# Patient Record
Sex: Male | Born: 1975
Health system: Southern US, Community
[De-identification: ages and names within clinical notes are randomized; demographics above are authoritative.]

## PROBLEM LIST (undated history)

## (undated) DIAGNOSIS — M1990 Unspecified osteoarthritis, unspecified site: Secondary | ICD-10-CM

## (undated) DIAGNOSIS — E785 Hyperlipidemia, unspecified: Secondary | ICD-10-CM

## (undated) DIAGNOSIS — I10 Essential (primary) hypertension: Secondary | ICD-10-CM

## (undated) DIAGNOSIS — G4730 Sleep apnea, unspecified: Secondary | ICD-10-CM

## (undated) DIAGNOSIS — E119 Type 2 diabetes mellitus without complications: Secondary | ICD-10-CM

## (undated) HISTORY — DX: Hyperlipidemia, unspecified: E78.5

## (undated) HISTORY — DX: Essential (primary) hypertension: I10

## (undated) HISTORY — DX: Type 2 diabetes mellitus without complications: E11.9

---

## 2004-02-15 ENCOUNTER — Emergency Department (HOSPITAL_COMMUNITY): Admission: EM | Admit: 2004-02-15 | Discharge: 2004-02-15 | Payer: Self-pay | Admitting: Family Medicine

## 2006-10-19 ENCOUNTER — Emergency Department (HOSPITAL_COMMUNITY): Admission: EM | Admit: 2006-10-19 | Discharge: 2006-10-19 | Payer: Self-pay | Admitting: Emergency Medicine

## 2007-08-04 HISTORY — PX: KNEE SURGERY: SHX244

## 2007-09-18 ENCOUNTER — Emergency Department (HOSPITAL_COMMUNITY): Admission: EM | Admit: 2007-09-18 | Discharge: 2007-09-18 | Payer: Self-pay | Admitting: Family Medicine

## 2008-01-21 ENCOUNTER — Emergency Department (HOSPITAL_COMMUNITY): Admission: EM | Admit: 2008-01-21 | Discharge: 2008-01-21 | Payer: Self-pay | Admitting: Emergency Medicine

## 2008-01-26 ENCOUNTER — Emergency Department (HOSPITAL_COMMUNITY): Admission: EM | Admit: 2008-01-26 | Discharge: 2008-01-26 | Payer: Self-pay | Admitting: Family Medicine

## 2008-10-08 ENCOUNTER — Encounter (INDEPENDENT_AMBULATORY_CARE_PROVIDER_SITE_OTHER): Payer: Self-pay | Admitting: *Deleted

## 2008-10-16 ENCOUNTER — Ambulatory Visit: Payer: Self-pay | Admitting: Family Medicine

## 2008-11-07 LAB — CONVERTED CEMR LAB
Alkaline Phosphatase: 60 units/L (ref 39–117)
BUN: 10 mg/dL (ref 6–23)
Basophils Absolute: 0 10*3/uL (ref 0.0–0.1)
Basophils Relative: 0.2 % (ref 0.0–3.0)
Bilirubin, Direct: 0 mg/dL (ref 0.0–0.3)
CO2: 31 meq/L (ref 19–32)
Calcium: 9.2 mg/dL (ref 8.4–10.5)
Chloride: 107 meq/L (ref 96–112)
Cholesterol: 183 mg/dL (ref 0–200)
Creatinine, Ser: 1 mg/dL (ref 0.4–1.5)
Eosinophils Absolute: 0.2 10*3/uL (ref 0.0–0.7)
HDL: 39.4 mg/dL (ref >39.00)
Lymphocytes Relative: 27.3 % (ref 12.0–46.0)
MCHC: 33.5 g/dL (ref 30.0–36.0)
MCV: 98.5 fL (ref 78.0–100.0)
Monocytes Absolute: 0.6 10*3/uL (ref 0.1–1.0)
Neutrophils Relative %: 64.5 % (ref 43.0–77.0)
Platelets: 223 10*3/uL (ref 150.0–400.0)
RDW: 12 % (ref 11.5–14.6)
Total Bilirubin: 0.9 mg/dL (ref 0.3–1.2)
Total CHOL/HDL Ratio: 5
Total Protein: 7.1 g/dL (ref 6.0–8.3)
Triglycerides: 82 mg/dL (ref 0.0–149.0)

## 2008-11-08 ENCOUNTER — Encounter (INDEPENDENT_AMBULATORY_CARE_PROVIDER_SITE_OTHER): Payer: Self-pay | Admitting: *Deleted

## 2009-01-22 ENCOUNTER — Ambulatory Visit: Payer: Self-pay | Admitting: Family Medicine

## 2009-01-22 DIAGNOSIS — R519 Headache, unspecified: Secondary | ICD-10-CM | POA: Insufficient documentation

## 2009-01-22 DIAGNOSIS — R51 Headache: Secondary | ICD-10-CM

## 2009-01-22 DIAGNOSIS — J018 Other acute sinusitis: Secondary | ICD-10-CM

## 2009-01-25 ENCOUNTER — Ambulatory Visit: Payer: Self-pay | Admitting: Cardiology

## 2009-01-28 ENCOUNTER — Telehealth (INDEPENDENT_AMBULATORY_CARE_PROVIDER_SITE_OTHER): Payer: Self-pay | Admitting: *Deleted

## 2009-02-22 ENCOUNTER — Telehealth (INDEPENDENT_AMBULATORY_CARE_PROVIDER_SITE_OTHER): Payer: Self-pay | Admitting: *Deleted

## 2009-03-06 ENCOUNTER — Ambulatory Visit: Payer: Self-pay | Admitting: Family Medicine

## 2009-03-06 DIAGNOSIS — I10 Essential (primary) hypertension: Secondary | ICD-10-CM | POA: Insufficient documentation

## 2009-03-06 DIAGNOSIS — G4730 Sleep apnea, unspecified: Secondary | ICD-10-CM | POA: Insufficient documentation

## 2009-03-19 ENCOUNTER — Ambulatory Visit: Payer: Self-pay | Admitting: Pulmonary Disease

## 2009-03-19 DIAGNOSIS — G4733 Obstructive sleep apnea (adult) (pediatric): Secondary | ICD-10-CM | POA: Insufficient documentation

## 2009-04-11 ENCOUNTER — Encounter: Payer: Self-pay | Admitting: Pulmonary Disease

## 2009-04-11 ENCOUNTER — Ambulatory Visit (HOSPITAL_BASED_OUTPATIENT_CLINIC_OR_DEPARTMENT_OTHER): Admission: RE | Admit: 2009-04-11 | Discharge: 2009-04-11 | Payer: Self-pay | Admitting: Pulmonary Disease

## 2009-04-24 ENCOUNTER — Ambulatory Visit: Payer: Self-pay | Admitting: Pulmonary Disease

## 2009-04-24 ENCOUNTER — Telehealth (INDEPENDENT_AMBULATORY_CARE_PROVIDER_SITE_OTHER): Payer: Self-pay | Admitting: *Deleted

## 2009-06-06 ENCOUNTER — Ambulatory Visit: Payer: Self-pay | Admitting: Family Medicine

## 2009-06-17 ENCOUNTER — Encounter: Payer: Self-pay | Admitting: Family Medicine

## 2009-06-20 ENCOUNTER — Ambulatory Visit: Payer: Self-pay | Admitting: Family Medicine

## 2009-08-22 ENCOUNTER — Ambulatory Visit: Payer: Self-pay | Admitting: Family Medicine

## 2009-08-22 DIAGNOSIS — D492 Neoplasm of unspecified behavior of bone, soft tissue, and skin: Secondary | ICD-10-CM | POA: Insufficient documentation

## 2009-09-05 ENCOUNTER — Encounter: Payer: Self-pay | Admitting: Family Medicine

## 2009-10-03 ENCOUNTER — Telehealth (INDEPENDENT_AMBULATORY_CARE_PROVIDER_SITE_OTHER): Payer: Self-pay | Admitting: *Deleted

## 2009-12-11 ENCOUNTER — Encounter: Payer: Self-pay | Admitting: Family Medicine

## 2009-12-23 ENCOUNTER — Ambulatory Visit: Payer: Self-pay | Admitting: Family Medicine

## 2010-01-06 ENCOUNTER — Ambulatory Visit: Payer: Self-pay | Admitting: Family Medicine

## 2010-04-08 ENCOUNTER — Ambulatory Visit: Payer: Self-pay | Admitting: Family Medicine

## 2010-04-08 DIAGNOSIS — Z87448 Personal history of other diseases of urinary system: Secondary | ICD-10-CM

## 2010-04-08 DIAGNOSIS — M545 Low back pain: Secondary | ICD-10-CM

## 2010-04-08 LAB — CONVERTED CEMR LAB
Bilirubin Urine: NEGATIVE
Glucose, Urine, Semiquant: NEGATIVE
Ketones, urine, test strip: NEGATIVE
Protein, U semiquant: 30
pH: 6

## 2010-04-09 ENCOUNTER — Encounter: Payer: Self-pay | Admitting: Family Medicine

## 2010-04-09 LAB — CONVERTED CEMR LAB
BUN: 13 mg/dL (ref 6–23)
Bilirubin, Direct: 0.1 mg/dL (ref 0.0–0.3)
CO2: 27 meq/L (ref 19–32)
Calcium: 9.1 mg/dL (ref 8.4–10.5)
Chloride: 110 meq/L (ref 96–112)
Cholesterol: 209 mg/dL — ABNORMAL HIGH (ref 0–200)
Creatinine, Ser: 0.9 mg/dL (ref 0.4–1.5)
Direct LDL: 132.2 mg/dL
Eosinophils Absolute: 0.3 10*3/uL (ref 0.0–0.7)
MCHC: 34.1 g/dL (ref 30.0–36.0)
MCV: 97.8 fL (ref 78.0–100.0)
Monocytes Absolute: 1 10*3/uL (ref 0.1–1.0)
Neutrophils Relative %: 58.7 % (ref 43.0–77.0)
Platelets: 226 10*3/uL (ref 150.0–400.0)
Total Bilirubin: 0.4 mg/dL (ref 0.3–1.2)
Triglycerides: 348 mg/dL — ABNORMAL HIGH (ref 0.0–149.0)
VLDL: 69.6 mg/dL — ABNORMAL HIGH (ref 0.0–40.0)
WBC: 12.3 10*3/uL — ABNORMAL HIGH (ref 4.5–10.5)

## 2010-04-22 ENCOUNTER — Ambulatory Visit: Payer: Self-pay | Admitting: Family Medicine

## 2010-04-22 LAB — CONVERTED CEMR LAB
Bilirubin Urine: NEGATIVE
Glucose, Urine, Semiquant: NEGATIVE
Ketones, urine, test strip: NEGATIVE
Specific Gravity, Urine: 1.01
pH: 5

## 2010-05-21 ENCOUNTER — Encounter: Payer: Self-pay | Admitting: Family Medicine

## 2010-07-16 ENCOUNTER — Telehealth (INDEPENDENT_AMBULATORY_CARE_PROVIDER_SITE_OTHER): Payer: Self-pay | Admitting: *Deleted

## 2010-09-03 NOTE — Medication Information (Signed)
Summary: Nonadherence with Amlodipine Besylate/[employer]  Nonadherence with Amlodipine Besylate/[employer]   Imported By: Lanelle Bal 12/19/2009 14:19:04  _____________________________________________________________________  External Attachment:    Type:   Image     Comment:   External Document

## 2010-09-03 NOTE — Assessment & Plan Note (Signed)
Summary: FOLLOW UP ON HIS BP//PH   Vital Signs:  Patient profile:   35 year old male Height:      72 inches Weight:      359 pounds Temp:     98.5 degrees F oral Pulse rate:   80 / minute Pulse rhythm:   regular BP sitting:   130 / 84  Vitals Entered By: Army Fossa CMA (August 22, 2009 12:56 PM) CC: fu bp Comments pt. complains of lump on left foot x 34yr.   History of Present Illness:  Hypertension follow-up      This is a 35 year old man who presents for Hypertension follow-up.  The patient denies lightheadedness, urinary frequency, headaches, edema, impotence, rash, and fatigue.  The patient denies the following associated symptoms: chest pain, chest pressure, exercise intolerance, dyspnea, palpitations, syncope, leg edema, and pedal edema.  Compliance with medications (by patient report) has been near 100%.  The patient reports that dietary compliance has been good.  Adjunctive measures currently used by the patient include salt restriction.    Pt also c/o lump on top of left foot that is non tender.  It has been there 6-7 months.    Current Medications (verified): 1)  Astepro 0.15 % Soln (Azelastine Hcl) .... 2 Sprays Each Nostril Once Daily 2)  Symbicort 160-4.5 Mcg/act Aero (Budesonide-Formoterol Fumarate) .... 2 Puffs Two Times A Day 3)  Proair Hfa 108 (90 Base) Mcg/act Aers (Albuterol Sulfate) .... 2 Puffs Qid As Needed 4)  Nasacort Aq 55 Mcg/act Aers (Triamcinolone Acetonide(Nasal)) .... 2 Sprays Each Nostril Once Daily 5)  Norvasc 10 Mg Tabs (Amlodipine Besylate) .Marland Kitchen.. 1 By Mouth Once Daily  Allergies (verified): No Known Drug Allergies  Past History:  Past medical, surgical, family and social histories (including risk factors) reviewed for relevance to current acute and chronic problems.  Past Medical History: Reviewed history from 03/06/2009 and no changes required. Hypertension  Past Surgical History: Reviewed history from 03/19/2009 and no changes  required. knee surgery  Family History: Reviewed history from 10/16/2008 and no changes required. Family History Diabetes 1st degree relative Family History High cholesterol Family History Hypertension  Social History: Reviewed history from 03/19/2009 and no changes required. Occupation:   Designer, [employer] Single Current Smoker Alcohol use-yes Drug use-no Regular exercise-no 2 dogs no children  Physical Exam  General:  Well-developed,well-nourished,in no acute distress; alert,appropriate and cooperative throughout examination Neck:  No deformities, masses, or tenderness noted. Lungs:  Normal respiratory effort, chest expands symmetrically. Lungs are clear to auscultation, no crackles or wheezes. Heart:  Normal rate and regular rhythm. S1 and S2 normal without gallop, murmur, click, rub or other extra sounds. Msk:  + mass top L foot non tender  Extremities:  No clubbing, cyanosis, edema, or deformity noted with normal full range of motion of all joints.   Psych:  Oriented X3 and normally interactive.     Impression & Recommendations:  Problem # 1:  HYPERTENSION (ICD-401.9)  His updated medication list for this problem includes:    Norvasc 10 Mg Tabs (Amlodipine besylate) .Marland Kitchen... 1 by mouth once daily  BP today: 130/84 Prior BP: 148/90 (06/06/2009)  Labs Reviewed: K+: 4.3 (10/16/2008) Creat: : 1.0 (10/16/2008)   Chol: 183 (10/16/2008)   HDL: 39.40 (10/16/2008)   LDL: 127 (10/16/2008)   TG: 82.0 (10/16/2008)  Problem # 2:  NEOPLASMS UNSPEC NATURE BONE SOFT TISSUE&SKIN (ICD-239.2)  Orders: Podiatry Referral (Podiatry)  Complete Medication List: 1)  Astepro 0.15 % Soln (  Azelastine hcl) .... 2 sprays each nostril once daily 2)  Symbicort 160-4.5 Mcg/act Aero (Budesonide-formoterol fumarate) .... 2 puffs two times a day 3)  Proair Hfa 108 (90 Base) Mcg/act Aers (Albuterol sulfate) .... 2 puffs qid as needed 4)  Nasacort Aq 55 Mcg/act Aers (Triamcinolone  acetonide(nasal)) .... 2 sprays each nostril once daily 5)  Norvasc 10 Mg Tabs (Amlodipine besylate) .Marland Kitchen.. 1 by mouth once daily  Patient Instructions: 1)  rto cpe FEB/March

## 2010-09-03 NOTE — Assessment & Plan Note (Signed)
Summary: side effetcs of med//lch   Vital Signs:  Patient profile:   35 year old male Height:      72 inches Weight:      365 pounds BMI:     49.68 Pulse rate:   82 / minute Pulse rhythm:   regular BP sitting:   138 / 92  (left arm) Cuff size:   large  Vitals Entered By: Army Fossa CMA (Dec 23, 2009 4:02 PM) CC: Pt here to discuss his BP meds, would like to lower dose. Was sleeping a lot having stomach issues.    History of Present Illness: Pt here c/o increased fatigue with 10mg  norvasc.  He wants to back it back down.   No other complaints.  Current Medications (verified): 1)  Astepro 0.15 % Soln (Azelastine Hcl) .... 2 Sprays Each Nostril Once Daily 2)  Proair Hfa 108 (90 Base) Mcg/act Aers (Albuterol Sulfate) .... 2 Puffs Qid As Needed 3)  Nasacort Aq 55 Mcg/act Aers (Triamcinolone Acetonide(Nasal)) .... 2 Sprays Each Nostril Once Daily 4)  Exforge 5-160 Mg Tabs (Amlodipine Besylate-Valsartan) .Marland Kitchen.. 1 By Mouth Once Daily  Allergies (verified): No Known Drug Allergies  Past History:  Past medical, surgical, family and social histories (including risk factors) reviewed for relevance to current acute and chronic problems.  Past Medical History: Reviewed history from 03/06/2009 and no changes required. Hypertension  Past Surgical History: Reviewed history from 03/19/2009 and no changes required. knee surgery  Family History: Reviewed history from 10/16/2008 and no changes required. Family History Diabetes 1st degree relative Family History High cholesterol Family History Hypertension  Social History: Reviewed history from 03/19/2009 and no changes required. Occupation:   Designer, [employer] Single Current Smoker Alcohol use-yes Drug use-no Regular exercise-no 2 dogs no children  Review of Systems      See HPI  Physical Exam  General:  Well-developed,well-nourished,in no acute distress; alert,appropriate and cooperative throughout  examination Lungs:  Normal respiratory effort, chest expands symmetrically. Lungs are clear to auscultation, no crackles or wheezes. Heart:  normal rate and no murmur.   Extremities:  No clubbing, cyanosis, edema, or deformity noted with normal full range of motion of all joints.   Psych:  Oriented X3 and normally interactive.     Impression & Recommendations:  Problem # 1:  HYPERTENSION (ICD-401.9)  His updated medication list for this problem includes:    Exforge 5-160 Mg Tabs (Amlodipine besylate-valsartan) .Marland Kitchen... 1 by mouth once daily  BP today: 138/92 Prior BP: 130/84 (08/22/2009)  Labs Reviewed: K+: 4.3 (10/16/2008) Creat: : 1.0 (10/16/2008)   Chol: 183 (10/16/2008)   HDL: 39.40 (10/16/2008)   LDL: 127 (10/16/2008)   TG: 82.0 (10/16/2008)  Complete Medication List: 1)  Astepro 0.15 % Soln (Azelastine hcl) .... 2 sprays each nostril once daily 2)  Proair Hfa 108 (90 Base) Mcg/act Aers (Albuterol sulfate) .... 2 puffs qid as needed 3)  Nasacort Aq 55 Mcg/act Aers (Triamcinolone acetonide(nasal)) .... 2 sprays each nostril once daily 4)  Exforge 5-160 Mg Tabs (Amlodipine besylate-valsartan) .Marland Kitchen.. 1 by mouth once daily  Patient Instructions: 1)  Please schedule a follow-up appointment in 2 weeks.

## 2010-09-03 NOTE — Consult Note (Signed)
Summary: [facility]  [facility]   Imported By: Lanelle Bal 06/02/2010 13:32:40  _____________________________________________________________________  External Attachment:    Type:   Image     Comment:   External Document

## 2010-09-03 NOTE — Assessment & Plan Note (Signed)
Summary: rto 2 weeks/cbs   Vital Signs:  Patient profile:   35 year old male Weight:      376.0 pounds Pulse rate:   84 / minute Pulse rhythm:   regular BP sitting:   142 / 92  (right arm) Cuff size:   large  Vitals Entered By: Almeta Monas CMA Duncan Dull) (April 22, 2010 12:57 PM) CC: 2 wk f/u   History of Present Illness: Pt here to recheck urine and bp check.  No complaints.    Current Medications (verified): 1)  Astepro 0.15 % Soln (Azelastine Hcl) .... 2 Sprays Each Nostril Once Daily 2)  Proair Hfa 108 (90 Base) Mcg/act Aers (Albuterol Sulfate) .... 2 Puffs Qid As Needed 3)  Flonase 50 Mcg/act Susp (Fluticasone Propionate) .... 2 Sprays Each Nostril Once Daily 4)  Exforge Hct 10-320-25 Mg Tabs (Amlodipine-Valsartan-Hctz) .Marland Kitchen.. 1 By Mouth Once Daily 5)  Hydrochlorothiazide 25 Mg Tabs (Hydrochlorothiazide) .Marland Kitchen.. 1 By Mouth Once Daily  Allergies (verified): No Known Drug Allergies  Past History:  Past medical, surgical, family and social histories (including risk factors) reviewed, and no changes noted (except as noted below).  Past Medical History: Reviewed history from 03/06/2009 and no changes required. Hypertension  Past Surgical History: Reviewed history from 03/19/2009 and no changes required. knee surgery  Family History: Reviewed history from 10/16/2008 and no changes required. Family History Diabetes 1st degree relative Family History High cholesterol Family History Hypertension  Social History: Reviewed history from 03/19/2009 and no changes required. Occupation:   Designer, [employer] Single Current Smoker Alcohol use-yes Drug use-no Regular exercise-no 2 dogs no children  Review of Systems      See HPI  Physical Exam  General:  Well-developed,well-nourished,in no acute distress; alert,appropriate and cooperative throughout examination Neck:  No deformities, masses, or tenderness noted. Lungs:  Normal respiratory effort, chest  expands symmetrically. Lungs are clear to auscultation, no crackles or wheezes. Heart:  normal rate and no murmur.   Extremities:  No clubbing, cyanosis, edema, or deformity noted with normal full range of motion of all joints.   Psych:  Cognition and judgment appear intact. Alert and cooperative with normal attention span and concentration. No apparent delusions, illusions, hallucinations   Impression & Recommendations:  Problem # 1:  HYPERTENSION (ICD-401.9)  take exforge with hctz until run out and then switch to exforge hct. His updated medication list for this problem includes:    Exforge Hct 10-320-25 Mg Tabs (Amlodipine-valsartan-hctz) .Marland Kitchen... 1 by mouth once daily    Hydrochlorothiazide 25 Mg Tabs (Hydrochlorothiazide) .Marland Kitchen... 1 by mouth once daily  BP today: 142/92 Prior BP: 150/96 (04/08/2010)  Labs Reviewed: K+: 4.2 (04/08/2010) Creat: : 0.9 (04/08/2010)   Chol: 209 (04/08/2010)   HDL: 35.40 (04/08/2010)   LDL: 127 (10/16/2008)   TG: 348.0 (04/08/2010)  Orders: UA Dipstick w/o Micro (manual) (45409)  Problem # 2:  HEMATURIA, HX OF (ICD-V13.09)  Orders: Urology Referral (Urology) UA Dipstick w/o Micro (manual) (81191)  Complete Medication List: 1)  Astepro 0.15 % Soln (Azelastine hcl) .... 2 sprays each nostril once daily 2)  Proair Hfa 108 (90 Base) Mcg/act Aers (Albuterol sulfate) .... 2 puffs qid as needed 3)  Flonase 50 Mcg/act Susp (Fluticasone propionate) .... 2 sprays each nostril once daily 4)  Exforge Hct 10-320-25 Mg Tabs (Amlodipine-valsartan-hctz) .Marland Kitchen.. 1 by mouth once daily 5)  Hydrochlorothiazide 25 Mg Tabs (Hydrochlorothiazide) .Marland Kitchen.. 1 by mouth once daily  Patient Instructions: 1)  Please schedule a follow-up appointment in 2 weeks.  Prescriptions: HYDROCHLOROTHIAZIDE 25 MG TABS (HYDROCHLOROTHIAZIDE) 1 by mouth once daily  #30 x 1   Entered and Authorized by:   Loreen Freud DO   Signed by:   Loreen Freud DO on 04/22/2010   Method used:   Electronically  to        [facility] [address] (retail)       [address]       [place], [place]  [address]       Ph: [phone] or [phone]       Fax: [phone]   RxID:   [id number] EXFORGE HCT 10-320-25 MG TABS (AMLODIPINE-VALSARTAN-HCTZ) 1 by mouth once daily  #90 x 2   Entered and Authorized by:   Loreen Freud DO   Signed by:   Loreen Freud DO on 04/22/2010   Method used:   Print then Give to Patient   RxID:   [id number]   Laboratory Results   Urine Tests    Routine Urinalysis   Color: yellow Appearance: Clear Glucose: negative   (Normal Range: Negative) Bilirubin: negative   (Normal Range: Negative) Ketone: negative   (Normal Range: Negative) Spec. Gravity: 1.010   (Normal Range: 1.003-1.035) Blood: trace-lysed   (Normal Range: Negative) pH: 5.0   (Normal Range: 5.0-8.0) Protein: 30   (Normal Range: Negative) Urobilinogen: 0.2   (Normal Range: 0-1) Nitrite: negative   (Normal Range: Negative) Leukocyte Esterace: negative   (Normal Range: Negative)

## 2010-09-03 NOTE — Progress Notes (Signed)
Summary: norvasc refill   Phone Note Refill Request Message from:  Fax from Pharmacy on October 03, 2009 12:38 PM  Refills Requested: Medication #1:  NORVASC 10 MG TABS 1 by mouth once daily. [facility] [phone]   Method Requested: Fax to Local Pharmacy Next Appointment Scheduled: NO APPT Initial call taken by: Barb Merino,  October 03, 2009 12:39 PM    Prescriptions: NORVASC 10 MG TABS (AMLODIPINE BESYLATE) 1 by mouth once daily  #30 x 0   Entered by:   Doristine Devoid   Authorized by:   Loreen Freud DO   Signed by:   Doristine Devoid on 10/03/2009   Method used:   Electronically to        [facility] [address] (retail)       [address]       [place], [place]  [address]       Ph: [phone] or [phone]       Fax: [phone]   RxID:   [id number] NORVASC 10 MG TABS (AMLODIPINE BESYLATE) 1 by mouth once daily  #30 x 0   Entered by:   Doristine Devoid   Authorized by:   Loreen Freud DO   Signed by:   Doristine Devoid on 10/03/2009   Method used:   Electronically to        [facility] (retail)       [address] [place], [place]  [address]       Ph: [phone]       Fax: [phone]   RxID:   [id number]

## 2010-09-03 NOTE — Assessment & Plan Note (Signed)
Summary: 3 MONTH OV--PH   Vital Signs:  Patient profile:   35 year old male Weight:      378.6 pounds Temp:     98.3 degrees F oral Pulse rate:   84 / minute Pulse rhythm:   regular BP sitting:   150 / 96  (left arm) Cuff size:   large  Vitals Entered By: Almeta Monas CMA Duncan Dull) (April 08, 2010 8:37 AM) CC: 3 mo f/u c/o pain to the left side since exercising   History of Present Illness:  Hypertension follow-up      This is a 34 year old man who presents for Hypertension follow-up.  Pt c/o L sided back pain with exercising.  The patient denies lightheadedness, urinary frequency, headaches, edema, impotence, rash, and fatigue.  The patient denies the following associated symptoms: chest pain, chest pressure, exercise intolerance, dyspnea, palpitations, syncope, leg edema, and pedal edema.  Compliance with medications (by patient report) has been near 100%.  The patient reports that dietary compliance has been good.  The patient reports exercising 3-4X per week.  Adjunctive measures currently used by the patient include salt restriction.    Current Medications (verified): 1)  Astepro 0.15 % Soln (Azelastine Hcl) .... 2 Sprays Each Nostril Once Daily 2)  Proair Hfa 108 (90 Base) Mcg/act Aers (Albuterol Sulfate) .... 2 Puffs Qid As Needed 3)  Flonase 50 Mcg/act Susp (Fluticasone Propionate) .... 2 Sprays Each Nostril Once Daily 4)  Exforge 10-320 Mg Tabs (Amlodipine Besylate-Valsartan) .Marland Kitchen.. 1 By Mouth Once Daily  Allergies (verified): No Known Drug Allergies  Past History:  Past medical, surgical, family and social histories (including risk factors) reviewed for relevance to current acute and chronic problems.  Past Medical History: Reviewed history from 03/06/2009 and no changes required. Hypertension  Past Surgical History: Reviewed history from 03/19/2009 and no changes required. knee surgery  Family History: Reviewed history from 10/16/2008 and no changes  required. Family History Diabetes 1st degree relative Family History High cholesterol Family History Hypertension  Social History: Reviewed history from 03/19/2009 and no changes required. Occupation:   Designer, [employer] Single Current Smoker Alcohol use-yes Drug use-no Regular exercise-no 2 dogs no children  Review of Systems      See HPI  Physical Exam  General:  Well-developed,well-nourished,in no acute distress; alert,appropriate and cooperative throughout examination Lungs:  Normal respiratory effort, chest expands symmetrically. Lungs are clear to auscultation, no crackles or wheezes. Heart:  normal rate and no murmur.   Extremities:  No clubbing, cyanosis, edema, or deformity noted with normal full range of motion of all joints.   Psych:  Oriented X3 and normally interactive.     Impression & Recommendations:  Problem # 1:  HYPERTENSION (ICD-401.9)  His updated medication list for this problem includes:    Exforge 10-320 Mg Tabs (Amlodipine besylate-valsartan) .Marland Kitchen... 1 by mouth once daily  BP today: 150/96 Prior BP: 140/86 (01/06/2010)  Labs Reviewed: K+: 4.3 (10/16/2008) Creat: : 1.0 (10/16/2008)   Chol: 183 (10/16/2008)   HDL: 39.40 (10/16/2008)   LDL: 127 (10/16/2008)   TG: 82.0 (10/16/2008)  Orders: Venipuncture (45409) TLB-Lipid Panel (80061-LIPID) TLB-BMP (Basic Metabolic Panel-BMET) (80048-METABOL) TLB-CBC Platelet - w/Differential (85025-CBCD) TLB-Hepatic/Liver Function Pnl (80076-HEPATIC) TLB-TSH (Thyroid Stimulating Hormone) (84443-TSH)  Problem # 2:  LOW BACK PAIN, MILD (ICD-724.2)  Discussed use of moist heat or ice, modified activities, medications, and stretching/strengthening exercises. Back care instructions given. To be seen in 2 weeks if no improvement; sooner if worsening of symptoms.  Orders: T-Culture, Urine ([address]-60[address])  Problem # 3:  HEMATURIA, HX OF (ICD-V13.09)  Orders: T-Culture, Urine  (09811-91478)  Complete Medication List: 1)  Astepro 0.15 % Soln (Azelastine hcl) .... 2 sprays each nostril once daily 2)  Proair Hfa 108 (90 Base) Mcg/act Aers (Albuterol sulfate) .... 2 puffs qid as needed 3)  Flonase 50 Mcg/act Susp (Fluticasone propionate) .... 2 sprays each nostril once daily 4)  Exforge 10-320 Mg Tabs (Amlodipine besylate-valsartan) .Marland Kitchen.. 1 by mouth once daily  Patient Instructions: 1)  Please schedule a follow-up appointment in 2 weeks.  Prescriptions: EXFORGE 10-320 MG TABS (AMLODIPINE BESYLATE-VALSARTAN) 1 by mouth once daily  #90 x 3   Entered and Authorized by:   Loreen Freud DO   Signed by:   Loreen Freud DO on 04/08/2010   Method used:   Electronically to        [facility] (retail)       [address] [place], [place]  [address]       Ph: [phone]       Fax: [phone]   RxID:   [id number]   Laboratory Results   Urine Tests    Routine Urinalysis   Color: yellow Appearance: Hazy Glucose: negative   (Normal Range: Negative) Bilirubin: negative   (Normal Range: Negative) Ketone: negative   (Normal Range: Negative) Spec. Gravity: 1.015   (Normal Range: 1.003-1.035) Blood: trace-lysed   (Normal Range: Negative) pH: 6.0   (Normal Range: 5.0-8.0) Protein: 30   (Normal Range: Negative) Urobilinogen: 1.0   (Normal Range: 0-1) Nitrite: negative   (Normal Range: Negative) Leukocyte Esterace: negative   (Normal Range: Negative)

## 2010-09-03 NOTE — Consult Note (Signed)
Summary: [facility]   Imported By: Lanelle Bal 09/13/2009 09:45:41  _____________________________________________________________________  External Attachment:    Type:   Image     Comment:   External Document

## 2010-09-03 NOTE — Assessment & Plan Note (Signed)
Summary: rto 2 weeks.cbs   Vital Signs:  Patient profile:   35 year old male Weight:      371 pounds Pulse rate:   86 / minute Pulse rhythm:   regular BP sitting:   140 / 86  (left arm) Cuff size:   large  Vitals Entered By: Army Fossa CMA (January 06, 2010 1:34 PM) CC: Pt here to follow up on BP meds.   History of Present Illness:  Hypertension follow-up      This is a 35 year old man who presents for Hypertension follow-up.  Pt with no complaints.  The patient denies lightheadedness, urinary frequency, headaches, edema, impotence, rash, and fatigue.  The patient denies the following associated symptoms: chest pain, chest pressure, exercise intolerance, dyspnea, palpitations, syncope, leg edema, and pedal edema.  Compliance with medications (by patient report) has been near 100%.  Adjunctive measures currently used by the patient include salt restriction.    Current Medications (verified): 1)  Astepro 0.15 % Soln (Azelastine Hcl) .... 2 Sprays Each Nostril Once Daily 2)  Proair Hfa 108 (90 Base) Mcg/act Aers (Albuterol Sulfate) .... 2 Puffs Qid As Needed 3)  Flonase 50 Mcg/act Susp (Fluticasone Propionate) .... 2 Sprays Each Nostril Once Daily 4)  Exforge 5-320 Mg Tabs (Amlodipine Besylate-Valsartan) .Marland Kitchen.. 1 By Mouth Once Daily  Allergies (verified): No Known Drug Allergies  Past History:  Past medical, surgical, family and social histories (including risk factors) reviewed for relevance to current acute and chronic problems.  Past Medical History: Reviewed history from 03/06/2009 and no changes required. Hypertension  Past Surgical History: Reviewed history from 03/19/2009 and no changes required. knee surgery  Family History: Reviewed history from 10/16/2008 and no changes required. Family History Diabetes 1st degree relative Family History High cholesterol Family History Hypertension  Social History: Reviewed history from 03/19/2009 and no changes  required. Occupation:   Designer, [employer] Single Current Smoker Alcohol use-yes Drug use-no Regular exercise-no 2 dogs no children  Review of Systems      See HPI  Physical Exam  General:  Well-developed,well-nourished,in no acute distress; alert,appropriate and cooperative throughout examination Psych:  Cognition and judgment appear intact. Alert and cooperative with normal attention span and concentration. No apparent delusions, illusions, hallucinations   Impression & Recommendations:  Problem # 1:  HYPERTENSION (ICD-401.9)  His updated medication list for this problem includes:    Exforge 5-320 Mg Tabs (Amlodipine besylate-valsartan) .Marland Kitchen... 1 by mouth once daily  BP today: 140/86 Prior BP: 138/92 (12/23/2009)  Labs Reviewed: K+: 4.3 (10/16/2008) Creat: : 1.0 (10/16/2008)   Chol: 183 (10/16/2008)   HDL: 39.40 (10/16/2008)   LDL: 127 (10/16/2008)   TG: 82.0 (10/16/2008)  Complete Medication List: 1)  Astepro 0.15 % Soln (Azelastine hcl) .... 2 sprays each nostril once daily 2)  Proair Hfa 108 (90 Base) Mcg/act Aers (Albuterol sulfate) .... 2 puffs qid as needed 3)  Flonase 50 Mcg/act Susp (Fluticasone propionate) .... 2 sprays each nostril once daily 4)  Exforge 5-320 Mg Tabs (Amlodipine besylate-valsartan) .Marland Kitchen.. 1 by mouth once daily Prescriptions: EXFORGE 5-320 MG TABS (AMLODIPINE BESYLATE-VALSARTAN) 1 by mouth once daily  #30 x 2   Entered and Authorized by:   Loreen Freud DO   Signed by:   Loreen Freud DO on 01/06/2010   Method used:   Print then Give to Patient   RxID:   [id number] FLONASE 50 MCG/ACT SUSP (FLUTICASONE PROPIONATE) 2 sprays each nostril once daily  #1 x 5  Entered and Authorized by:   Loreen Freud DO   Signed by:   Loreen Freud DO on 01/06/2010   Method used:   Electronically to        [facility] [address] (retail)       [address]       [place], [place]  [address]       Ph:  [phone] or [phone]       Fax: [phone]   RxID:   [id number]

## 2010-09-04 NOTE — Progress Notes (Signed)
Summary: refill  Phone Note Refill Request Message from:  Fax from Pharmacy on July 16, 2010 8:41 AM  Refills Requested: Medication #1:  ASTEPRO 0.15 % SOLN 2 SPRAYS EACH NOSTRIL once daily harri[facility] - [facility] - fax [phone]  Initial call taken by: Okey Regal [place],  July 16, 2010 8:41 AM    Prescriptions: ASTEPRO 0.15 % SOLN (AZELASTINE HCL) 2 SPRAYS EACH NOSTRIL once daily  #2 x 0   Entered by:   Jeremy Johann CMA   Authorized by:   Loreen Freud DO   Signed by:   Jeremy Johann CMA on 07/16/2010   Method used:   Faxed to ...       [facility] (retail)       [address]       [place], [place]  [address]       Ph: [phone] or [phone]       Fax: [phone]   RxID:   [id number]

## 2010-09-09 ENCOUNTER — Encounter (INDEPENDENT_AMBULATORY_CARE_PROVIDER_SITE_OTHER): Payer: Managed Care, Other (non HMO) | Admitting: Family Medicine

## 2010-09-09 ENCOUNTER — Other Ambulatory Visit: Payer: Self-pay | Admitting: Family Medicine

## 2010-09-09 ENCOUNTER — Encounter: Payer: Self-pay | Admitting: Family Medicine

## 2010-09-09 DIAGNOSIS — Z0000 Encounter for general adult medical examination without abnormal findings: Secondary | ICD-10-CM

## 2010-09-09 DIAGNOSIS — Z87448 Personal history of other diseases of urinary system: Secondary | ICD-10-CM

## 2010-09-09 DIAGNOSIS — I10 Essential (primary) hypertension: Secondary | ICD-10-CM

## 2010-09-09 DIAGNOSIS — E785 Hyperlipidemia, unspecified: Secondary | ICD-10-CM

## 2010-09-09 DIAGNOSIS — Z833 Family history of diabetes mellitus: Secondary | ICD-10-CM

## 2010-09-09 DIAGNOSIS — G4733 Obstructive sleep apnea (adult) (pediatric): Secondary | ICD-10-CM

## 2010-09-09 LAB — CBC WITH DIFFERENTIAL/PLATELET
Basophils Absolute: 0.1 10*3/uL (ref 0.0–0.1)
Eosinophils Absolute: 0.2 10*3/uL (ref 0.0–0.7)
HCT: 40.8 % (ref 39.0–52.0)
Lymphs Abs: 3 10*3/uL (ref 0.7–4.0)
Monocytes Absolute: 0.7 10*3/uL (ref 0.1–1.0)
Monocytes Relative: 7.5 % (ref 3.0–12.0)
Platelets: 221 10*3/uL (ref 150.0–400.0)
RDW: 12.3 % (ref 11.5–14.6)

## 2010-09-09 LAB — BASIC METABOLIC PANEL
BUN: 15 mg/dL (ref 6–23)
GFR: 116.43 mL/min (ref >60.00)
Glucose, Bld: 105 mg/dL — ABNORMAL HIGH (ref 70–99)
Potassium: 4.2 mEq/L (ref 3.5–5.1)

## 2010-09-09 LAB — LIPID PANEL
HDL: 38.1 mg/dL — ABNORMAL LOW (ref >39.00)
Triglycerides: 223 mg/dL — ABNORMAL HIGH (ref 0.0–149.0)

## 2010-09-09 LAB — HEPATIC FUNCTION PANEL
AST: 21 U/L (ref 0–37)
Total Bilirubin: 0.8 mg/dL (ref 0.3–1.2)

## 2010-09-09 LAB — LDL CHOLESTEROL, DIRECT: Direct LDL: 136.3 mg/dL

## 2010-09-09 LAB — TSH: TSH: 1.46 u[IU]/mL (ref 0.35–5.50)

## 2010-09-10 ENCOUNTER — Encounter: Payer: Self-pay | Admitting: Family Medicine

## 2010-09-11 LAB — CONVERTED CEMR LAB
Chlamydia, Swab/Urine, PCR: NEGATIVE
HIV: NONREACTIVE

## 2010-09-18 NOTE — Assessment & Plan Note (Signed)
Summary: cpx/wants hiv testing/kn   Vital Signs:  Patient profile:   35 year old male Height:      73 inches Weight:      373 pounds BMI:     49.39 O2 Sat:      98 % on Room air Temp:     98.1 degrees F oral Pulse rate:   63 / minute Resp:     18 per minute BP sitting:   128 / 98  (left arm)  Vitals Entered By: Jeremy Johann CMA (September 09, 2010 1:12 PM)  O2 Flow:  Room air CC: CPX, fasting, STD testing Comments No BP med x3days   History of Present Illness: Pt here for cpe and labs.  Pt would like to have STD testing done --He is having no problems.  Preventive Screening-Counseling & Management  Alcohol-Tobacco     Alcohol drinks/day: 1     Alcohol type: spirits     >5/day in last 3 mos: no     Alcohol Counseling: not indicated; use of alcohol is not excessive or problematic     Feels need to cut down: no     Feels annoyed by complaints: no     Feels guilty re: drinking: no     Needs 'eye opener' in am: no     Smoking Status: current     Smoking Cessation Counseling: yes     Smoke Cessation Stage: contemplative     Packs/Day: 10 cigs q week     Year Started: 1999     Passive Smoke Counseling: not indicated; no passive smoke exposure  Caffeine-Diet-Exercise     Caffeine use/day: rare     Caffeine Counseling: not indicated; caffeine use is not excessive or problematic     Diet Comments: on no diet     Diet Counseling: to improve diet; diet is suboptimal     Nutrition Referrals: no     Does Patient Exercise: yes     Type of exercise: walking     Exercise Counseling: to improve exercise regimen  Hep-HIV-STD-Contraception     Hepatitis Risk: risk noted     Hepatitis Risk Counseling: to avoid increased hepatitis risk     HIV Risk: no risk noted     HIV Risk Counseling: not indicated-no HIV risk noted     STD Risk: risk noted     STD Risk Counseling: to avoid increased STD risk     Contraception Counseling: not indicated; no questions/concerns expressed  Dental Visit-last 6 months no     Dental Care Counseling: to seek dental care; no dental care within six months     TSE monthly: yes     Testicular SE Education/Counseling not indicated; STE done regularly  Safety-Violence-Falls     Seat Belt Use: yes     Firearms in the Home: firearms in the home     Firearm Counseling: to practice firearm safety     Smoke Detectors: yes     Smoke Detector Counseling: no     Violence in the Home: no risk noted     Sexual Abuse: no      Sexual History:  currently monogamous.        Drug Use:  no.    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 2 Sprays Each Nostril Once Daily 2)  Proair Hfa 108 (90 Base) Mcg/act Aers (Albuterol Sulfate) .... 2 Puffs Qid As Needed 3)  Flonase 50 Mcg/act Susp (Fluticasone Propionate) .... 2 Sprays Each Nostril Once Daily 4)  Exforge Hct 10-320-25 Mg Tabs (Amlodipine-Valsartan-Hctz) .Marland Kitchen.. 1 By Mouth Once Daily 5)  Hydrochlorothiazide 25 Mg Tabs (Hydrochlorothiazide) .Marland Kitchen.. 1 By Mouth Once Daily  Current Medications (verified): 1)  Astepro 0.15 % Soln (Azelastine Hcl) .... 2 Sprays Each Nostril Once Daily 2)  Proair Hfa 108 (90 Base) Mcg/act Aers (Albuterol Sulfate) .... 2 Puffs Qid As Needed 3)  Flonase 50 Mcg/act Susp (Fluticasone Propionate) .... 2 Sprays Each Nostril Once Daily 4)  Exforge Hct 10-320-25 Mg Tabs (Amlodipine-Valsartan-Hctz) .Marland Kitchen.. 1 By Mouth Once Daily 5)  Hydrochlorothiazide 25 Mg Tabs (Hydrochlorothiazide) .Marland Kitchen.. 1 By Mouth Once  Daily  Allergies (verified): No Known Drug Allergies  Past History:  Past Medical History: Last updated: 03/06/2009 Hypertension  Past Surgical History: Last updated: 03/19/2009 knee surgery  Family History: Last updated: 10/16/2008 Family History Diabetes 1st degree relative Family History High cholesterol Family History Hypertension  Social History: Last updated: 03/19/2009 Occupation:   [employer] Single Current Smoker Alcohol use-yes Drug use-no Regular exercise-no 2 dogs no children  Risk Factors: Alcohol Use: 1 (09/09/2010) >5 drinks/d w/in last 3 months: no (09/09/2010) Caffeine Use: rare (09/09/2010) Diet: on no diet (09/09/2010) Exercise: yes (09/09/2010)  Risk Factors: Smoking Status: current (09/09/2010) Packs/Day: 10 cigs q week (09/09/2010)  Family History: Reviewed history from 10/16/2008 and no changes required. Family History Diabetes 1st degree relative Family History High cholesterol Family History Hypertension  Social History: Reviewed history from 03/19/2009 and no changes required. Occupation:   Designer, [employer] distribution [employer] Single Current Smoker Alcohol use-yes Drug use-no Regular exercise-no 2 dogs no childrenDoes Patient Exercise:  yes  Review of Systems      See HPI General:  Denies chills, fatigue, fever, loss of appetite, malaise, sleep disorder, sweats, weakness, and weight loss. Eyes:  Denies blurring, discharge, double vision, eye irritation, eye pain, halos, itching, light sensitivity, red eye, vision loss-1 eye, and vision loss-both eyes; optho q1y. ENT:  Denies decreased hearing, difficulty swallowing, ear discharge, earache, hoarseness, nasal congestion, nosebleeds, postnasal drainage, ringing in ears, sinus pressure, and sore throat. CV:  Denies bluish discoloration of lips or nails, chest pain or discomfort, difficulty breathing at night, difficulty breathing while lying down, fainting,  fatigue, leg cramps with exertion, lightheadness, near fainting, palpitations, shortness of breath with exertion, swelling of feet, swelling of hands, and weight gain. Resp:  Denies chest discomfort, chest pain with inspiration, cough, coughing up blood, excessive snoring, hypersomnolence, morning headaches, pleuritic, shortness of breath, sputum productive, and wheezing. GI:  Denies abdominal pain, bloody stools, change in bowel habits, constipation, dark tarry stools, diarrhea, excessive appetite, gas, hemorrhoids, indigestion, loss of appetite, nausea, vomiting, vomiting blood, and yellowish skin color. GU:  Denies decreased libido, discharge, dysuria, erectile dysfunction, genital sores, hematuria, incontinence, nocturia, urinary frequency, and urinary hesitancy. MS:  Denies joint pain, joint redness, joint swelling, loss of strength, low back pain, mid back pain, muscle aches, muscle , cramps, muscle weakness, stiffness, and thoracic pain. Derm:  Denies changes  in color of skin, changes in nail beds, dryness, excessive perspiration, flushing, hair loss, insect bite(s), itching, lesion(s), poor wound healing, and rash. Neuro:  Denies brief paralysis, difficulty with concentration, disturbances in coordination, falling down, headaches, inability to speak, memory loss, numbness, poor balance, seizures, sensation of room spinning, tingling, tremors, visual disturbances, and weakness. Psych:  Denies alternate hallucination ( auditory/visual), anxiety, depression, easily angered, easily tearful, irritability, mental problems, panic attacks, sense of great danger, suicidal thoughts/plans, thoughts of violence, unusual visions or sounds, and thoughts /plans of harming others. Endo:  Denies cold intolerance, excessive hunger, excessive thirst, excessive urination, heat intolerance, polyuria, and weight change. Heme:  Denies abnormal bruising, bleeding, enlarge lymph nodes, fevers, pallor, and skin  discoloration. Allergy:  Denies hives or rash, itching eyes, persistent infections, seasonal allergies, and sneezing.  Physical Exam  General:  Well-developed,well-nourished,in no acute distress; alert,appropriate and cooperative throughout examination Head:  Normocephalic and atraumatic without obvious abnormalities. No apparent alopecia or balding. Eyes:  pupils equal, pupils round, pupils reactive to light, and no injection.   Ears:  External ear exam shows no significant lesions or deformities.  Otoscopic examination reveals clear canals, tympanic membranes are intact bilaterally without bulging, retraction, inflammation or discharge. Hearing is grossly normal bilaterally. Nose:  mucosal erythema and mucosal edema.   Mouth:  Oral mucosa and oropharynx without lesions or exudates.  Teeth in good repair. Neck:  No deformities, masses, or tenderness noted. Chest Wall:  No deformities, masses, tenderness or gynecomastia noted. Lungs:  Normal respiratory effort, chest expands symmetrically. Lungs are clear to auscultation, no crackles or wheezes. Heart:  normal rate and no murmur.   Abdomen:  Bowel sounds positive,abdomen soft and non-tender without masses, organomegaly or hernias noted. Rectal:  per urology Genitalia:  per urology Prostate:  per urology Msk:  normal ROM, no joint tenderness, no joint swelling, no joint warmth, no redness over joints, no joint deformities, no joint instability, and no crepitation.   Pulses:  R posterior tibial normal, R dorsalis pedis normal, R carotid normal, L posterior tibial normal, L dorsalis pedis normal, and L carotid normal.   Extremities:  No clubbing, cyanosis, edema, or deformity noted with normal full range of motion of all joints.   Neurologic:  No cranial nerve deficits noted. Station and gait are normal. Plantar reflexes are down-going bilaterally. DTRs are symmetrical throughout. Sensory, motor and coordinative functions appear intact. Skin:   Intact without suspicious lesions or rashes Cervical Nodes:  No lymphadenopathy noted Psych:  Cognition and judgment appear intact. Alert and cooperative with normal attention span and concentration. No apparent delusions, illusions, hallucinations   Impression & Recommendations:  Problem # 1:  PREVENTIVE HEALTH CARE (ICD-V70.0)  Orders: Venipuncture (65784) TLB-Lipid Panel (80061-LIPID) TLB-BMP (Basic Metabolic Panel-BMET) (80048-METABOL) TLB-CBC Platelet - w/Differential (85025-CBCD) TLB-Hepatic/Liver Function Pnl (80076-HEPATIC) TLB-TSH (Thyroid Stimulating Hormone) (84443-TSH) T-HIV Antibody  (Reflex) ([id number]) T-RPR (Syphilis) (32440-10272) T- * Misc. Laboratory test [phone]) T-GC Probe, urine [phone]) T-Chlamydia  Probe, urine [phone]) Specimen Handling (95188)  Reviewed preventive care protocols, scheduled due services, and updated immunizations.  Problem # 2:  OBSTRUCTIVE SLEEP APNEA (ICD-327.23) pt to f/u with pulm--- he will call the office  Problem # 3:  HYPERTENSION (ICD-401.9)  His updated medication list for this problem includes:    Exforge Hct 10-320-25 Mg Tabs (Amlodipine-valsartan-hctz) .Marland Kitchen... 1 by mouth once daily    Hydrochlorothiazide 25 Mg Tabs (Hydrochlorothiazide) .Marland Kitchen... 1 by mouth once daily  Orders: Venipuncture (41660) TLB-Lipid Panel (80061-LIPID) TLB-BMP (Basic Metabolic Panel-BMET) (  80048-METABOL) TLB-CBC Platelet - w/Differential (85025-CBCD) TLB-Hepatic/Liver Function Pnl (80076-HEPATIC) TLB-TSH (Thyroid Stimulating Hormone) (84443-TSH) T-HIV Antibody  (Reflex) [phone]) T-RPR (Syphilis) (95284-13244) T- * Misc. Laboratory test [phone]) Specimen Handling (25366)  BP today: 128/98 Prior BP: 142/92 (04/22/2010)  Labs Reviewed: K+: 4.2 (04/08/2010) Creat: : 0.9 (04/08/2010)   Chol: 209 (04/08/2010)   HDL: 35.40 (04/08/2010)   LDL: 127 (10/16/2008)   TG: 348.0 (04/08/2010)  Complete Medication List: 1)  Astepro 0.15 %  Soln (Azelastine hcl) .... 2 sprays each nostril once daily 2)  Proair Hfa 108 (90 Base) Mcg/act Aers (Albuterol sulfate) .... 2 puffs qid as needed 3)  Flonase 50 Mcg/act Susp (Fluticasone propionate) .... 2 sprays each nostril once daily 4)  Exforge Hct 10-320-25 Mg Tabs (Amlodipine-valsartan-hctz) .Marland Kitchen.. 1 by mouth once daily 5)  Hydrochlorothiazide 25 Mg Tabs (Hydrochlorothiazide) .Marland Kitchen.. 1 by mouth once daily Prescriptions: HYDROCHLOROTHIAZIDE 25 MG TABS (HYDROCHLOROTHIAZIDE) 1 by mouth once daily  #100 x 1   Entered and Authorized by:   Loreen Freud DO   Signed by:   Loreen Freud DO on 09/09/2010   Method used:   Electronically to        [facility]* (retail)       [address] [place], [place]  [address]       Ph: [phone]       Fax: [phone]   RxID:   [id number] EXFORGE HCT 10-320-25 MG TABS (AMLODIPINE-VALSARTAN-HCTZ) 1 by mouth once daily  #90 x 2   Entered and Authorized by:   Loreen Freud DO   Signed by:   Loreen Freud DO on 09/09/2010   Method used:   Electronically to        [facility] (retail)       [address] [place], [place]  [address]       Ph: [phone]       Fax: [phone]   RxID:   [id number]    Orders Added: 1)  Venipuncture [85462] 2)  TLB-Lipid Panel [80061-LIPID] 3)  TLB-BMP (Basic Metabolic Panel-BMET) [80048-METABOL] 4)  TLB-CBC Platelet - w/Differential [85025-CBCD] 5)  TLB-Hepatic/Liver Function Pnl [80076-HEPATIC] 6)  TLB-TSH (Thyroid Stimulating Hormone) [84443-TSH] 7)  T-HIV Antibody  (Reflex) [70350-09381] 8)  T-RPR (Syphilis) [82993-71696] 9)  T- * Misc. Laboratory test [phone] 10)  T-GC Probe, urine [phone] 11)  T-Chlamydia  Probe, urine [phone] 12)  Specimen Handling [99000] 13)  Est. Patient 18-39 years [99395]    Flu Vaccine Next Due:  Refused  Appended Document: cpx/wants hiv  testing/kn  Laboratory Results   Urine Tests   Date/Time Reported: September 09, 2010 1:46 PM   Routine Urinalysis   Color: orange Appearance: Clear Glucose: negative   (Normal Range: Negative) Bilirubin: negative   (Normal Range: Negative) Ketone: negative   (Normal Range: Negative) Spec. Gravity: 1.020   (Normal Range: 1.003-1.035) Blood: trace-lysed   (Normal Range: Negative) pH: 6.0   (Normal Range: 5.0-8.0) Protein: trace   (Normal Range: Negative) Urobilinogen: negative   (Normal Range: 0-1) Nitrite: negative   (Normal Range: Negative) Leukocyte Esterace: negative   (Normal Range: Negative)    Comments: Floydene Flock  September 09, 2010 1:46 PM cx sent gc/chl sent

## 2010-10-10 ENCOUNTER — Telehealth (INDEPENDENT_AMBULATORY_CARE_PROVIDER_SITE_OTHER): Payer: Self-pay | Admitting: *Deleted

## 2010-10-21 NOTE — Progress Notes (Signed)
Summary: not preferred med  Phone Note Refill Request   Refills Requested: Medication #1:  CRESTOR 10 MG TABS Take 1 tab at bedtime. Prior Auth for received stating that this med is consider a step 3 medication pt must have tried and failed step 1 med in order for med to be approved. Step 1: atorvastatin, lovastatin, pravastatin, simvastatin. Pls advise on med..........Marland KitchenFelecia Deloach CMA  October 10, 2010 9:11 AM    Follow-up for Phone Call        atorvastatin 10 #30  1 by mouth at bedtime , 2 refills  Follow-up by: Loreen Freud DO,  October 12, 2010 11:23 AM    New/Updated Medications: ATORVASTATIN CALCIUM 10 MG TABS (ATORVASTATIN CALCIUM) 1 by mouth at bedtime Prescriptions: ATORVASTATIN CALCIUM 10 MG TABS (ATORVASTATIN CALCIUM) 1 by mouth at bedtime  #30 x 2   Entered by:   Almeta Monas CMA (AAMA)   Authorized by:   Loreen Freud DO   Signed by:   Almeta Monas CMA (AAMA) on 10/13/2010   Method used:   Faxed to ...       [facility]* (retail)       [address] [place], [place]  [address]       Ph: [phone]       Fax: [phone]   RxID:   [id number]

## 2011-03-10 ENCOUNTER — Encounter: Payer: Self-pay | Admitting: Family Medicine

## 2011-03-10 ENCOUNTER — Ambulatory Visit (INDEPENDENT_AMBULATORY_CARE_PROVIDER_SITE_OTHER): Payer: Managed Care, Other (non HMO) | Admitting: Family Medicine

## 2011-03-10 VITALS — BP 138/86 | HR 73 | Temp 97.0°F | Wt 385.6 lb

## 2011-03-10 DIAGNOSIS — I10 Essential (primary) hypertension: Secondary | ICD-10-CM

## 2011-03-10 DIAGNOSIS — J309 Allergic rhinitis, unspecified: Secondary | ICD-10-CM

## 2011-03-10 DIAGNOSIS — Z9109 Other allergy status, other than to drugs and biological substances: Secondary | ICD-10-CM

## 2011-03-10 DIAGNOSIS — E785 Hyperlipidemia, unspecified: Secondary | ICD-10-CM

## 2011-03-10 MED ORDER — ROSUVASTATIN CALCIUM 10 MG PO TABS: 10.0000 mg | ORAL_TABLET | Freq: Every day | ORAL | Status: DC

## 2011-03-10 NOTE — Patient Instructions (Signed)

## 2011-03-10 NOTE — Progress Notes (Signed)
  Subjective:    Patient here for follow-up of elevated blood pressure.  He is exercising 2x a week and is adherent to a low-salt diet.  Blood pressure is well controlled at home. Cardiac symptoms: none. Patient denies: chest pain, chest pressure/discomfort, claudication, dyspnea, exertional chest pressure/discomfort, fatigue, irregular heart beat, lower extremity edema, near-syncope, orthopnea, palpitations, paroxysmal nocturnal dyspnea, syncope and tachypnea. Cardiovascular risk factors: dyslipidemia, hypertension, obesity (BMI >= 30 kg/m2) and sedentary lifestyle. Use of agents associated with hypertension: none. History of target organ damage: none.  The following portions of the patient's history were reviewed and updated as appropriate: allergies, current medications, past family history, past medical history, past social history, past surgical history and problem list.  Review of Systems Pertinent items are noted in HPI.     Objective:    BP 138/86  Pulse 73  Temp(Src) 97 F (36.1 C) (Oral)  Wt 385 lb 9.6 oz (174.907 kg)  SpO2 97% General appearance: alert, cooperative, appears stated age, no distress and morbidly obese Ears: normal TM's and external ear canals both ears Nose: moderate congestion, turbinates red, swollen Throat: lips, mucosa, and tongue normal; teeth and gums normal Neck: no adenopathy, no carotid bruit, no JVD, supple, symmetrical, trachea midline and thyroid not enlarged, symmetric, no tenderness/mass/nodules Lungs: clear to auscultation bilaterally Heart: S1, S2 normal Lymph nodes: Cervical, supraclavicular, and axillary nodes normal.    Assessment:    Hypertension, normal blood pressure --slightly high today.  Pt had not taken meds yet.. Evidence of target organ damage: none.   hyperlipidemia--- con't meds--check labs Allergies--refer to allergist---con't meds Plan:    Medication: no change.

## 2011-03-18 ENCOUNTER — Other Ambulatory Visit: Payer: Self-pay | Admitting: Family Medicine

## 2011-03-18 DIAGNOSIS — E785 Hyperlipidemia, unspecified: Secondary | ICD-10-CM

## 2011-03-18 DIAGNOSIS — I10 Essential (primary) hypertension: Secondary | ICD-10-CM

## 2011-03-19 ENCOUNTER — Other Ambulatory Visit (INDEPENDENT_AMBULATORY_CARE_PROVIDER_SITE_OTHER): Payer: Managed Care, Other (non HMO)

## 2011-03-19 DIAGNOSIS — E785 Hyperlipidemia, unspecified: Secondary | ICD-10-CM

## 2011-03-19 LAB — BASIC METABOLIC PANEL
CO2: 27 mEq/L (ref 19–32)
Calcium: 8.6 mg/dL (ref 8.4–10.5)
Chloride: 103 mEq/L (ref 96–112)
Creatinine, Ser: 0.8 mg/dL (ref 0.4–1.5)
Glucose, Bld: 110 mg/dL — ABNORMAL HIGH (ref 70–99)

## 2011-03-19 LAB — HEPATIC FUNCTION PANEL
Albumin: 4.1 g/dL (ref 3.5–5.2)
Total Protein: 7.3 g/dL (ref 6.0–8.3)

## 2011-03-19 LAB — LIPID PANEL
Cholesterol: 162 mg/dL (ref 0–200)
HDL: 44.1 mg/dL (ref >39.00)
Triglycerides: 135 mg/dL (ref 0.0–149.0)

## 2011-03-19 NOTE — Progress Notes (Signed)
Labs only

## 2011-03-20 ENCOUNTER — Encounter: Payer: Self-pay | Admitting: Family Medicine

## 2011-03-20 NOTE — Progress Notes (Signed)
Labs only

## 2011-04-08 ENCOUNTER — Other Ambulatory Visit: Payer: Self-pay | Admitting: Family Medicine

## 2011-04-09 ENCOUNTER — Encounter: Payer: Self-pay | Admitting: Internal Medicine

## 2011-04-09 MED ORDER — AMLODIPINE-VALSARTAN-HCTZ 10-320-25 MG PO TABS: 1.0000 | ORAL_TABLET | Freq: Every day | ORAL | Status: DC

## 2011-04-10 ENCOUNTER — Other Ambulatory Visit: Payer: Managed Care, Other (non HMO)

## 2011-04-10 ENCOUNTER — Ambulatory Visit (INDEPENDENT_AMBULATORY_CARE_PROVIDER_SITE_OTHER): Payer: Managed Care, Other (non HMO) | Admitting: Internal Medicine

## 2011-04-10 ENCOUNTER — Encounter: Payer: Self-pay | Admitting: Internal Medicine

## 2011-04-10 VITALS — BP 142/86 | HR 109 | Ht 73.0 in | Wt 382.8 lb

## 2011-04-10 DIAGNOSIS — J309 Allergic rhinitis, unspecified: Secondary | ICD-10-CM

## 2011-04-10 DIAGNOSIS — J339 Nasal polyp, unspecified: Secondary | ICD-10-CM

## 2011-04-10 DIAGNOSIS — J3089 Other allergic rhinitis: Secondary | ICD-10-CM

## 2011-04-10 DIAGNOSIS — G4733 Obstructive sleep apnea (adult) (pediatric): Secondary | ICD-10-CM

## 2011-04-10 MED ORDER — PHENYLEPHRINE HCL 1 % NA SOLN
3.0000 [drp] | Freq: Once | NASAL | Status: AC
  Administered 2011-04-10: 3 [drp] via NASAL

## 2011-04-10 MED ORDER — METHYLPREDNISOLONE ACETATE 80 MG/ML IJ SUSP
80.0000 mg | Freq: Once | INTRAMUSCULAR | Status: AC
  Administered 2011-04-10: 80 mg via INTRAMUSCULAR

## 2011-04-10 NOTE — Assessment & Plan Note (Addendum)
He dropped the ball, never called to f/u but needs help. We will try to get his nose open and then see what treatment would be most appropriate.

## 2011-04-10 NOTE — Assessment & Plan Note (Addendum)
With polyps, but without aspirin sensitivity. Plan- neb nasal, depo 80, nasal steroid spray, Allergy Profile, then return for allergy skin testing.

## 2011-04-10 NOTE — Patient Instructions (Signed)
Neb nasal neo   Depo 80  Sample nasal steroid spray- Zonetta- 1 puff each nostril once daily at bedtime   Or Nasonex  2 puffs each nostril, once daily at bedtime  Order- lab Allergy Profile   Dx allergic rhinitis   Return for allergy skin testing- We will call you to arrange time for this.           It will be very important to stay off antihistamines for 3 days before the skin tests-- that includes allergy and sinus pills, cough meds, any headache medicine that says "night time, -PM, or that it is for sinus headache"

## 2011-04-10 NOTE — Progress Notes (Signed)
Subjective:    Patient ID: Jack Thomas, male    DOB: 1975-10-12, 35 y.o.   MRN: [id number]  HPI 04/10/11- 35 year old male former smoker referred courtesy of Dr. Laury Axon for allergy evaluation with complaint of "sinus all the time". He describes perennial nasal congestion since childhood. In rainy weather he gets a left maxillary ache. At times he can't wear his contact lenses. Much sneezing but can't blow much from his nose. Triggers include seasonal pollens rain and house dust. No ENT surgery. Current medications include antihistamine and steroid nasal sprays, Zyrtec, and and Mucinex D. Never diagnosed formally with asthma but says a metered inhaler sample help him breathe easier at one time. He reports having a sleep study ( [facility] 04/30/09- AHI 19/hr) but says he never called back to followup and was never treated. His girlfriend complains of his snoring. Denies previous allergy testing. Denies intolerance to aspirin. Family history of allergies and of heart disease. He works in an Human resources officer. He is exposed to outdoor climate and air-quality issues as well as dust within the warehouse.  Review of Systems Constitutional:   No-   weight loss, night sweats, fevers, chills, fatigue, lassitude. HEENT:   +  Headaches,No-  difficulty swallowing, tooth/dental problems, sore throat,       No-  sneezing, itching, ear ache        ,+ nasal congestion, post nasal drip,  CV:  No-   chest pain, orthopnea, PND, swelling in lower extremities, anasarca, dizziness, palpitations Resp: No-   shortness of breath with exertion or at rest.              No-   productive cough,  No non-productive cough,  No-  coughing up of blood.              No-   change in color of mucus.  No- wheezing.   Skin: No-   rash or lesions. GI:  No-   heartburn, indigestion, abdominal pain, nausea, vomiting, diarrhea,                 change in bowel habits, loss of appetite GU: No-   dysuria, change in color of  urine, no urgency or frequency.  No- flank pain. MS:  No-   joint pain or swelling.  No- decreased range of motion.  No- back pain. Neuro- grossly normal to observation, Or:  Psych:  No- change in mood or affect. No depression or anxiety.  No memory loss.      Objective:   Physical Exam General- Alert, Oriented, Affect-appropriate, Distress- none acute; obese Skin- rash-none, lesions- none, excoriation- none Lymphadenopathy- none Head- atraumatic            Eyes- Gross vision intact, PERRLA, conjunctivae clear secretions            Ears- Hearing, canals normal            Nose-  Obvious nasal congestion. Turbinate edema and significant nasal polyps., No-Septal dev, mucus,  erosion, perforation             Throat- Mallampati III , mucosa clear , drainage- none, tonsils- 3+ Neck- flexible , trachea midline, no stridor , thyroid nl, carotid no bruit Chest - symmetrical excursion , unlabored           Heart/CV- RRR , no murmur , no gallop  , no rub, nl s1 s2                           -  JVD- none , edema- none, stasis changes- none, varices- none           Lung- clear to P&A, wheeze- none, cough- none , dullness-none, rub- none           Chest wall-  Abd- tender-no, distended-no, bowel sounds-present, HSM- no Br/ Gen/ Rectal- Not done, not indicated Extrem- cyanosis- none, clubbing, none, atrophy- none, strength- nl Neuro- grossly intact to observation         Assessment & Plan:

## 2011-04-12 ENCOUNTER — Encounter: Payer: Self-pay | Admitting: Internal Medicine

## 2011-04-12 DIAGNOSIS — J3089 Other allergic rhinitis: Secondary | ICD-10-CM | POA: Insufficient documentation

## 2011-04-12 DIAGNOSIS — J339 Nasal polyp, unspecified: Secondary | ICD-10-CM | POA: Insufficient documentation

## 2011-04-12 NOTE — Assessment & Plan Note (Addendum)
Nonaspirin sensitive by history. He has not been reliable about using nasal sprays. We will give a sample of Zonetta today with depo shot . He may need surgical referral as discussed.

## 2011-04-13 LAB — ALLERGY FULL PROFILE
Allergen,Goose feathers, e70: <0.1 kU/L (ref <0.35)
Alternaria Alternata: <0.1 kU/L (ref <0.35)
Bermuda Grass: <0.1 kU/L (ref <0.35)
Candida Albicans: <0.1 kU/L (ref <0.35)
Curvularia lunata: <0.1 kU/L (ref <0.35)
Elm IgE: <0.1 kU/L (ref <0.35)
Fescue: <0.1 kU/L (ref <0.35)
G009 Red Top: <0.1 kU/L (ref <0.35)
IgE (Immunoglobulin E), Serum: 2.8 IU/mL (ref 0.0–180.0)
Lamb's Quarters: <0.1 kU/L (ref <0.35)
Oak: <0.1 kU/L (ref <0.35)
Stemphylium Botryosum: <0.1 kU/L (ref <0.35)
Timothy Grass: <0.1 kU/L (ref <0.35)

## 2011-04-17 ENCOUNTER — Ambulatory Visit (INDEPENDENT_AMBULATORY_CARE_PROVIDER_SITE_OTHER): Payer: Managed Care, Other (non HMO) | Admitting: Family Medicine

## 2011-04-17 ENCOUNTER — Encounter: Payer: Self-pay | Admitting: Family Medicine

## 2011-04-17 VITALS — BP 144/90 | HR 104 | Temp 98.6°F | Wt 378.6 lb

## 2011-04-17 DIAGNOSIS — I10 Essential (primary) hypertension: Secondary | ICD-10-CM

## 2011-04-17 DIAGNOSIS — L918 Other hypertrophic disorders of the skin: Secondary | ICD-10-CM

## 2011-04-17 DIAGNOSIS — L909 Atrophic disorder of skin, unspecified: Secondary | ICD-10-CM

## 2011-04-17 MED ORDER — METOPROLOL SUCCINATE ER 50 MG PO TB24: 50.0000 mg | ORAL_TABLET | Freq: Every day | ORAL | Status: DC

## 2011-04-17 NOTE — Progress Notes (Signed)
Addended by: Arnette Norris on: 04/17/2011 03:48 PM   Modules accepted: Orders

## 2011-04-17 NOTE — Patient Instructions (Signed)
Keep area dry 24 hours rto prn

## 2011-04-17 NOTE — Progress Notes (Signed)
  Skin Tag Removal Procedure Note  Pre-operative Diagnosis: Classic skin tags (acrochordon)  Post-operative Diagnosis: Classic skin tags (acrochordon)  Locations:L axilla middle  Indications: irritated  Anesthesia: Lidocaine 1% without epinephrine without added sodium bicarbonate  Procedure Details  The risks (including bleeding and infection) and benefits of the procedure and Written informed consent obtained. Using sterile iris scissors, multiple skin tags were snipped off at their bases after cleansing with Betadine.  Bleeding was controlled by pressure and silver nitrate  Findings: Pathognomonic benign lesions  not sent for pathological exam.  Condition: Stable  Complications: none.  Plan: 1. Instructed to keep the wounds dry and covered for 24-48h and clean thereafter. 2. Warning signs of infection were reviewed.   3. Recommended that the patient use OTC acetaminophen as needed for pain.  4. Return as needed.

## 2011-04-22 NOTE — Progress Notes (Signed)
Quick Note:  Pt aware of results-appt for 06-10-2011 for allergy skin testing ______

## 2011-05-18 ENCOUNTER — Encounter: Payer: Self-pay | Admitting: Family Medicine

## 2011-05-18 ENCOUNTER — Ambulatory Visit (INDEPENDENT_AMBULATORY_CARE_PROVIDER_SITE_OTHER): Payer: Managed Care, Other (non HMO) | Admitting: Family Medicine

## 2011-05-18 VITALS — BP 120/82 | HR 76 | Temp 98.1°F | Resp 16 | Wt 377.0 lb

## 2011-05-18 DIAGNOSIS — I10 Essential (primary) hypertension: Secondary | ICD-10-CM

## 2011-05-18 LAB — POCT URINALYSIS DIPSTICK
Glucose, UA: NEGATIVE
Leukocytes, UA: NEGATIVE
Nitrite, UA: NEGATIVE
Protein, UA: NEGATIVE
Urobilinogen, UA: 0.2

## 2011-05-18 MED ORDER — AMLODIPINE-VALSARTAN-HCTZ 10-320-25 MG PO TABS: 1.0000 | ORAL_TABLET | Freq: Every day | ORAL | Status: DC

## 2011-05-18 NOTE — Progress Notes (Signed)
  Subjective:    Patient here for follow-up of elevated blood pressure.  He is exercising and is adherent to a low-salt diet.  Blood pressure is well controlled at home. Cardiac symptoms: none. Patient denies: chest pain, chest pressure/discomfort, claudication, dyspnea, exertional chest pressure/discomfort, fatigue, irregular heart beat, lower extremity edema, near-syncope, orthopnea, palpitations, paroxysmal nocturnal dyspnea, syncope and tachypnea. Cardiovascular risk factors: dyslipidemia, hypertension, male gender, obesity (BMI >= 30 kg/m2) and sedentary lifestyle. Use of agents associated with hypertension: none. History of target organ damage: none.  The following portions of the patient's history were reviewed and updated as appropriate: allergies, current medications, past family history, past medical history, past social history, past surgical history and problem list.  Review of Systems Pertinent items are noted in HPI.     Objective:    BP 120/82  Pulse 76  Temp(Src) 98.1 F (36.7 C) (Oral)  Resp 16  Wt 377 lb (171.006 kg) General appearance: alert, cooperative, appears stated age and no distress Lungs: clear to auscultation bilaterally Chest wall: no tenderness Extremities: extremities normal, atraumatic, no cyanosis or edema    Assessment:    Hypertension, normal blood pressure . Evidence of target organ damage: none.    Plan:    Medication: no change. Dietary sodium restriction. Regular aerobic exercise. Follow up: 3 months and as needed.

## 2011-05-18 NOTE — Patient Instructions (Signed)

## 2011-05-19 LAB — BASIC METABOLIC PANEL
Calcium: 8.9 mg/dL (ref 8.4–10.5)
GFR: 104.54 mL/min (ref >60.00)
Glucose, Bld: 97 mg/dL (ref 70–99)
Potassium: 4 mEq/L (ref 3.5–5.1)
Sodium: 138 mEq/L (ref 135–145)

## 2011-05-19 LAB — HEPATIC FUNCTION PANEL
AST: 29 U/L (ref 0–37)
Albumin: 4.3 g/dL (ref 3.5–5.2)
Alkaline Phosphatase: 65 U/L (ref 39–117)
Bilirubin, Direct: 0.1 mg/dL (ref 0.0–0.3)
Total Bilirubin: 0.9 mg/dL (ref 0.3–1.2)

## 2011-05-19 LAB — LDL CHOLESTEROL, DIRECT: Direct LDL: 134.4 mg/dL

## 2011-05-19 LAB — LIPID PANEL
HDL: 42.7 mg/dL (ref >39.00)
Total CHOL/HDL Ratio: 5
VLDL: 43.8 mg/dL — ABNORMAL HIGH (ref 0.0–40.0)

## 2011-05-25 ENCOUNTER — Telehealth: Payer: Self-pay | Admitting: *Deleted

## 2011-05-25 ENCOUNTER — Encounter: Payer: Self-pay | Admitting: *Deleted

## 2011-05-25 MED ORDER — FENOFIBRATE MICRONIZED 130 MG PO CAPS: 130.0000 mg | ORAL_CAPSULE | Freq: Every day | ORAL | Status: DC

## 2011-05-25 MED ORDER — ROSUVASTATIN CALCIUM 20 MG PO TABS: 20.0000 mg | ORAL_TABLET | Freq: Every day | ORAL | Status: DC

## 2011-05-25 NOTE — Telephone Encounter (Signed)
Message copied by Verdene Rio on Mon May 25, 2011 12:54 PM ------      Message from: Lelon Perla      Created: Tue May 19, 2011  4:56 PM       Cholesterol--- LDL goal < 100,  HDL >40,  TG < 150.  Diet and exercise will increase HDL and decrease LDL and TG.  Fish,  Fish Oil, Flaxseed oil will also help increase the HDL and decrease Triglycerides.   Increase crestor 20 mg #30  1 po qhs , 2 refills  And add antara 130 mg #30  1 po qd ,   2 refills --there is a coupon in bin.   Recheck labs in 3 months.  272.4  Lipid, hep,

## 2011-05-25 NOTE — Telephone Encounter (Signed)
Discuss with patient, copy of labs and discount card mailed to Pt

## 2011-06-10 ENCOUNTER — Ambulatory Visit (INDEPENDENT_AMBULATORY_CARE_PROVIDER_SITE_OTHER): Payer: Managed Care, Other (non HMO) | Admitting: Internal Medicine

## 2011-06-10 ENCOUNTER — Encounter: Payer: Self-pay | Admitting: Internal Medicine

## 2011-06-10 VITALS — BP 150/90 | HR 104 | Ht 73.0 in | Wt 384.2 lb

## 2011-06-10 DIAGNOSIS — J339 Nasal polyp, unspecified: Secondary | ICD-10-CM

## 2011-06-10 DIAGNOSIS — J309 Allergic rhinitis, unspecified: Secondary | ICD-10-CM

## 2011-06-10 NOTE — Progress Notes (Signed)
Patient ID: Jack Thomas, male    DOB: 21-Feb-1976, 35 y.o.   MRN: [id number]  HPI 04/10/11- 35 year old male former smoker referred courtesy of Dr. Laury Axon for allergy evaluation with complaint of "sinus all the time". He describes perennial nasal congestion since childhood. In rainy weather he gets a left maxillary ache. At times he can't wear his contact lenses. Much sneezing but can't blow much from his nose. Triggers include seasonal pollens rain and house dust. No ENT surgery. Current medications include antihistamine and steroid nasal sprays, Zyrtec, and and Mucinex D. Never diagnosed formally with asthma but says a metered inhaler sample help him breathe easier at one time. He reports having a sleep study ( [facility] 04/30/09- AHI 19/hr) but says he never called back to followup and was never treated. His girlfriend complains of his snoring. Denies previous allergy testing. Denies intolerance to aspirin. Family history of allergies and of heart disease. He works in an Human resources officer. He is exposed to outdoor climate and air-quality issues as well as dust within the warehouse.  06/10/11-  35 year old male former smoker followed for Allergic rhinitis, nasal polyps, OSA, complicated by HBP, obesity Off antihistamines for allergy skin tests today. He says nasal congestion is worse off of antihistamines. He felt much better for about a half a day after last visit when we gave a nasal Neo-Synephrine inhalation and Depo-Medrol. He tried using Zonetta nasal spray, but says it burns. Allergy skin testing: Appropriate controls. Intradermal testing showed significant reactions for several molds, but otherwise all puncture and intradermal tests were negative.  Review of Systems Constitutional:   No-   weight loss, night sweats, fevers, chills, fatigue, lassitude. HEENT:   +  Headaches,No-  difficulty swallowing, tooth/dental problems, sore throat,       No-  sneezing, itching, ear ache         ,+ nasal congestion, post nasal drip,  CV:  No-   chest pain, orthopnea, PND, swelling in lower extremities, anasarca, dizziness, palpitations Resp: No-   shortness of breath with exertion or at rest.              No-   productive cough,  No non-productive cough,  No-  coughing up of blood.              No-   change in color of mucus.  No- wheezing.   Skin: No-   rash or lesions. GI:  No-   heartburn, indigestion, abdominal pain, nausea, vomiting, diarrhea,                 change in bowel habits, loss of appetite GU: No-   dysuria, change in color of urine, no urgency or frequency.  No- flank pain. MS:  No-   joint pain or swelling.  No- decreased range of motion.  No- back pain. Neuro- grossly normal to observation, Or:  Psych:  No- change in mood or affect. No depression or anxiety.  No memory loss.      Objective:   Physical Exam General- Alert, Oriented, Affect-appropriate, Distress- none acute; obese Skin- rash-none, lesions- none, excoriation- none Lymphadenopathy- none Head- atraumatic            Eyes- Gross vision intact, PERRLA, conjunctivae clear secretions            Ears-             Nose-  Obvious nasal congestion. Turbinate edema and significant nasal polyps., No-Septal  dev, mucus,  erosion, perforation             Throat- Mallampati III , mucosa clear , drainage- none, tonsils- 3+ Neck-  Chest - symmetrical excursion , unlabored           Heart/CV- RRR , no murmur , no gallop  , no rub, nl s1 s2                           - JVD- none , edema- none, stasis changes- none, varices- none           Lung- clear to P&A, wheeze- none, cough- none , dullness-none, rub- none           Chest wall-  Abd-  Br/ Gen/ Rectal- Not done, not indicated Extrem-  Neuro- grossly intact to observation

## 2011-06-10 NOTE — Patient Instructions (Signed)
Order- referral to [facility] ENT- dx nasal polyps  Sample nasonex- 2 sprays each nostril twice daily

## 2011-06-12 NOTE — Assessment & Plan Note (Signed)
I am not impressed by his skin test responses today, however he does get symptom relief from antihistamines. I think his nasal polyps for likely significant. Plan: Try stronger dose Sudafed, fluticasone nasal spray, ENT referral for evaluation of nasal polyps.

## 2011-06-12 NOTE — Assessment & Plan Note (Signed)
I discussed management with nasal steroids and potential for surgical removal with explanation that regardless of treatment, nasal polyps tend to come back.

## 2011-06-18 ENCOUNTER — Encounter: Payer: Self-pay | Admitting: Internal Medicine

## 2011-08-25 ENCOUNTER — Other Ambulatory Visit: Payer: Self-pay | Admitting: Family Medicine

## 2011-09-10 ENCOUNTER — Encounter: Payer: Self-pay | Admitting: Family Medicine

## 2011-09-10 ENCOUNTER — Ambulatory Visit (INDEPENDENT_AMBULATORY_CARE_PROVIDER_SITE_OTHER): Payer: Managed Care, Other (non HMO) | Admitting: Family Medicine

## 2011-09-10 VITALS — BP 130/84 | HR 101 | Temp 99.1°F | Wt 369.0 lb

## 2011-09-10 DIAGNOSIS — M25569 Pain in unspecified knee: Secondary | ICD-10-CM

## 2011-09-10 DIAGNOSIS — M549 Dorsalgia, unspecified: Secondary | ICD-10-CM

## 2011-09-10 DIAGNOSIS — M25561 Pain in right knee: Secondary | ICD-10-CM

## 2011-09-10 DIAGNOSIS — E785 Hyperlipidemia, unspecified: Secondary | ICD-10-CM

## 2011-09-10 DIAGNOSIS — I10 Essential (primary) hypertension: Secondary | ICD-10-CM

## 2011-09-10 MED ORDER — TRAMADOL HCL 50 MG PO TABS: 50.0000 mg | ORAL_TABLET | Freq: Three times a day (TID) | ORAL | Status: DC | PRN

## 2011-09-10 MED ORDER — CYCLOBENZAPRINE HCL 10 MG PO TABS: 10.0000 mg | ORAL_TABLET | Freq: Three times a day (TID) | ORAL | Status: AC | PRN

## 2011-09-10 NOTE — Progress Notes (Signed)
  Subjective:    Patient here for follow-up of elevated blood pressure.  He is exercising and is adherent to a low-salt diet.  Blood pressure is not controlled at home. Cardiac symptoms: none. Patient denies: chest pain, chest pressure/discomfort, claudication, dyspnea, exertional chest pressure/discomfort, fatigue, irregular heart beat, lower extremity edema, near-syncope, orthopnea, palpitations, paroxysmal nocturnal dyspnea, syncope and tachypnea. Cardiovascular risk factors: dyslipidemia, hypertension, male gender, obesity (BMI >= 30 kg/m2) and sedentary lifestyle. Use of agents associated with hypertension: none. History of target organ damage: none. Pt also c/o low back pain and knee pain after switching jobs at work.  He is on his feet much more.  He was thinking about going to chiropractor. Pt is having sinus surgery and sleep study end of the month. The following portions of the patient's history were reviewed and updated as appropriate: allergies, current medications, past family history, past medical history, past social history, past surgical history and problem list.  Review of Systems Pertinent items are noted in HPI.     Objective:    BP 130/84  Pulse 101  Temp(Src) 99.1 F (37.3 C) (Oral)  Wt 369 lb (167.377 kg)  SpO2 95% General appearance: alert, cooperative, appears stated age and no distress Neck: mild anterior cervical adenopathy and thyroid not enlarged, symmetric, no tenderness/mass/nodules Lungs: clear to auscultation bilaterally Heart: S1, S2 normal Extremities: extremities normal, atraumatic, no cyanosis or edema   neuro-- from with no pain in back                dtr = and b/l and - slr b/l  Assessment:    Hypertension, normal blood pressure . Evidence of target organ damage: none.   back pain--  Ultram , flexeril , chiropractor-pt request R knee pain Plan:    Medication: no change. Dietary sodium restriction. Regular aerobic exercise.

## 2011-09-10 NOTE — Patient Instructions (Addendum)

## 2011-09-15 ENCOUNTER — Other Ambulatory Visit (HOSPITAL_COMMUNITY): Payer: Self-pay | Admitting: Chiropractic Medicine

## 2011-09-15 ENCOUNTER — Ambulatory Visit (HOSPITAL_COMMUNITY)
Admission: RE | Admit: 2011-09-15 | Discharge: 2011-09-15 | Disposition: A | Discharge status: Home / Self Care | Payer: Managed Care, Other (non HMO) | Source: Ambulatory Visit | Attending: Chiropractic Medicine | Admitting: Chiropractic Medicine

## 2011-09-15 DIAGNOSIS — M545 Low back pain, unspecified: Secondary | ICD-10-CM | POA: Insufficient documentation

## 2011-09-15 DIAGNOSIS — R52 Pain, unspecified: Secondary | ICD-10-CM

## 2011-10-27 ENCOUNTER — Other Ambulatory Visit: Payer: Managed Care, Other (non HMO)

## 2011-11-19 ENCOUNTER — Encounter (HOSPITAL_COMMUNITY): Payer: Self-pay | Admitting: Pharmacy Technician

## 2011-11-26 NOTE — H&P (Signed)
  Jack Thomas is an 36 y.o. male.   Chief Complaint: Nasal obstruction HPI: Long history of nasal obstruction, severe bilateral nasal and sinus polyposis and chronic sinusitis. Antibiotics and steroids have not helped.  Past Medical History  Diagnosis Date  . Hypertension   . Hyperlipidemia     Past Surgical History  Procedure Date  . Knee surgery     Family History  Problem Relation Age of Onset  . Diabetes Mother   . Hyperlipidemia Father   . Hypertension Father   . Diabetes Father   . Allergies Mother    Social History:  reports that he quit smoking about 12 months ago. His smoking use included Cigars. He has never used smokeless tobacco. He reports that he drinks alcohol. He reports that he does not use illicit drugs.  Allergies: No Known Allergies  No prescriptions prior to admission    No results found for this or any previous visit (from the past 48 hour(s)). No results found.  ROS: otherwise negative  There were no vitals taken for this visit.  PHYSICAL EXAM: Overall appearance:  Healthy appearing, in no distress Head:  Normocephalic, atraumatic. Ears: External auditory canals are clear; tympanic membranes are intact and the middle ears are free of any effusion. Nose: External nose is healthy in appearance. Internal nasal exam reveals severe bilateral polypoid disease. Oral Cavity:  There are no mucosal lesions or masses identified. Oral Pharynx/Hypopharynx/Larynx: no signs of any mucosal lesions or masses identified.  Neuro:  No identifiable neurologic deficits. Neck: No palpable neck masses.  Studies Reviewed: none    Assessment/Plan Endoscopic sinus surgery.  Lyn Deemer 11/26/2011, 3:11 PM

## 2011-11-27 NOTE — Pre-Procedure Instructions (Signed)
22 Jack Thomas  11/27/2011   Your procedure is scheduled on:  Dec 03, 2010  Report to [facility] Stay [facility] at 0530 AM.  Call this number if you have problems the morning of surgery: [phone]   Remember:   Do not eat food:After Midnight.  May have clear liquids: up to 4 Hours before arrival.  Clear liquids include soda, tea, black coffee, apple or grape juice, broth.  Take these medicines the morning of surgery with A SIP OF WATER: metoprolol, zyrtec, nasal spray   Do not wear jewelry  Do not wear lotions, powders, or perfumes.   Do not bring valuables to the hospital.  Contacts, dentures or bridgework may not be worn into surgery.  Leave suitcase in the car. After surgery it may be brought to your room.  For patients admitted to the hospital, checkout time is 11:00 AM the day of discharge.   Patients discharged the day of surgery will not be allowed to drive home.  Special Instructions: CHG Shower Use Special Wash: 1/2 bottle night before surgery and 1/2 bottle morning of surgery.   Please read over the following fact sheets that you were given: Pain Booklet, Coughing and Deep Breathing, MRSA Information and Surgical Site Infection Prevention

## 2011-11-30 ENCOUNTER — Encounter (HOSPITAL_COMMUNITY)
Admission: RE | Admit: 2011-11-30 | Discharge: 2011-11-30 | Disposition: A | Discharge status: Home / Self Care | Payer: Managed Care, Other (non HMO) | Source: Ambulatory Visit | Attending: Anesthesiology | Admitting: Anesthesiology

## 2011-11-30 ENCOUNTER — Encounter (HOSPITAL_COMMUNITY): Payer: Self-pay

## 2011-11-30 ENCOUNTER — Encounter (HOSPITAL_COMMUNITY)
Admission: RE | Admit: 2011-11-30 | Discharge: 2011-11-30 | Disposition: A | Discharge status: Home / Self Care | Payer: Managed Care, Other (non HMO) | Source: Ambulatory Visit | Attending: Otolaryngology | Admitting: Otolaryngology

## 2011-11-30 HISTORY — DX: Sleep apnea, unspecified: G47.30

## 2011-11-30 HISTORY — DX: Unspecified osteoarthritis, unspecified site: M19.90

## 2011-11-30 LAB — BASIC METABOLIC PANEL
BUN: 16 mg/dL (ref 6–23)
CO2: 30 mEq/L (ref 19–32)
Chloride: 101 mEq/L (ref 96–112)
Creatinine, Ser: 1.01 mg/dL (ref 0.50–1.35)
GFR calc Af Amer: >90 mL/min (ref >90)
Potassium: 3.7 mEq/L (ref 3.5–5.1)

## 2011-11-30 LAB — CBC
HCT: 43.5 % (ref 39.0–52.0)
MCV: 95.4 fL (ref 78.0–100.0)
RBC: 4.56 MIL/uL (ref 4.22–5.81)
RDW: 12.5 % (ref 11.5–15.5)
WBC: 17.7 10*3/uL — ABNORMAL HIGH (ref 4.0–10.5)

## 2011-11-30 LAB — SURGICAL PCR SCREEN: MRSA, PCR: NEGATIVE

## 2011-11-30 NOTE — Progress Notes (Signed)
Dr. Rana Snare - Pura Spice is Primary Physician Patient does not have cardiologist. Never had a stress test, echo or cath.

## 2011-12-01 NOTE — Consult Note (Signed)
Anesthesia Chart Review:  Patient is a 36 year old male scheduled for bilateral endoscopic ethmoidectomy, polypectomy, maxillary antrostomy, and frontal sinusotomy on 12/03/11 for chronic sinusitis and polyposis not relieved by antibiotics or steroids.  Other history includes morbid obesity with BMI ~ 49, OSA, HTN, former smoker, HLD, arthritis.  Pulmonologist is Dr. Jetty Duhamel.  PCP is Dr. Loreen Freud.  Labs noted.  WBC 17.7.  By notes, he has previously been on steroids.  Currently I only see nasal steroids listed.  He is on Augmentin that was started on 11/25/11.  He was afebrile at his PAT appointment.  I called Dr. Lucky Rathke office and was told to leave a voicemail with his nurse Jasmine December.  I also faxed a copy of patient's labs to his office with confirmation.  CXR on 11/30/11 showed no active lung disease.  His last sleep study in September 2010 showed moderated OSA.  EKG on 11/30/11 showed NSR, RSR or QR pattern in V1 suggestive of RV conduction delay.  Currently there are no comparison EKGs available.  Although he does have leukocytosis with a WBC of 17K, no acute symptoms were reported to me by his PAT RN.  He was afebrile with a normal CXR on 11/30/11.  He is currently on antibiotic therapy, which he is due to stop on 12/02/11.  Anticipate patient can proceed if not acutely ill.  Would consider repeating CBC if he reports any new respiratory or other infectious symptoms on the day of surgery, otherwise defer additional lab orders, if any, to Dr. Pollyann Kennedy.  Shonna Chock, PA-C

## 2011-12-02 MED ORDER — CEFAZOLIN SODIUM-DEXTROSE 2-3 GM-% IV SOLR
2.0000 g | INTRAVENOUS | Status: AC
  Administered 2011-12-03: 2 g via INTRAVENOUS
  Filled 2011-12-02: qty 50

## 2011-12-02 MED ORDER — OXYMETAZOLINE HCL 0.05 % NA SOLN
2.0000 | NASAL | Status: DC
  Filled 2011-12-02 (×2): qty 15

## 2011-12-03 ENCOUNTER — Encounter (HOSPITAL_COMMUNITY): Payer: Self-pay | Admitting: Vascular Surgery

## 2011-12-03 ENCOUNTER — Encounter (HOSPITAL_COMMUNITY)
Admission: RE | Disposition: A | Discharge status: Home / Self Care | Payer: Self-pay | Source: Ambulatory Visit | Attending: Otolaryngology

## 2011-12-03 ENCOUNTER — Encounter (HOSPITAL_COMMUNITY): Payer: Self-pay | Admitting: *Deleted

## 2011-12-03 ENCOUNTER — Ambulatory Visit (HOSPITAL_COMMUNITY)
Admission: RE | Admit: 2011-12-03 | Discharge: 2011-12-04 | Disposition: A | Discharge status: Home / Self Care | Payer: Managed Care, Other (non HMO) | Source: Ambulatory Visit | Attending: Otolaryngology | Admitting: Otolaryngology

## 2011-12-03 ENCOUNTER — Ambulatory Visit (HOSPITAL_COMMUNITY): Payer: Managed Care, Other (non HMO) | Admitting: Vascular Surgery

## 2011-12-03 DIAGNOSIS — I10 Essential (primary) hypertension: Secondary | ICD-10-CM

## 2011-12-03 DIAGNOSIS — J342 Deviated nasal septum: Secondary | ICD-10-CM | POA: Insufficient documentation

## 2011-12-03 DIAGNOSIS — J338 Other polyp of sinus: Secondary | ICD-10-CM | POA: Insufficient documentation

## 2011-12-03 DIAGNOSIS — J343 Hypertrophy of nasal turbinates: Secondary | ICD-10-CM | POA: Insufficient documentation

## 2011-12-03 DIAGNOSIS — J329 Chronic sinusitis, unspecified: Secondary | ICD-10-CM | POA: Insufficient documentation

## 2011-12-03 DIAGNOSIS — Z01812 Encounter for preprocedural laboratory examination: Secondary | ICD-10-CM | POA: Insufficient documentation

## 2011-12-03 HISTORY — PX: NASAL SINUS SURGERY: SHX719

## 2011-12-03 HISTORY — PX: NASAL SEPTOPLASTY W/ TURBINOPLASTY: SHX2070

## 2011-12-03 SURGERY — SINUS SURGERY, ENDOSCOPIC
Anesthesia: General | Laterality: Bilateral | Wound class: Clean Contaminated

## 2011-12-03 MED ORDER — AMOXICILLIN-POT CLAVULANATE 500-125 MG PO TABS
1.0000 | ORAL_TABLET | Freq: Two times a day (BID) | ORAL | Status: DC
  Administered 2011-12-03 – 2011-12-04 (×2): 500 mg via ORAL
  Filled 2011-12-03 (×4): qty 1

## 2011-12-03 MED ORDER — AMLODIPINE BESYLATE 10 MG PO TABS
10.0000 mg | ORAL_TABLET | Freq: Every day | ORAL | Status: DC
  Administered 2011-12-03 – 2011-12-04 (×2): 10 mg via ORAL
  Filled 2011-12-03 (×2): qty 1

## 2011-12-03 MED ORDER — AMLODIPINE-VALSARTAN-HCTZ 10-320-25 MG PO TABS: 1.0000 | ORAL_TABLET | Freq: Every day | ORAL | Status: DC

## 2011-12-03 MED ORDER — SUCCINYLCHOLINE CHLORIDE 20 MG/ML IJ SOLN
INTRAMUSCULAR | Status: DC | PRN
  Administered 2011-12-03: 140 mg via INTRAVENOUS

## 2011-12-03 MED ORDER — PROMETHAZINE HCL 25 MG PO TABS
25.0000 mg | ORAL_TABLET | Freq: Four times a day (QID) | ORAL | Status: DC | PRN
  Administered 2011-12-04: 25 mg via ORAL
  Filled 2011-12-03 (×2): qty 1

## 2011-12-03 MED ORDER — FENTANYL CITRATE 0.05 MG/ML IJ SOLN: 50.0000 ug | INTRAMUSCULAR | Status: DC | PRN

## 2011-12-03 MED ORDER — SUFENTANIL CITRATE 50 MCG/ML IV SOLN
INTRAVENOUS | Status: DC | PRN
  Administered 2011-12-03: 30 ug via INTRAVENOUS

## 2011-12-03 MED ORDER — SODIUM CHLORIDE 0.9 % IR SOLN
Status: DC | PRN
  Administered 2011-12-03: 1000 mL

## 2011-12-03 MED ORDER — PROPOFOL 10 MG/ML IV EMUL
INTRAVENOUS | Status: DC | PRN
  Administered 2011-12-03: 300 mg via INTRAVENOUS

## 2011-12-03 MED ORDER — PROMETHAZINE HCL 25 MG RE SUPP: 25.0000 mg | Freq: Four times a day (QID) | RECTAL | Status: DC | PRN

## 2011-12-03 MED ORDER — HYDROCODONE-ACETAMINOPHEN 5-325 MG PO TABS
1.0000 | ORAL_TABLET | ORAL | Status: DC | PRN
  Administered 2011-12-03: 2 via ORAL
  Filled 2011-12-03 (×2): qty 1

## 2011-12-03 MED ORDER — METOPROLOL SUCCINATE ER 50 MG PO TB24
50.0000 mg | ORAL_TABLET | Freq: Every day | ORAL | Status: DC
  Administered 2011-12-03 – 2011-12-04 (×2): 50 mg via ORAL
  Filled 2011-12-03 (×2): qty 1

## 2011-12-03 MED ORDER — IBUPROFEN 100 MG/5ML PO SUSP
400.0000 mg | Freq: Four times a day (QID) | ORAL | Status: DC | PRN
  Administered 2011-12-03: 400 mg via ORAL
  Filled 2011-12-03: qty 20

## 2011-12-03 MED ORDER — HYDROCHLOROTHIAZIDE 25 MG PO TABS
25.0000 mg | ORAL_TABLET | Freq: Every day | ORAL | Status: DC
  Administered 2011-12-03 – 2011-12-04 (×2): 25 mg via ORAL
  Filled 2011-12-03 (×2): qty 1

## 2011-12-03 MED ORDER — LACTATED RINGERS IV SOLN
INTRAVENOUS | Status: DC | PRN
  Administered 2011-12-03: 07:00:00 via INTRAVENOUS

## 2011-12-03 MED ORDER — IRBESARTAN 300 MG PO TABS
300.0000 mg | ORAL_TABLET | Freq: Every day | ORAL | Status: DC
  Administered 2011-12-03 – 2011-12-04 (×2): 300 mg via ORAL
  Filled 2011-12-03 (×2): qty 1

## 2011-12-03 MED ORDER — FENOFIBRATE 54 MG PO TABS
54.0000 mg | ORAL_TABLET | Freq: Every day | ORAL | Status: DC
  Administered 2011-12-03 – 2011-12-04 (×2): 54 mg via ORAL
  Filled 2011-12-03 (×2): qty 1

## 2011-12-03 MED ORDER — CEPHALEXIN 500 MG PO CAPS: 500.0000 mg | ORAL_CAPSULE | Freq: Three times a day (TID) | ORAL | Status: AC

## 2011-12-03 MED ORDER — HYDROCODONE-ACETAMINOPHEN 7.5-500 MG PO TABS: 1.0000 | ORAL_TABLET | Freq: Four times a day (QID) | ORAL | Status: AC | PRN

## 2011-12-03 MED ORDER — HYDROMORPHONE HCL PF 1 MG/ML IJ SOLN
0.2500 mg | INTRAMUSCULAR | Status: DC | PRN
  Administered 2011-12-03: 0.5 mg via INTRAVENOUS

## 2011-12-03 MED ORDER — MIDAZOLAM HCL 5 MG/5ML IJ SOLN
INTRAMUSCULAR | Status: DC | PRN
  Administered 2011-12-03: 2 mg via INTRAVENOUS

## 2011-12-03 MED ORDER — OXYMETAZOLINE HCL 0.05 % NA SOLN
NASAL | Status: DC | PRN
  Administered 2011-12-03: 1

## 2011-12-03 MED ORDER — ATORVASTATIN CALCIUM 10 MG PO TABS
10.0000 mg | ORAL_TABLET | Freq: Every day | ORAL | Status: DC
  Administered 2011-12-03: 10 mg via ORAL
  Filled 2011-12-03 (×2): qty 1

## 2011-12-03 MED ORDER — LORAZEPAM 2 MG/ML IJ SOLN: 1.0000 mg | Freq: Once | INTRAMUSCULAR | Status: DC | PRN

## 2011-12-03 MED ORDER — ONDANSETRON HCL 4 MG/2ML IJ SOLN
INTRAMUSCULAR | Status: DC | PRN
  Administered 2011-12-03: 4 mg via INTRAVENOUS

## 2011-12-03 MED ORDER — MIDAZOLAM HCL 2 MG/2ML IJ SOLN: 1.0000 mg | INTRAMUSCULAR | Status: DC | PRN

## 2011-12-03 MED ORDER — LIDOCAINE-EPINEPHRINE 1 %-1:100000 IJ SOLN
INTRAMUSCULAR | Status: DC | PRN
  Administered 2011-12-03: 7 mL

## 2011-12-03 MED ORDER — DEXTROSE-NACL 5-0.9 % IV SOLN
INTRAVENOUS | Status: DC
  Administered 2011-12-03: 12:00:00 via INTRAVENOUS

## 2011-12-03 SURGICAL SUPPLY — 39 items
ATTRACTOMAT 16X20 MAGNETIC DRP (DRAPES) IMPLANT
BLADE RAD40 ROTATE 4M 4 5PK (BLADE) IMPLANT
BLADE RAD60 ROTATE M4 4 5PK (BLADE) IMPLANT
BLADE TRICUT ROTATE M4 4 5PK (BLADE) ×2 IMPLANT
CANISTER SUCTION 2500CC (MISCELLANEOUS) ×2 IMPLANT
CLOTH BEACON ORANGE TIMEOUT ST (SAFETY) ×2 IMPLANT
COAGULATOR SUCT SWTCH 10FR 6 (ELECTROSURGICAL) IMPLANT
CRADLE DONUT ADULT HEAD (MISCELLANEOUS) ×2 IMPLANT
DRESSING NASAL KENNEDY 3.5X.9 (MISCELLANEOUS) IMPLANT
DRESSING TELFA 8X3 (GAUZE/BANDAGES/DRESSINGS) ×2 IMPLANT
DRSG NASAL KENNEDY 3.5X.9 (MISCELLANEOUS)
DRSG NASOPORE 8CM (GAUZE/BANDAGES/DRESSINGS) ×4 IMPLANT
ELECT REM PT RETURN 9FT ADLT (ELECTROSURGICAL) ×2
ELECTRODE REM PT RTRN 9FT ADLT (ELECTROSURGICAL) ×1 IMPLANT
FILTER ARTHROSCOPY CONVERTOR (FILTER) ×2 IMPLANT
GAUZE SPONGE 2X2 8PLY STRL LF (GAUZE/BANDAGES/DRESSINGS) ×1 IMPLANT
GLOVE ECLIPSE 7.5 STRL STRAW (GLOVE) ×4 IMPLANT
GOWN STRL NON-REIN LRG LVL3 (GOWN DISPOSABLE) ×4 IMPLANT
KIT BASIN OR (CUSTOM PROCEDURE TRAY) ×2 IMPLANT
KIT ROOM TURNOVER OR (KITS) ×2 IMPLANT
NEEDLE 27GAX1X1/2 (NEEDLE) ×2 IMPLANT
NS IRRIG 1000ML POUR BTL (IV SOLUTION) ×2 IMPLANT
PAD ARMBOARD 7.5X6 YLW CONV (MISCELLANEOUS) ×4 IMPLANT
PATTIES SURGICAL .5 X3 (DISPOSABLE) ×2 IMPLANT
SHEATH ENDOSCRUB 0 DEG (SHEATH) IMPLANT
SHEATH ENDOSCRUB 30 DEG (SHEATH) IMPLANT
SPECIMEN JAR SMALL (MISCELLANEOUS) ×2 IMPLANT
SPONGE GAUZE 2X2 STER 10/PKG (GAUZE/BANDAGES/DRESSINGS) ×1
SUT CHROMIC 4 0 P 3 18 (SUTURE) ×2 IMPLANT
SUT ETHILON 3 0 PS 1 (SUTURE) IMPLANT
SUT PLAIN 4 0 ~~LOC~~ 1 (SUTURE) ×4 IMPLANT
SWAB COLLECTION DEVICE MRSA (MISCELLANEOUS) IMPLANT
SYR 50ML SLIP (SYRINGE) IMPLANT
TOWEL OR 17X24 6PK STRL BLUE (TOWEL DISPOSABLE) ×2 IMPLANT
TOWEL OR 17X26 10 PK STRL BLUE (TOWEL DISPOSABLE) ×2 IMPLANT
TRAY ENT MC OR (CUSTOM PROCEDURE TRAY) ×2 IMPLANT
TUBE ANAEROBIC SPECIMEN COL (MISCELLANEOUS) IMPLANT
TUBING EXTENTION W/L.L. (IV SETS) ×2 IMPLANT
WATER STERILE IRR 1000ML POUR (IV SOLUTION) ×2 IMPLANT

## 2011-12-03 NOTE — Preoperative (Signed)
Beta Blockers   Reason not to administer Beta Blockers:Not Applicable 

## 2011-12-03 NOTE — Anesthesia Postprocedure Evaluation (Signed)
  Anesthesia Post-op Note  Patient: Jack Thomas  Procedure(s) Performed: Procedure(s) (LRB): ENDOSCOPIC SINUS SURGERY (Bilateral) NASAL SEPTOPLASTY WITH TURBINATE REDUCTION (Bilateral)  Patient Location: PACU  Anesthesia Type: General  Level of Consciousness: awake  Airway and Oxygen Therapy: Patient Spontanous Breathing  Post-op Pain: mild  Post-op Assessment: Post-op Vital signs reviewed, Patient's Cardiovascular Status Stable, Respiratory Function Stable, Patent Airway, No signs of Nausea or vomiting and Pain level controlled  Post-op Vital Signs: stable  Complications: No apparent anesthesia complications

## 2011-12-03 NOTE — Progress Notes (Signed)
Patient ID: Jack Thomas, male   DOB: [birth year], 36 y.o.   MRN: [id number] Subjective: He is doing very well, he is able to breathe better than he has breathed in many years.  Objective: Vital signs in last 24 hours: Temp:  [97.2 F (36.2 C)-98.3 F (36.8 C)] 98.3 F (36.8 C) (05/02 1548) Pulse Rate:  [57-93] 57  (05/02 1300) Resp:  [14-24] 24  (05/02 1300) BP: (126-165)/(26-92) 130/54 mmHg (05/02 1548) SpO2:  [85 %-97 %] 97 % (05/02 1300) Weight:  [373 lb 14.4 oz (169.6 kg)] 373 lb 14.4 oz (169.6 kg) (05/02 1206) Weight change:     Intake/Output from previous day:   Intake/Output this shift: Total I/O In: 1700 [I.V.:1700] Out: 51 [Stool:1; Blood:50]  PHYSICAL EXAM: Nasal drip pad is clean and dry. Breathing is unlabored. He is awake and alert.  Lab Results: No results found for this basename: WBC:2,HGB:2,HCT:2,PLT:2 in the last 72 hours BMET No results found for this basename: NA:2,K:2,CL:2,CO2:2,GLUCOSE:2,BUN:2,CREATININE:2,CALCIUM:2 in the last 72 hours  Studies/Results: No results found.  Medications: I have reviewed the patient's current medications.  Assessment/Plan: Stable postop. Continue overnight observation. Discharge in the morning.  LOS: 0 days   Suezette Lafave 12/03/2011, 5:30 PM

## 2011-12-03 NOTE — Transfer of Care (Signed)
Immediate Anesthesia Transfer of Care Note  Patient: Jack Thomas  Procedure(s) Performed: Procedure(s) (LRB): ENDOSCOPIC SINUS SURGERY (Bilateral) NASAL SEPTOPLASTY WITH TURBINATE REDUCTION (Bilateral)  Patient Location: PACU  Anesthesia Type: General  Level of Consciousness: sedated and patient cooperative  Airway & Oxygen Therapy: Patient Spontanous Breathing and Patient connected to face mask oxygen  Post-op Assessment: Report given to PACU RN, Post -op Vital signs reviewed and stable and Patient moving all extremities X 4  Post vital signs: Reviewed and stable  Complications: No apparent anesthesia complications

## 2011-12-03 NOTE — Discharge Instructions (Addendum)
Start using saline rinses in the nose. Purchase a bulb syringe or sinus irrigator at the drug store. You may purchase saline solution or mixture. To make your own, dissolve a teaspoon of table salt in a quart of water. Room temperature is fine or you can warm it up slightly.  The should not do any heavy lifting, anything greater than 15 pounds, for 2 weeks. This includes work.

## 2011-12-03 NOTE — Interval H&P Note (Signed)
History and Physical Interval Note:  12/03/2011 7:17 AM  Jack Thomas  has presented today for surgery, with the diagnosis of CHRONIC SINUSITIS  The various methods of treatment have been discussed with the patient and family. After consideration of risks, benefits and other options for treatment, the patient has consented to  Procedure(s) (LRB): ENDOSCOPIC SINUS SURGERY (Bilateral) NASAL SEPTOPLASTY WITH TURBINATE REDUCTION (Bilateral) as a surgical intervention .  The patients' history has been reviewed, patient examined, no change in status, stable for surgery.  I have reviewed the patients' chart and labs.  Questions were answered to the patient's satisfaction.     [name]

## 2011-12-03 NOTE — Op Note (Signed)
OPERATIVE REPORT  DATE OF SURGERY: 12/03/2011  PATIENT:  Ayesha Rumpf,  36 y.o. male  PRE-OPERATIVE DIAGNOSIS:  CHRONIC SINUSITIS  POST-OPERATIVE DIAGNOSIS:  Chronic Sinusitis  PROCEDURE:  Procedure(s): ENDOSCOPIC SINUS SURGERY NASAL SEPTOPLASTY WITH TURBINATE REDUCTION  SURGEON:  Susy Frizzle, MD  ASSISTANTS: none   ANESTHESIA:   general  EBL:  50 ml  DRAINS: none   LOCAL MEDICATIONS USED:  OTHER 1% lidocaine with epinephrine solution  SPECIMEN:  Source of Specimen:  Bilateral nasal and sinus contents  COUNTS:  YES  PROCEDURE DETAILS: Patient was taken to the operating room and placed on the operating table in the supine position. Following induction of general endotracheal anesthesia, the patient was draped in a standard fashion. Afrin spray was used preoperatively in both nasal cavities. 1% Xylocaine with epinephrine was infiltrated into the polypoid masses, and then after the initial polypectomy, the middle turbinates were injected in the superior and posterior attachments, as well as the lateral nasal wall bilaterally. A total of approximately 5 or 6 cc was used.  #1 bilateral extensive endoscopic nasal polypectomy. Both nasal cavities were filled with large masses of polypoid tissue. This was removed using the microdebrider on both sides. The polyps were followed up into the middle meatus bilaterally. There were also followed into the superior meatus.  #2 bilateral endoscopic total ethmoidectomy. After the polypectomy was completed on both sides, and ethmoid dissection was accomplished. A 0 and 30 nasal endoscope was used throughout the case. The microdebrider was used initially to open the bulla and to follow the polypoid disease back into the anterior and posterior ethmoid cells. The lamina papyracea was the lateral limit of dissection and was kept intact bilaterally. The fovea was the superior limit it was also kept intact bilaterally. The ground lamella was taken down  exposing posterior cells. Every identifiable ethmoid cells filled with polypoid disease and thick inspissated mucus.  #3 bilateral endoscopic maxillary antrostomy with removal of tissue. After the initial ethmoid dissection was completed, the 30 scope and curved suction was used to inspect the area of the natural maxillary sinus ostium on both sides. Thick mucoid secretions were followed back into the sinus. The natural ostium was identified and enlarged anteriorly using backbiting forceps, and posteriorly using straight through cut forceps. The uncinate process was taken down during this part of the dissection as well. Both maxillary sinuses were filled with very thick inspissated mucus and this was suctioned out using a large curved suction. Polypoid mucosa was removed as well using angled forceps.  #4 bilateral endoscopic frontal sinusotomy. After the ethmoidectomy was completed on both sides, the frontal recess was inspected. Thick inspissated mucus was followed with curved suction back into the frontal sinuses. Significant polypoid disease was present in the frontal duct on both sides. This was removed using up-biting forceps and on the right side a giraffe forcep was used as well. Once all of the polypoid disease was removed the suction was used to suction out the thick inspissated mucus. The sinus was open and clear following this. Both sides were packed with NasalPore. The nasal cavities themselves were not packed. The nasopharynx and oropharynx were suctioned of blood and secretions. Patient was then awakened extubated and transferred to recovery in stable condition.   PATIENT DISPOSITION:  PACU - hemodynamically stable.

## 2011-12-03 NOTE — Anesthesia Preprocedure Evaluation (Addendum)
Anesthesia Evaluation  Patient identified by MRN, date of birth, ID band Patient awake    Reviewed: Allergy & Precautions, H&P , NPO status , Patient's Chart, lab work & pertinent test results  History of Anesthesia Complications Negative for: history of anesthetic complications  Airway Mallampati: III TM Distance: >3 FB Neck ROM: Full    Dental  (+) Teeth Intact and Dental Advisory Given   Pulmonary sleep apnea , former smoker   Pulmonary exam normal       Cardiovascular hypertension, Pt. on medications     Neuro/Psych  Headaches, negative psych ROS   GI/Hepatic negative GI ROS, Neg liver ROS,   Endo/Other  Morbid obesity  Renal/GU   negative genitourinary   Musculoskeletal  (+) Arthritis -, Osteoarthritis,    Abdominal (+) + obese,   Peds  Hematology negative hematology ROS (+)   Anesthesia Other Findings   Reproductive/Obstetrics                         Anesthesia Physical Anesthesia Plan  ASA: III  Anesthesia Plan: General   Post-op Pain Management:    Induction: Intravenous  Airway Management Planned: Oral ETT  Additional Equipment:   Intra-op Plan:   Post-operative Plan: Extubation in OR  Informed Consent: I have reviewed the patients History and Physical, chart, labs and discussed the procedure including the risks, benefits and alternatives for the proposed anesthesia with the patient or authorized representative who has indicated his/her understanding and acceptance.   Dental advisory given  Plan Discussed with: CRNA, Surgeon and Anesthesiologist  Anesthesia Plan Comments:        Anesthesia Quick Evaluation

## 2011-12-03 NOTE — Progress Notes (Signed)
Pt arrived from PACU, VSS, had BM.  Lethargic but follows commands; oriented. Will continue to monitor.

## 2011-12-03 NOTE — Progress Notes (Signed)
4098........afrin nasal spray used by patient..........DA

## 2011-12-04 NOTE — Progress Notes (Signed)
Patient ID: Jack Thomas, male   DOB: 1975/12/25, 36 y.o.   MRN: [id number] Is doing great, no complaints. His breathing is fantastic. No bleeding. He is awake and alert. We'll discharge home.

## 2011-12-04 NOTE — Discharge Summary (Signed)
  Physician Discharge Summary  Patient ID: Jack Thomas MRN: [id number] DOB/AGE: 34-Apr-1977 34 y.o.  Admit date: 12/03/2011 Discharge date: 12/04/2011  Admission Diagnoses: Chronic sinusitis  Discharge Diagnoses:  Active Problems:  * No active hospital problems. *    Discharged Condition: good  Hospital Course: No complications  Consults: None  Significant Diagnostic Studies: None  Treatments: surgery: Endoscopic sinus surgery  Discharge Exam: Blood pressure 117/64, pulse 77, temperature 98.2 F (36.8 C), temperature source Oral, resp. rate 24, height 6\' 1"  (1.854 m), weight 373 lb 14.4 oz (169.6 kg), SpO2 98.00%. PHYSICAL EXAM: No bleeding, no complications, awake and alert.  Disposition: Final discharge disposition not confirmed  Discharge Orders    Future Appointments: Provider: Department: Dept Phone: Center:   03/10/2012 8:15 AM Lelon Perla, DO [facility] [phone] [facility]     Future Orders Please Complete By Expires   Diet - low sodium heart healthy      Increase activity slowly      Comments:   No heavy lifting for 2 weeks.   May shower / Bathe      May walk up steps        Medication List  As of 12/04/2011  9:31 AM   STOP taking these medications         amoxicillin-clavulanate 500-125 MG per tablet      ASTEPRO 0.15 % Soln      cetirizine 10 MG tablet      fluticasone 50 MCG/ACT nasal spray      pseudoephedrine-guaifenesin 60-600 MG per tablet         TAKE these medications         Amlodipine-Valsartan-HCTZ 10-320-25 MG Tabs   Take 1 tablet by mouth daily.      cephALEXin 500 MG capsule   Commonly known as: KEFLEX   Take 1 capsule (500 mg total) by mouth 3 (three) times daily.      fenofibrate micronized 130 MG capsule   Commonly known as: ANTARA   Take 1 capsule (130 mg total) by mouth daily before breakfast.      HYDROcodone-acetaminophen 7.5-500 MG per tablet   Commonly known as: LORTAB   Take 1 tablet by mouth every 6 (six)  hours as needed for pain.      metoprolol succinate 50 MG 24 hr tablet   Commonly known as: TOPROL-XL   Take 1 tablet (50 mg total) by mouth daily.      OVER THE COUNTER MEDICATION   Place 1 drop into both eyes 2 (two) times daily as needed. For red eyes      promethazine 25 MG suppository   Commonly known as: PHENERGAN   Place 1 suppository (25 mg total) rectally every 6 (six) hours as needed for nausea.      rosuvastatin 20 MG tablet   Commonly known as: CRESTOR   Take 1 tablet (20 mg total) by mouth at bedtime.           Follow-up Information    Follow up with Serena Colonel, MD. Schedule an appointment as soon as possible for a visit on 12/07/2011.   Contact information:   [address], [address], [address] [place] [address] [phone]          Signed: Serena Colonel 12/04/2011, 9:31 AM

## 2011-12-04 NOTE — Progress Notes (Signed)
Pt d/c per MD order, prescriptions given, pt VSS, d/c instructions given, pt verbalized understanding of D/C, pt d/c home with family

## 2011-12-07 ENCOUNTER — Encounter (HOSPITAL_COMMUNITY): Payer: Self-pay | Admitting: Otolaryngology

## 2011-12-21 ENCOUNTER — Other Ambulatory Visit: Payer: Self-pay | Admitting: Family Medicine

## 2012-03-10 ENCOUNTER — Ambulatory Visit (INDEPENDENT_AMBULATORY_CARE_PROVIDER_SITE_OTHER): Payer: Managed Care, Other (non HMO) | Admitting: Family Medicine

## 2012-03-10 ENCOUNTER — Encounter: Payer: Self-pay | Admitting: Family Medicine

## 2012-03-10 VITALS — BP 138/98 | HR 83 | Temp 98.6°F | Wt 367.4 lb

## 2012-03-10 DIAGNOSIS — Z9109 Other allergy status, other than to drugs and biological substances: Secondary | ICD-10-CM

## 2012-03-10 DIAGNOSIS — I10 Essential (primary) hypertension: Secondary | ICD-10-CM

## 2012-03-10 DIAGNOSIS — E785 Hyperlipidemia, unspecified: Secondary | ICD-10-CM | POA: Insufficient documentation

## 2012-03-10 DIAGNOSIS — J309 Allergic rhinitis, unspecified: Secondary | ICD-10-CM

## 2012-03-10 LAB — BASIC METABOLIC PANEL
BUN: 17 mg/dL (ref 6–23)
Creatinine, Ser: 0.9 mg/dL (ref 0.4–1.5)
GFR: 118.2 mL/min (ref >60.00)
Glucose, Bld: 120 mg/dL — ABNORMAL HIGH (ref 70–99)
Potassium: 3.6 mEq/L (ref 3.5–5.1)

## 2012-03-10 LAB — HEPATIC FUNCTION PANEL
ALT: 17 U/L (ref 0–53)
AST: 26 U/L (ref 0–37)
Alkaline Phosphatase: 58 U/L (ref 39–117)
Bilirubin, Direct: 0 mg/dL (ref 0.0–0.3)
Total Bilirubin: 0.6 mg/dL (ref 0.3–1.2)
Total Protein: 7.3 g/dL (ref 6.0–8.3)

## 2012-03-10 MED ORDER — FLUTICASONE PROPIONATE 50 MCG/ACT NA SUSP: 2.0000 | Freq: Every day | NASAL | Status: DC

## 2012-03-10 MED ORDER — METOPROLOL SUCCINATE ER 100 MG PO TB24: 100.0000 mg | ORAL_TABLET | Freq: Every day | ORAL | Status: DC

## 2012-03-10 NOTE — Assessment & Plan Note (Signed)
con't meds  Check labs 

## 2012-03-10 NOTE — Patient Instructions (Addendum)
Cholesterol Control Diet  Cholesterol levels in your body are determined significantly by your diet. Cholesterol levels may also be related to heart disease. The following material helps to explain this relationship and discusses what you can do to help keep your heart healthy. Not all cholesterol is bad. Low-density lipoprotein (LDL) cholesterol is the "bad" cholesterol. It may cause fatty deposits to build up inside your arteries. High-density lipoprotein (HDL) cholesterol is "good." It helps to remove the "bad" LDL cholesterol from your blood. Cholesterol is a very important risk factor for heart disease. Other risk factors are high blood pressure, smoking, stress, heredity, and weight.  The heart muscle gets its supply of blood through the coronary arteries. If your LDL cholesterol is high and your HDL cholesterol is low, you are at risk for having fatty deposits build up in your coronary arteries. This leaves less room through which blood can flow. Without sufficient blood and oxygen, the heart muscle cannot function properly and you may feel chest pains (angina pectoris). When a coronary artery closes up entirely, a part of the heart muscle may die, causing a heart attack (myocardial infarction).  CHECKING CHOLESTEROL  When your caregiver sends your blood to a lab to be analyzed for cholesterol, a complete lipid (fat) profile may be done. With this test, the total amount of cholesterol and levels of LDL and HDL are determined. Triglycerides are a type of fat that circulates in the blood and can also be used to determine heart disease risk. The list below describes what the numbers should be:  Test: Total Cholesterol.   Less than 200 mg/dl.   Test: LDL "bad cholesterol."   Less than 100 mg/dl.    Less than 70 mg/dl if you are at very high risk of a heart attack or sudden cardiac death.   Test: HDL "good cholesterol."   Greater than 50 mg/dl for women.    Greater than 40 mg/dl for men.    Test: Triglycerides.   Less than 150 mg/dl.   CONTROLLING CHOLESTEROL WITH DIET  Although exercise and lifestyle factors are important, your diet is key. That is because certain foods are known to raise cholesterol and others to lower it. The goal is to balance foods for their effect on cholesterol and more importantly, to replace saturated and trans fat with other types of fat, such as monounsaturated fat, polyunsaturated fat, and omega-3 fatty acids.  On average, a person should consume no more than 15 to 17 g of saturated fat daily. Saturated and trans fats are considered "bad" fats, and they will raise LDL cholesterol. Saturated fats are primarily found in animal products such as meats, butter, and cream. However, that does not mean you need to sacrifice all your favorite foods. Today, there are good tasting, low-fat, low-cholesterol substitutes for most of the things you like to eat. Choose low-fat or nonfat alternatives. Choose round or loin cuts of red meat, since these types of cuts are lowest in fat and cholesterol. Chicken (without the skin), fish, veal, and ground turkey breast are excellent choices. Eliminate fatty meats, such as hot dogs and salami. Even shellfish have little or no saturated fat. Have a 3 oz (85 g) portion when you eat lean meat, poultry, or fish.  Trans fats are also called "partially hydrogenated oils." They are oils that have been scientifically manipulated so that they are solid at room temperature resulting in a longer shelf life and improved taste and texture of foods in which they are   added. Trans fats are found in stick margarine, some tub margarines, cookies, crackers, and baked goods.    When baking and cooking, oils are an excellent substitute for butter. The monounsaturated oils are especially beneficial since it is believed they lower LDL and raise HDL. The oils you should avoid entirely are saturated tropical oils, such as coconut and palm.     Remember to eat liberally from food groups that are naturally free of saturated and trans fat, including fish, fruit, vegetables, beans, grains (barley, rice, couscous, bulgur wheat), and pasta (without cream sauces).    IDENTIFYING FOODS THAT LOWER CHOLESTEROL    Soluble fiber may lower your cholesterol. This type of fiber is found in fruits such as apples, vegetables such as broccoli, potatoes, and carrots, legumes such as beans, peas, and lentils, and grains such as barley. Foods fortified with plant sterols (phytosterol) may also lower cholesterol. You should eat at least 2 g per day of these foods for a cholesterol lowering effect.    Read package labels to identify low-saturated fats, trans fats free, and low-fat foods at the supermarket. Select cheeses that have only 2 to 3 g saturated fat per ounce. Use a heart-healthy tub margarine that is free of trans fats or partially hydrogenated oil. When buying baked goods (cookies, crackers), avoid partially hydrogenated oils. Breads and muffins should be made from whole grains (whole-wheat or whole oat flour, instead of "flour" or "enriched flour"). Buy non-creamy canned soups with reduced salt and no added fats.    FOOD PREPARATION TECHNIQUES    Never deep-fry. If you must fry, either stir-fry, which uses very little fat, or use non-stick cooking sprays. When possible, broil, bake, or roast meats, and steam vegetables. Instead of dressing vegetables with butter or margarine, use lemon and herbs, applesauce and cinnamon (for squash and sweet potatoes), nonfat yogurt, salsa, and low-fat dressings for salads.    LOW-SATURATED FAT / LOW-FAT FOOD SUBSTITUTES  Meats / Saturated Fat (g)   Avoid: Steak, marbled (3 oz/85 g) / 11 g    Choose: Steak, lean (3 oz/85 g) / 4 g    Avoid: Hamburger (3 oz/85 g) / 7 g    Choose: Hamburger, lean (3 oz/85 g) / 5 g    Avoid: Ham (3 oz/85 g) / 6 g    Choose: Ham, lean cut (3 oz/85 g) / 2.4 g     Avoid: Chicken, with skin, dark meat (3 oz/85 g) / 4 g    Choose: Chicken, skin removed, dark meat (3 oz/85 g) / 2 g    Avoid: Chicken, with skin, light meat (3 oz/85 g) / 2.5 g    Choose: Chicken, skin removed, light meat (3 oz/85 g) / 1 g   Dairy / Saturated Fat (g)   Avoid: Whole milk (1 cup) / 5 g    Choose: Low-fat milk, 2% (1 cup) / 3 g    Choose: Low-fat milk, 1% (1 cup) / 1.5 g    Choose: Skim milk (1 cup) / 0.3 g    Avoid: Hard cheese (1 oz/28 g) / 6 g    Choose: Skim milk cheese (1 oz/28 g) / 2 to 3 g    Avoid: Cottage cheese, 4% fat (1 cup) / 6.5 g    Choose: Low-fat cottage cheese, 1% fat (1 cup) / 1.5 g    Avoid: Ice cream (1 cup) / 9 g    Choose: Sherbet (1 cup) / 2.5 g      Choose: Nonfat frozen yogurt (1 cup) / 0.3 g    Choose: Frozen fruit bar / trace    Avoid: Whipped cream (1 tbs) / 3.5 g    Choose: Nondairy whipped topping (1 tbs) / 1 g   Condiments / Saturated Fat (g)   Avoid: Mayonnaise (1 tbs) / 2 g    Choose: Low-fat mayonnaise (1 tbs) / 1 g    Avoid: Butter (1 tbs) / 7 g    Choose: Extra light margarine (1 tbs) / 1 g    Avoid: Coconut oil (1 tbs) / 11.8 g    Choose: Olive oil (1 tbs) / 1.8 g    Choose: Corn oil (1 tbs) / 1.7 g    Choose: Safflower oil (1 tbs) / 1.2 g    Choose: Sunflower oil (1 tbs) / 1.4 g    Choose: Soybean oil (1 tbs) / 2.4 g    Choose: Canola oil (1 tbs) / 1 g   Document Released: 07/20/2005 Document Revised: 07/09/2011 Document Reviewed: 01/08/2011  Exit[employer] Patient Information 2012 [employer].

## 2012-03-10 NOTE — Addendum Note (Signed)
Addended by: Silvio Pate D on: 03/10/2012 09:08 AM   Modules accepted: Orders

## 2012-03-10 NOTE — Progress Notes (Signed)
  Subjective:    Patient here for follow-up of elevated blood pressure.  He is not exercising and is adherent to a low-salt diet.  Blood pressure is not well controlled at home. Cardiac symptoms: none. Patient denies: chest pain, chest pressure/discomfort, claudication, dyspnea, exertional chest pressure/discomfort, fatigue, irregular heart beat, lower extremity edema, near-syncope, orthopnea, palpitations, paroxysmal nocturnal dyspnea, syncope and tachypnea. Cardiovascular risk factors: dyslipidemia, hypertension, male gender, obesity (BMI >= 30 kg/m2) and sedentary lifestyle. Use of agents associated with hypertension: none. History of target organ damage: none.  The following portions of the patient's history were reviewed and updated as appropriate: allergies, current medications, past family history, past medical history, past social history, past surgical history and problem list.  Review of Systems Pertinent items are noted in HPI.     Objective:    BP 138/98  Pulse 83  Temp 98.6 F (37 C) (Oral)  Wt 367 lb 6.4 oz (166.652 kg)  SpO2 95% General appearance: alert, cooperative, appears stated age and no distress Neck: no adenopathy, no carotid bruit, no JVD, supple, symmetrical, trachea midline and thyroid not enlarged, symmetric, no tenderness/mass/nodules Lungs: clear to auscultation bilaterally Heart: S1, S2 normal Extremities: extremities normal, atraumatic, no cyanosis or edema    Assessment:    Hypertension, stage 1 . Evidence of target organ damage: none.    Plan:    Medication: increase to toprol 100 mg. Dietary sodium restriction. Regular aerobic exercise. Check blood pressures 2-3 times weekly and record. Follow up: 3 weeks and as needed.

## 2012-03-21 ENCOUNTER — Telehealth: Payer: Self-pay | Admitting: Family Medicine

## 2012-03-21 MED ORDER — ATORVASTATIN CALCIUM 20 MG PO TABS: 20.0000 mg | ORAL_TABLET | Freq: Every day | ORAL | Status: DC

## 2012-03-21 NOTE — Telephone Encounter (Signed)
Caller: Tyhir/Patient; Patient Name: Jack Thomas; PCP: Lelon Perla.; Best Callback Phone Number: [phone]. Patient was returning Kimberly's call from earlier. After looking in EPIC, she was calling to give results and orders for the patient. Orders given and Lipitor 20mg  1 tablet by mouth before bedtime called in to [facility]. Dispensed 30 pills with 2 refills. Explained diet and exercise that would be beneficial for patient also.

## 2012-03-21 NOTE — Telephone Encounter (Signed)
Noted. med list updated.

## 2012-03-31 ENCOUNTER — Encounter: Payer: Self-pay | Admitting: Family Medicine

## 2012-03-31 ENCOUNTER — Ambulatory Visit (INDEPENDENT_AMBULATORY_CARE_PROVIDER_SITE_OTHER): Payer: Managed Care, Other (non HMO) | Admitting: Family Medicine

## 2012-03-31 VITALS — BP 122/84 | HR 72 | Temp 98.9°F | Wt 363.8 lb

## 2012-03-31 DIAGNOSIS — R7309 Other abnormal glucose: Secondary | ICD-10-CM

## 2012-03-31 DIAGNOSIS — I10 Essential (primary) hypertension: Secondary | ICD-10-CM

## 2012-03-31 DIAGNOSIS — R739 Hyperglycemia, unspecified: Secondary | ICD-10-CM

## 2012-03-31 DIAGNOSIS — E785 Hyperlipidemia, unspecified: Secondary | ICD-10-CM

## 2012-03-31 MED ORDER — FISH OIL 1200 MG PO CAPS: ORAL_CAPSULE | ORAL | Status: DC

## 2012-03-31 NOTE — Assessment & Plan Note (Signed)
Check labs 

## 2012-03-31 NOTE — Progress Notes (Signed)
  Subjective:    Patient here for follow-up of elevated blood pressure.  He is exercising and is adherent to a low-salt diet.  Blood pressure is well controlled at home. Cardiac symptoms: none. Patient denies: chest pain, chest pressure/discomfort, claudication, dyspnea, exertional chest pressure/discomfort, fatigue, irregular heart beat, lower extremity edema, near-syncope, orthopnea, palpitations, paroxysmal nocturnal dyspnea, syncope and tachypnea. Cardiovascular risk factors: dyslipidemia, hypertension, male gender and obesity (BMI >= 30 kg/m2). Use of agents associated with hypertension: none. History of target organ damage: none.  The following portions of the patient's history were reviewed and updated as appropriate: allergies, current medications, past family history, past medical history, past social history, past surgical history and problem list.  Review of Systems Pertinent items are noted in HPI.     Objective:    BP 122/84  Pulse 72  Temp 98.9 F (37.2 C) (Oral)  Wt 363 lb 12.8 oz (165.019 kg)  SpO2 95% General appearance: alert, cooperative, appears stated age and no distress Lungs: clear to auscultation bilaterally Heart: regular rate and rhythm, S1, S2 normal, no murmur, click, rub or gallop Extremities: extremities normal, atraumatic, no cyanosis or edema    Assessment:    Hypertension, normal blood pressure . Evidence of target organ damage: none.    Plan:    Medication: no change. Dietary sodium restriction. Regular aerobic exercise. Follow up: 6 months and as needed.

## 2012-03-31 NOTE — Patient Instructions (Signed)
Hyperglycemia  Hyperglycemia occurs when the glucose (sugar) in your blood is too high. Hyperglycemia can happen for many reasons, but it most often happens to people who do not know they have diabetes or are not managing their diabetes properly.   CAUSES   Whether you have diabetes or not, there are other causes of hyperglycemia. Hyperglycemia can occur when you have diabetes, but it can also occur in other situations that you might not be as aware of, such as:  Diabetes  · If you have diabetes and are having problems controlling your blood glucose, hyperglycemia could occur because of some of the following reasons:  · Not following your meal plan.  · Not taking your diabetes medications or not taking it properly.  · Exercising less or doing less activity than you normally do.  · Being sick.  Pre-diabetes  · This cannot be ignored. Before people develop Type 2 diabetes, they almost always have "pre-diabetes." This is when your blood glucose levels are higher than normal, but not yet high enough to be diagnosed as diabetes. Research has shown that some long-term damage to the body, especially the heart and circulatory system, may already be occurring during pre-diabetes. If you take action to manage your blood glucose when you have pre-diabetes, you may delay or prevent Type 2 diabetes from developing.  Stress  · If you have diabetes, you may be "diet" controlled or on oral medications or insulin to control your diabetes. However, you may find that your blood glucose is higher than usual in the hospital whether you have diabetes or not. This is often referred to as "stress hyperglycemia." Stress can elevate your blood glucose. This happens because of hormones put out by the body during times of stress. If stress has been the cause of your high blood glucose, it can be followed regularly by your caregiver. That way he/she can make sure your hyperglycemia does not continue to get worse or progress to  diabetes.  Steroids  · Steroids are medications that act on the infection fighting system (immune system) to block inflammation or infection. One side effect can be a rise in blood glucose. Most people can produce enough extra insulin to allow for this rise, but for those who cannot, steroids make blood glucose levels go even higher. It is not unusual for steroid treatments to "uncover" diabetes that is developing. It is not always possible to determine if the hyperglycemia will go away after the steroids are stopped. A special blood test called an A1c is sometimes done to determine if your blood glucose was elevated before the steroids were started.  SYMPTOMS  · Thirsty.  · Frequent urination.  · Dry mouth.  · Blurred vision.  · Tired or fatigue.  · Weakness.  · Sleepy.  · Tingling in feet or leg.  DIAGNOSIS   Diagnosis is made by monitoring blood glucose in one or all of the following ways:  · A1c test. This is a chemical found in your blood.  · Fingerstick blood glucose monitoring.  · Laboratory results.  TREATMENT   First, knowing the cause of the hyperglycemia is important before the hyperglycemia can be treated. Treatment may include, but is not be limited to:  · Education.  · Change or adjustment in medications.  · Change or adjustment in meal plan.  · Treatment for an illness, infection, etc.  · More frequent blood glucose monitoring.  · Change in exercise plan.  · Decreasing or stopping steroids.  ·   Lifestyle changes.  HOME CARE INSTRUCTIONS   · Test your blood glucose as directed.  · Exercise regularly. Your caregiver will give you instructions about exercise. Pre-diabetes or diabetes which comes on with stress is helped by exercising.  · Eat wholesome, balanced meals. Eat often and at regular, fixed times. Your caregiver or nutritionist will give you a meal plan to guide your sugar intake.  · Being at an ideal weight is important. If needed, losing as little as 10 to 15 pounds may help improve blood  glucose levels.  SEEK MEDICAL CARE IF:   · You have questions about medicine, activity, or diet.  · You continue to have symptoms (problems such as increased thirst, urination, or weight gain).  SEEK IMMEDIATE MEDICAL CARE IF:   · You are vomiting or have diarrhea.  · Your breath smells fruity.  · You are breathing faster or slower.  · You are very sleepy or incoherent.  · You have numbness, tingling, or pain in your feet or hands.  · You have chest pain.  · Your symptoms get worse even though you have been following your caregiver's orders.  · If you have any other questions or concerns.  Document Released: 01/13/2001 Document Revised: 07/09/2011 Document Reviewed: 03/11/2009  Exit[employer]® Patient Information ©2012 [employer].

## 2012-07-08 ENCOUNTER — Ambulatory Visit: Payer: Managed Care, Other (non HMO) | Admitting: Family Medicine

## 2012-07-11 ENCOUNTER — Ambulatory Visit: Payer: Managed Care, Other (non HMO) | Admitting: Family Medicine

## 2012-07-11 DIAGNOSIS — Z0289 Encounter for other administrative examinations: Secondary | ICD-10-CM

## 2012-08-04 ENCOUNTER — Ambulatory Visit (INDEPENDENT_AMBULATORY_CARE_PROVIDER_SITE_OTHER): Payer: Managed Care, Other (non HMO) | Admitting: Family Medicine

## 2012-08-04 ENCOUNTER — Encounter: Payer: Self-pay | Admitting: Family Medicine

## 2012-08-04 VITALS — BP 126/82 | HR 70 | Temp 98.6°F | Ht 72.0 in | Wt 354.4 lb

## 2012-08-04 DIAGNOSIS — Z0000 Encounter for general adult medical examination without abnormal findings: Secondary | ICD-10-CM

## 2012-08-04 DIAGNOSIS — E785 Hyperlipidemia, unspecified: Secondary | ICD-10-CM

## 2012-08-04 DIAGNOSIS — I10 Essential (primary) hypertension: Secondary | ICD-10-CM

## 2012-08-04 LAB — POCT URINALYSIS DIPSTICK
Bilirubin, UA: NEGATIVE
Glucose, UA: NEGATIVE
Leukocytes, UA: NEGATIVE
Nitrite, UA: NEGATIVE
pH, UA: 5

## 2012-08-04 LAB — CBC WITH DIFFERENTIAL/PLATELET
Basophils Relative: 0.4 % (ref 0.0–3.0)
Eosinophils Relative: 1 % (ref 0.0–5.0)
Hemoglobin: 14.4 g/dL (ref 13.0–17.0)
Lymphocytes Relative: 27.3 % (ref 12.0–46.0)
MCV: 94.4 fl (ref 78.0–100.0)
Neutro Abs: 7 10*3/uL (ref 1.4–7.7)
Neutrophils Relative %: 64.5 % (ref 43.0–77.0)
RBC: 4.63 Mil/uL (ref 4.22–5.81)
WBC: 10.8 10*3/uL — ABNORMAL HIGH (ref 4.5–10.5)

## 2012-08-04 LAB — HEPATIC FUNCTION PANEL
ALT: 18 U/L (ref 0–53)
AST: 25 U/L (ref 0–37)
Albumin: 4.1 g/dL (ref 3.5–5.2)
Alkaline Phosphatase: 57 U/L (ref 39–117)
Bilirubin, Direct: 0 mg/dL (ref 0.0–0.3)
Total Protein: 7.7 g/dL (ref 6.0–8.3)

## 2012-08-04 LAB — BASIC METABOLIC PANEL
Calcium: 9.3 mg/dL (ref 8.4–10.5)
Chloride: 104 mEq/L (ref 96–112)
Creatinine, Ser: 1 mg/dL (ref 0.4–1.5)
Sodium: 138 mEq/L (ref 135–145)

## 2012-08-04 LAB — TSH: TSH: 1.59 u[IU]/mL (ref 0.35–5.50)

## 2012-08-04 LAB — LIPID PANEL: Total CHOL/HDL Ratio: 5

## 2012-08-04 MED ORDER — ATORVASTATIN CALCIUM 20 MG PO TABS: 20.0000 mg | ORAL_TABLET | Freq: Every day | ORAL | Status: DC

## 2012-08-04 MED ORDER — METOPROLOL SUCCINATE ER 100 MG PO TB24: 100.0000 mg | ORAL_TABLET | Freq: Every day | ORAL | Status: DC

## 2012-08-04 MED ORDER — AMLODIPINE-VALSARTAN-HCTZ 10-320-25 MG PO TABS: ORAL_TABLET | ORAL | Status: DC

## 2012-08-04 NOTE — Assessment & Plan Note (Signed)
Check labs Cont' meds 

## 2012-08-04 NOTE — Assessment & Plan Note (Signed)
Stable con't meds 

## 2012-08-04 NOTE — Progress Notes (Signed)
Subjective:    Patient ID: Jack Thomas, male    DOB: 11-01-1975, 37 y.o.   MRN: [id number]  HPI Pt here for dot and cpe and labs.  No complaints.    Review of Systems Review of Systems  Constitutional: Negative for activity change, appetite change and fatigue.  HENT: Negative for hearing loss, congestion, tinnitus and ear discharge.  dentist q42m Eyes: Negative for visual disturbance (see optho q1y -- vision corrected to 20/20 with glasses).  Respiratory: Negative for cough, chest tightness and shortness of breath.   Cardiovascular: Negative for chest pain, palpitations and leg swelling.  Gastrointestinal: Negative for abdominal pain, diarrhea, constipation and abdominal distention.  Genitourinary: Negative for urgency, frequency, decreased urine volume and difficulty urinating.  Musculoskeletal: Negative for back pain, arthralgias and gait problem.  Skin: Negative for color change, pallor and rash.  Neurological: Negative for dizziness, light-headedness, numbness and headaches.  Hematological: Negative for adenopathy. Does not bruise/bleed easily.  Psychiatric/Behavioral: Negative for suicidal ideas, confusion, sleep disturbance, self-injury, dysphoric mood, decreased concentration and agitation.    Family History  Problem Relation Age of Onset  . Diabetes Mother   . Allergies Mother   . Hyperlipidemia Father   . Hypertension Father   . Diabetes Father   . Anesthesia problems Neg Hx   . Hypotension Neg Hx   . Malignant hyperthermia Neg Hx   . Pseudochol deficiency Neg Hx    History   Social History  . Marital Status: Single    Spouse Name: N/A    Number of Children: 0  . Years of Education: N/A   Occupational History  . Forklift driver [employer]   Social History Main Topics  . Smoking status: Former Smoker -- 0.5 packs/day for 6 years    Types: Cigars    Quit date: 11/02/2010  . Smokeless tobacco: Never Used  . Alcohol Use: Yes     Comment: occasionally  .  Drug Use: No  . Sexually Active: Not on file   Other Topics Concern  . Not on file   Social History Narrative  . No narrative on file   Past Medical History  Diagnosis Date  . Hyperlipidemia   . Hypertension     takes metoprolol daily  . Sleep apnea     sleep study - recommended cpap  . Arthritis     right knee; left shoulder   Past Surgical History  Procedure Date  . Knee surgery 2009    right  . Nasal sinus surgery 12/03/2011    Procedure: ENDOSCOPIC SINUS SURGERY;  Surgeon: Serena Colonel, MD;  Location: [facility] OR;  Service: ENT;  Laterality: Bilateral;  . Nasal septoplasty w/ turbinoplasty 12/03/2011    Procedure: NASAL SEPTOPLASTY WITH TURBINATE REDUCTION;  Surgeon: Serena Colonel, MD;  Location: [facility] OR;  Service: ENT;  Laterality: Bilateral;   Current Outpatient Prescriptions on File Prior to Visit  Medication Sig Dispense Refill  . Amlodipine-Valsartan-HCTZ (EXFORGE HCT) 10-320-25 MG TABS 1 po qd  30 tablet  11  . atorvastatin (LIPITOR) 20 MG tablet Take 1 tablet (20 mg total) by mouth at bedtime.  30 tablet  5  . fluticasone (FLONASE) 50 MCG/ACT nasal spray Place 2 sprays into the nose daily.  16 g  6  . Omega-3 Fatty Acids (FISH OIL) 1200 MG CAPS 1 po tid      . OVER THE COUNTER MEDICATION Place 1 drop into both eyes 2 (two) times daily as needed. For red eyes  Objective:   Physical Exam  BP 126/82  Pulse 70  Temp 98.6 F (37 C) (Oral)  Ht 6' (1.829 m)  Wt 354 lb 6.4 oz (160.755 kg)  BMI 48.07 kg/m2  SpO2 96% General appearance: alert, cooperative, appears stated age and no distress Head: Normocephalic, without obvious abnormality, atraumatic Eyes: conjunctivae/corneas clear. PERRL, EOM's intact. Fundi benign. Ears: normal TM's and external ear canals both ears Nose: Nares normal. Septum midline. Mucosa normal. No drainage or sinus tenderness. Throat: lips, mucosa, and tongue normal; teeth and gums normal Neck: no adenopathy, no carotid bruit, no JVD,  supple, symmetrical, trachea midline and thyroid not enlarged, symmetric, no tenderness/mass/nodules Back: symmetric, no curvature. ROM normal. No CVA tenderness. Lungs: clear to auscultation bilaterally Chest wall: no tenderness Heart: S1, S2 normal Abdomen: soft, non-tender; bowel sounds normal; no masses,  no organomegaly Male genitalia: normal Rectal: deferred Extremities: extremities normal, atraumatic, no cyanosis or edema Pulses: 2+ and symmetric Skin: Skin color, texture, turgor normal. No rashes or lesions Lymph nodes: Cervical, supraclavicular, and axillary nodes normal. Neurologic: Alert and oriented X 3, normal strength and tone. Normal symmetric reflexes. Normal coordination and gait Psych-- no depression, no anxiety      Assessment & Plan:  cpe--- check labs            ghm utd            See avs

## 2012-08-04 NOTE — Patient Instructions (Signed)
Preventive Care for Adults, Male A healthy lifestyle and preventive care can promote health and wellness. Preventive health guidelines for men include the following key practices:  A routine yearly physical is a good way to check with your caregiver about your health and preventative screening. It is a chance to share any concerns and updates on your health, and to receive a thorough exam.  Visit your dentist for a routine exam and preventative care every 6 months. Brush your teeth twice a day and floss once a day. Good oral hygiene prevents tooth decay and gum disease.  The frequency of eye exams is based on your age, health, family medical history, use of contact lenses, and other factors. Follow your caregiver's recommendations for frequency of eye exams.  Eat a healthy diet. Foods like vegetables, fruits, whole grains, low-fat dairy products, and lean protein foods contain the nutrients you need without too many calories. Decrease your intake of foods high in solid fats, added sugars, and salt. Eat the right amount of calories for you.Get information about a proper diet from your caregiver, if necessary.  Regular physical exercise is one of the most important things you can do for your health. Most adults should get at least 150 minutes of moderate-intensity exercise (any activity that increases your heart rate and causes you to sweat) each week. In addition, most adults need muscle-strengthening exercises on 2 or more days a week.  Maintain a healthy weight. The body mass index (BMI) is a screening tool to identify possible weight problems. It provides an estimate of body fat based on height and weight. Your caregiver can help determine your BMI, and can help you achieve or maintain a healthy weight.For adults 20 years and older:  A BMI below 18.5 is considered underweight.  A BMI of 18.5 to 24.9 is normal.  A BMI of 25 to 29.9 is considered overweight.  A BMI of 30 and above is  considered obese.  Maintain normal blood lipids and cholesterol levels by exercising and minimizing your intake of saturated fat. Eat a balanced diet with plenty of fruit and vegetables. Blood tests for lipids and cholesterol should begin at age 20 and be repeated every 5 years. If your lipid or cholesterol levels are high, you are over 50, or you are a high risk for heart disease, you may need your cholesterol levels checked more frequently.Ongoing high lipid and cholesterol levels should be treated with medicines if diet and exercise are not effective.  If you smoke, find out from your caregiver how to quit. If you do not use tobacco, do not start.  If you choose to drink alcohol, do not exceed 2 drinks per day. One drink is considered to be 12 ounces (355 mL) of beer, 5 ounces (148 mL) of wine, or 1.5 ounces (44 mL) of liquor.  Avoid use of street drugs. Do not share needles with anyone. Ask for help if you need support or instructions about stopping the use of drugs.  High blood pressure causes heart disease and increases the risk of stroke. Your blood pressure should be checked at least every 1 to 2 years. Ongoing high blood pressure should be treated with medicines, if weight loss and exercise are not effective.  If you are 45 to 35 years old, ask your caregiver if you should take aspirin to prevent heart disease.  Diabetes screening involves taking a blood sample to check your fasting blood sugar level. This should be done once every 3 years,   after age 45, if you are within normal weight and without risk factors for diabetes. Testing should be considered at a younger age or be carried out more frequently if you are overweight and have at least 1 risk factor for diabetes.  Colorectal cancer can be detected and often prevented. Most routine colorectal cancer screening begins at the age of 50 and continues through age 75. However, your caregiver may recommend screening at an earlier age if you  have risk factors for colon cancer. On a yearly basis, your caregiver may provide home test kits to check for hidden blood in the stool. Use of a small camera at the end of a tube, to directly examine the colon (sigmoidoscopy or colonoscopy), can detect the earliest forms of colorectal cancer. Talk to your caregiver about this at age 50, when routine screening begins. Direct examination of the colon should be repeated every 5 to 10 years through age 75, unless early forms of pre-cancerous polyps or small growths are found.  Hepatitis C blood testing is recommended for all people born from 1945 through 1965 and any individual with known risks for hepatitis C.  Practice safe sex. Use condoms and avoid high-risk sexual practices to reduce the spread of sexually transmitted infections (STIs). STIs include gonorrhea, chlamydia, syphilis, trichomonas, herpes, HPV, and human immunodeficiency virus (HIV). Herpes, HIV, and HPV are viral illnesses that have no cure. They can result in disability, cancer, and death.  A one-time screening for abdominal aortic aneurysm (AAA) and surgical repair of large AAAs by sound wave imaging (ultrasonography) is recommended for ages 65 to 75 years who are current or former smokers.  Healthy men should no longer receive prostate-specific antigen (PSA) blood tests as part of routine cancer screening. Consult with your caregiver about prostate cancer screening.  Testicular cancer screening is not recommended for adult males who have no symptoms. Screening includes self-exam, caregiver exam, and other screening tests. Consult with your caregiver about any symptoms you have or any concerns you have about testicular cancer.  Use sunscreen with skin protection factor (SPF) of 30 or more. Apply sunscreen liberally and repeatedly throughout the day. You should seek shade when your shadow is shorter than you. Protect yourself by wearing long sleeves, pants, a wide-brimmed hat, and  sunglasses year round, whenever you are outdoors.  Once a month, do a whole body skin exam, using a mirror to look at the skin on your back. Notify your caregiver of new moles, moles that have irregular borders, moles that are larger than a pencil eraser, or moles that have changed in shape or color.  Stay current with required immunizations.  Influenza. You need a dose every fall (or winter). The composition of the flu vaccine changes each year, so being vaccinated once is not enough.  Pneumococcal polysaccharide. You need 1 to 2 doses if you smoke cigarettes or if you have certain chronic medical conditions. You need 1 dose at age 65 (or older) if you have never been vaccinated.  Tetanus, diphtheria, pertussis (Tdap, Td). Get 1 dose of Tdap vaccine if you are younger than age 65 years, are over 65 and have contact with an infant, are a healthcare worker, or simply want to be protected from whooping cough. After that, you need a Td booster dose every 10 years. Consult your caregiver if you have not had at least 3 tetanus and diphtheria-containing shots sometime in your life or have a deep or dirty wound.  HPV. This vaccine is recommended   for males 13 through 35 years of age. This vaccine may be given to men 22 through 35 years of age who have not completed the 3 dose series. It is recommended for men through age 26 who have sex with men or whose immune system is weakened because of HIV infection, other illness, or medications. The vaccine is given in 3 doses over 6 months.  Measles, mumps, rubella (MMR). You need at least 1 dose of MMR if you were born in 1957 or later. You may also need a 2nd dose.  Meningococcal. If you are age 19 to 21 years and a first-year college student living in a residence hall, or have one of several medical conditions, you need to get vaccinated against meningococcal disease. You may also need additional booster doses.  Zoster (shingles). If you are age 60 years or  older, you should get this vaccine.  Varicella (chickenpox). If you have never had chickenpox or you were vaccinated but received only 1 dose, talk to your caregiver to find out if you need this vaccine.  Hepatitis A. You need this vaccine if you have a specific risk factor for hepatitis A virus infection, or you simply wish to be protected from this disease. The vaccine is usually given as 2 doses, 6 to 18 months apart.  Hepatitis B. You need this vaccine if you have a specific risk factor for hepatitis B virus infection or you simply wish to be protected from this disease. The vaccine is given in 3 doses, usually over 6 months. Preventative Service / Frequency Ages 19 to 39  Blood pressure check.** / Every 1 to 2 years.  Lipid and cholesterol check.** / Every 5 years beginning at age 20.  Hepatitis C blood test.** / For any individual with known risks for hepatitis C.  Skin self-exam. / Monthly.  Influenza immunization.** / Every year.  Pneumococcal polysaccharide immunization.** / 1 to 2 doses if you smoke cigarettes or if you have certain chronic medical conditions.  Tetanus, diphtheria, pertussis (Tdap,Td) immunization. / A one-time dose of Tdap vaccine. After that, you need a Td booster dose every 10 years.  HPV immunization. / 3 doses over 6 months, if 26 and younger.  Measles, mumps, rubella (MMR) immunization. / You need at least 1 dose of MMR if you were born in 1957 or later. You may also need a 2nd dose.  Meningococcal immunization. / 1 dose if you are age 19 to 21 years and a first-year college student living in a residence hall, or have one of several medical conditions, you need to get vaccinated against meningococcal disease. You may also need additional booster doses.  Varicella immunization.** / Consult your caregiver.  Hepatitis A immunization.** / Consult your caregiver. 2 doses, 6 to 18 months apart.  Hepatitis B immunization.** / Consult your caregiver. 3 doses  usually over 6 months. Ages 40 to 64  Blood pressure check.** / Every 1 to 2 years.  Lipid and cholesterol check.** / Every 5 years beginning at age 20.  Fecal occult blood test (FOBT) of stool. / Every year beginning at age 50 and continuing until age 75. You may not have to do this test if you get colonoscopy every 10 years.  Flexible sigmoidoscopy** or colonoscopy.** / Every 5 years for a flexible sigmoidoscopy or every 10 years for a colonoscopy beginning at age 50 and continuing until age 75.  Hepatitis C blood test.** / For all people born from 1945 through 1965 and any   individual with known risks for hepatitis C.  Skin self-exam. / Monthly.  Influenza immunization.** / Every year.  Pneumococcal polysaccharide immunization.** / 1 to 2 doses if you smoke cigarettes or if you have certain chronic medical conditions.  Tetanus, diphtheria, pertussis (Tdap/Td) immunization.** / A one-time dose of Tdap vaccine. After that, you need a Td booster dose every 10 years.  Measles, mumps, rubella (MMR) immunization. / You need at least 1 dose of MMR if you were born in 1957 or later. You may also need a 2nd dose.  Varicella immunization.**/ Consult your caregiver.  Meningococcal immunization.** / Consult your caregiver.  Hepatitis A immunization.** / Consult your caregiver. 2 doses, 6 to 18 months apart.  Hepatitis B immunization.** / Consult your caregiver. 3 doses, usually over 6 months. Ages 65 and over  Blood pressure check.** / Every 1 to 2 years.  Lipid and cholesterol check.**/ Every 5 years beginning at age 20.  Fecal occult blood test (FOBT) of stool. / Every year beginning at age 50 and continuing until age 75. You may not have to do this test if you get colonoscopy every 10 years.  Flexible sigmoidoscopy** or colonoscopy.** / Every 5 years for a flexible sigmoidoscopy or every 10 years for a colonoscopy beginning at age 50 and continuing until age 75.  Hepatitis C blood  test.** / For all people born from 1945 through 1965 and any individual with known risks for hepatitis C.  Abdominal aortic aneurysm (AAA) screening.** / A one-time screening for ages 65 to 75 years who are current or former smokers.  Skin self-exam. / Monthly.  Influenza immunization.** / Every year.  Pneumococcal polysaccharide immunization.** / 1 dose at age 65 (or older) if you have never been vaccinated.  Tetanus, diphtheria, pertussis (Tdap, Td) immunization. / A one-time dose of Tdap vaccine if you are over 65 and have contact with an infant, are a healthcare worker, or simply want to be protected from whooping cough. After that, you need a Td booster dose every 10 years.  Varicella immunization. ** / Consult your caregiver.  Meningococcal immunization.** / Consult your caregiver.  Hepatitis A immunization. ** / Consult your caregiver. 2 doses, 6 to 18 months apart.  Hepatitis B immunization.** / Check with your caregiver. 3 doses, usually over 6 months. **Family history and personal history of risk and conditions may change your caregiver's recommendations. Document Released: 09/15/2001 Document Revised: 10/12/2011 Document Reviewed: 12/15/2010 [employer] Patient Information 2013 [employer].  

## 2012-08-08 ENCOUNTER — Telehealth: Payer: Self-pay

## 2012-08-08 DIAGNOSIS — E785 Hyperlipidemia, unspecified: Secondary | ICD-10-CM

## 2012-08-08 DIAGNOSIS — I10 Essential (primary) hypertension: Secondary | ICD-10-CM

## 2012-08-08 MED ORDER — METOPROLOL SUCCINATE ER 100 MG PO TB24: 100.0000 mg | ORAL_TABLET | Freq: Every day | ORAL | Status: DC

## 2012-08-08 MED ORDER — AMLODIPINE-VALSARTAN-HCTZ 10-320-25 MG PO TABS: ORAL_TABLET | ORAL | Status: DC

## 2012-08-08 MED ORDER — ATORVASTATIN CALCIUM 40 MG PO TABS: 40.0000 mg | ORAL_TABLET | Freq: Every day | ORAL | Status: DC

## 2012-08-08 NOTE — Telephone Encounter (Signed)
Discussed with patient and medications have been faxed       KP

## 2012-08-08 NOTE — Telephone Encounter (Signed)
Message copied by Arnette Norris on Mon Aug 08, 2012  4:20 PM ------      Message from: Lelon Perla      Created: Thu Aug 04, 2012  8:07 PM       Glucose is better---- con't diet and exercise      Cholesterol--- LDL goal < 100,  HDL >40,  TG < 150.  Diet and exercise will increase HDL and decrease LDL and TG.  Fish,  Fish Oil, Flaxseed oil will also help increase the HDL and decrease Triglycerides.   Recheck labs in 3 months.--------  272.4  Lipid, hep      Increase lipitor to 40 mg #30  1 po qhs,  2 refills

## 2012-12-15 ENCOUNTER — Ambulatory Visit (INDEPENDENT_AMBULATORY_CARE_PROVIDER_SITE_OTHER): Payer: Managed Care, Other (non HMO) | Admitting: Family Medicine

## 2012-12-15 ENCOUNTER — Encounter: Payer: Self-pay | Admitting: Family Medicine

## 2012-12-15 VITALS — BP 138/84 | HR 74 | Temp 99.1°F | Wt 378.4 lb

## 2012-12-15 DIAGNOSIS — J019 Acute sinusitis, unspecified: Secondary | ICD-10-CM

## 2012-12-15 DIAGNOSIS — I10 Essential (primary) hypertension: Secondary | ICD-10-CM

## 2012-12-15 MED ORDER — AMOXICILLIN-POT CLAVULANATE 875-125 MG PO TABS: 1.0000 | ORAL_TABLET | Freq: Two times a day (BID) | ORAL | Status: DC

## 2012-12-15 MED ORDER — PREDNISONE 10 MG PO TABS: ORAL_TABLET | ORAL | Status: DC

## 2012-12-15 NOTE — Patient Instructions (Addendum)

## 2012-12-15 NOTE — Progress Notes (Signed)
  Subjective:     Jack Thomas is a 37 y.o. male who presents for evaluation of sinus pain. Symptoms include: congestion, cough, facial pain, fevers, nasal congestion, sinus pressure and sore throat. Onset of symptoms was 8 days ago. Symptoms have been gradually worsening since that time. Past history is significant for no history of pneumonia or bronchitis. Patient is a non-smoker.  The following portions of the patient's history were reviewed and updated as appropriate: allergies, current medications, past family history, past medical history, past social history, past surgical history and problem list.  Review of Systems Pertinent items are noted in HPI.   Objective:    BP 138/84  Pulse 74  Temp(Src) 99.1 F (37.3 C) (Oral)  Wt 378 lb 6.4 oz (171.641 kg)  BMI 51.31 kg/m2  SpO2 95% General appearance: alert, cooperative, appears stated age and no distress Ears: normal TM's and external ear canals both ears Nose: green discharge, moderate congestion, turbinates red, swollen, sinus tenderness bilateral Throat: abnormal findings: moderate oropharyngeal erythema and pnd Neck: moderate anterior cervical adenopathy, supple, symmetrical, trachea midline and thyroid not enlarged, symmetric, no tenderness/mass/nodules Lungs: clear to auscultation bilaterally Heart: S1, S2 normal    Assessment:    Acute bacterial sinusitis.    Plan:    Nasal steroids per medication orders. Antihistamines per medication orders. Augmentin per medication orders. f/u prn

## 2013-01-16 ENCOUNTER — Emergency Department (HOSPITAL_COMMUNITY): Payer: Managed Care, Other (non HMO)

## 2013-01-16 ENCOUNTER — Observation Stay (HOSPITAL_COMMUNITY)
Admission: EM | Admit: 2013-01-16 | Discharge: 2013-01-17 | Disposition: A | Discharge status: Home / Self Care | Payer: Managed Care, Other (non HMO) | Attending: General Surgery | Admitting: General Surgery

## 2013-01-16 ENCOUNTER — Encounter (HOSPITAL_COMMUNITY): Payer: Self-pay

## 2013-01-16 DIAGNOSIS — R109 Unspecified abdominal pain: Secondary | ICD-10-CM

## 2013-01-16 DIAGNOSIS — J330 Polyp of nasal cavity: Secondary | ICD-10-CM | POA: Insufficient documentation

## 2013-01-16 DIAGNOSIS — S01409A Unspecified open wound of unspecified cheek and temporomandibular area, initial encounter: Secondary | ICD-10-CM | POA: Insufficient documentation

## 2013-01-16 DIAGNOSIS — IMO0002 Reserved for concepts with insufficient information to code with codable children: Secondary | ICD-10-CM | POA: Insufficient documentation

## 2013-01-16 DIAGNOSIS — G4733 Obstructive sleep apnea (adult) (pediatric): Secondary | ICD-10-CM | POA: Insufficient documentation

## 2013-01-16 DIAGNOSIS — R079 Chest pain, unspecified: Secondary | ICD-10-CM

## 2013-01-16 DIAGNOSIS — S0100XA Unspecified open wound of scalp, initial encounter: Principal | ICD-10-CM | POA: Insufficient documentation

## 2013-01-16 DIAGNOSIS — R1011 Right upper quadrant pain: Secondary | ICD-10-CM | POA: Diagnosis present

## 2013-01-16 DIAGNOSIS — E785 Hyperlipidemia, unspecified: Secondary | ICD-10-CM | POA: Insufficient documentation

## 2013-01-16 DIAGNOSIS — S0181XA Laceration without foreign body of other part of head, initial encounter: Secondary | ICD-10-CM

## 2013-01-16 DIAGNOSIS — I10 Essential (primary) hypertension: Secondary | ICD-10-CM | POA: Insufficient documentation

## 2013-01-16 DIAGNOSIS — R10819 Abdominal tenderness, unspecified site: Secondary | ICD-10-CM

## 2013-01-16 DIAGNOSIS — Z6841 Body Mass Index (BMI) 40.0 and over, adult: Secondary | ICD-10-CM | POA: Insufficient documentation

## 2013-01-16 DIAGNOSIS — J309 Allergic rhinitis, unspecified: Secondary | ICD-10-CM | POA: Insufficient documentation

## 2013-01-16 LAB — CBC
HCT: 44.4 % (ref 39.0–52.0)
Hemoglobin: 14.3 g/dL (ref 13.0–17.0)
MCHC: 32.2 g/dL (ref 30.0–36.0)
MCV: 97.8 fL (ref 78.0–100.0)
RDW: 12.6 % (ref 11.5–15.5)

## 2013-01-16 LAB — POCT I-STAT, CHEM 8
BUN: 16 mg/dL (ref 6–23)
Calcium, Ion: 1.17 mmol/L (ref 1.12–1.23)
HCT: 44 % (ref 39.0–52.0)
Sodium: 141 mEq/L (ref 135–145)
TCO2: 29 mmol/L (ref 0–100)

## 2013-01-16 LAB — URINALYSIS, ROUTINE W REFLEX MICROSCOPIC
Glucose, UA: NEGATIVE mg/dL
Hgb urine dipstick: NEGATIVE
Leukocytes, UA: NEGATIVE
Protein, ur: NEGATIVE mg/dL
Specific Gravity, Urine: 1.01 (ref 1.005–1.030)
Urobilinogen, UA: 1 mg/dL (ref 0.0–1.0)

## 2013-01-16 LAB — COMPREHENSIVE METABOLIC PANEL
ALT: 38 U/L (ref 0–53)
AST: 87 U/L — ABNORMAL HIGH (ref 0–37)
Albumin: 3.9 g/dL (ref 3.5–5.2)
Alkaline Phosphatase: 59 U/L (ref 39–117)
Calcium: 9.7 mg/dL (ref 8.4–10.5)
Potassium: 4.1 mEq/L (ref 3.5–5.1)
Sodium: 140 mEq/L (ref 135–145)
Total Protein: 7.2 g/dL (ref 6.0–8.3)

## 2013-01-16 LAB — SAMPLE TO BLOOD BANK

## 2013-01-16 LAB — CDS SEROLOGY

## 2013-01-16 LAB — CG4 I-STAT (LACTIC ACID): Lactic Acid, Venous: 2.21 mmol/L — ABNORMAL HIGH (ref 0.5–2.2)

## 2013-01-16 MED ORDER — HYDROMORPHONE HCL PF 1 MG/ML IJ SOLN
1.0000 mg | INTRAMUSCULAR | Status: DC | PRN
  Administered 2013-01-16 (×3): 1 mg via INTRAVENOUS
  Filled 2013-01-16 (×3): qty 1

## 2013-01-16 MED ORDER — TETANUS-DIPHTH-ACELL PERTUSSIS 5-2.5-18.5 LF-MCG/0.5 IM SUSP
0.5000 mL | Freq: Once | INTRAMUSCULAR | Status: AC
  Administered 2013-01-16: 0.5 mL via INTRAMUSCULAR
  Filled 2013-01-16: qty 0.5

## 2013-01-16 MED ORDER — IOHEXOL 300 MG/ML  SOLN
100.0000 mL | Freq: Once | INTRAMUSCULAR | Status: AC | PRN
  Administered 2013-01-16: 100 mL via INTRAVENOUS

## 2013-01-16 MED ORDER — SODIUM CHLORIDE 0.9 % IV BOLUS (SEPSIS)
1000.0000 mL | Freq: Once | INTRAVENOUS | Status: AC
  Administered 2013-01-16: 1000 mL via INTRAVENOUS

## 2013-01-16 MED ORDER — HYDROCODONE-ACETAMINOPHEN 5-325 MG PO TABS: 0.5000 | ORAL_TABLET | ORAL | Status: DC | PRN

## 2013-01-16 MED ORDER — ONDANSETRON HCL 4 MG/2ML IJ SOLN: 4.0000 mg | Freq: Four times a day (QID) | INTRAMUSCULAR | Status: DC | PRN

## 2013-01-16 MED ORDER — ONDANSETRON HCL 4 MG PO TABS: 4.0000 mg | ORAL_TABLET | Freq: Four times a day (QID) | ORAL | Status: DC | PRN

## 2013-01-16 MED ORDER — HYDROMORPHONE HCL PF 2 MG/ML IJ SOLN
2.0000 mg | INTRAMUSCULAR | Status: DC | PRN
  Filled 2013-01-16: qty 2
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
## 2013-01-16 NOTE — ED Notes (Signed)
Trama MD at bedside.

## 2013-01-16 NOTE — ED Notes (Signed)
Portable xrays completed.
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
## 2013-01-17 DIAGNOSIS — R1011 Right upper quadrant pain: Secondary | ICD-10-CM | POA: Diagnosis present

## 2013-01-17 DIAGNOSIS — S0181XA Laceration without foreign body of other part of head, initial encounter: Secondary | ICD-10-CM

## 2013-01-17 LAB — CBC
HCT: 41.4 % (ref 39.0–52.0)
Hemoglobin: 13.4 g/dL (ref 13.0–17.0)
MCH: 31.3 pg (ref 26.0–34.0)
MCHC: 32.4 g/dL (ref 30.0–36.0)
RDW: 12.7 % (ref 11.5–15.5)

## 2013-01-17 MED ORDER — HYDROCODONE-ACETAMINOPHEN 10-325 MG PO TABS: 1.0000 | ORAL_TABLET | ORAL | Status: DC | PRN

## 2013-01-17 MED ORDER — HYDROMORPHONE HCL PF 1 MG/ML IJ SOLN
2.0000 mg | INTRAMUSCULAR | Status: DC | PRN
  Administered 2013-01-17 (×3): 2 mg via INTRAVENOUS
  Filled 2013-01-17: qty 1
  Filled 2013-01-17 (×2): qty 2

## 2013-01-17 MED ORDER — HYDROMORPHONE HCL 2 MG PO TABS: 1.0000 mg | ORAL_TABLET | ORAL | Status: DC | PRN

## 2013-01-17 MED ORDER — HYDROCODONE-ACETAMINOPHEN 10-325 MG PO TABS
1.0000 | ORAL_TABLET | ORAL | Status: DC | PRN
  Administered 2013-01-17: 2 via ORAL
  Filled 2013-01-17: qty 2

## 2013-01-17 NOTE — Discharge Summary (Signed)
Tmya Wigington, MD, MPH, FACS Pager: [phone]  
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
## 2013-01-17 NOTE — Progress Notes (Signed)
Pt discharged to home

## 2013-01-17 NOTE — Progress Notes (Signed)
Plan D/C this PM if tolerates PO and pain under control. Mild tenderness over RUQ rib area. Patient examined and I agree with the assessment and plan  Violeta Gelinas, MD, MPH, FACS Pager: [phone]  01/17/2013 12:52 PM

## 2013-01-20 ENCOUNTER — Encounter (INDEPENDENT_AMBULATORY_CARE_PROVIDER_SITE_OTHER): Payer: Self-pay | Admitting: General Surgery

## 2013-01-20 ENCOUNTER — Ambulatory Visit (INDEPENDENT_AMBULATORY_CARE_PROVIDER_SITE_OTHER): Payer: Managed Care, Other (non HMO) | Admitting: Internal Medicine

## 2013-01-20 ENCOUNTER — Encounter (INDEPENDENT_AMBULATORY_CARE_PROVIDER_SITE_OTHER): Payer: Self-pay | Admitting: Internal Medicine

## 2013-01-20 DIAGNOSIS — S0180XA Unspecified open wound of other part of head, initial encounter: Secondary | ICD-10-CM

## 2013-01-20 DIAGNOSIS — S0191XD Laceration without foreign body of unspecified part of head, subsequent encounter: Secondary | ICD-10-CM

## 2013-01-20 DIAGNOSIS — S1191XD Laceration without foreign body of unspecified part of neck, subsequent encounter: Secondary | ICD-10-CM
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
## 2013-01-20 NOTE — Patient Instructions (Addendum)
Return to work in one week Follow with [place] as needed

## 2013-02-08 ENCOUNTER — Ambulatory Visit: Payer: Managed Care, Other (non HMO) | Admitting: Family Medicine

## 2013-02-10 ENCOUNTER — Ambulatory Visit: Payer: Managed Care, Other (non HMO) | Admitting: Family Medicine

## 2013-02-14 ENCOUNTER — Ambulatory Visit: Payer: Managed Care, Other (non HMO) | Admitting: Family Medicine

## 2013-02-14 DIAGNOSIS — Z0289 Encounter for other administrative examinations: Secondary | ICD-10-CM

## 2013-04-17 ENCOUNTER — Ambulatory Visit (INDEPENDENT_AMBULATORY_CARE_PROVIDER_SITE_OTHER): Payer: Managed Care, Other (non HMO) | Admitting: Family Medicine

## 2013-04-17 ENCOUNTER — Encounter: Payer: Self-pay | Admitting: Family Medicine

## 2013-04-17 VITALS — BP 130/92 | HR 82 | Temp 98.4°F | Wt 392.0 lb

## 2013-04-17 DIAGNOSIS — J209 Acute bronchitis, unspecified: Secondary | ICD-10-CM

## 2013-04-17 DIAGNOSIS — H6122 Impacted cerumen, left ear: Secondary | ICD-10-CM

## 2013-04-17 DIAGNOSIS — H6120 Impacted cerumen, unspecified ear: Secondary | ICD-10-CM

## 2013-04-17 DIAGNOSIS — J019 Acute sinusitis, unspecified: Secondary | ICD-10-CM

## 2013-04-17 MED ORDER — METHYLPREDNISOLONE ACETATE 80 MG/ML IJ SUSP
80.0000 mg | Freq: Once | INTRAMUSCULAR | Status: AC
  Administered 2013-04-17: 80 mg via INTRAMUSCULAR

## 2013-04-17 MED ORDER — PREDNISONE 10 MG PO TABS: ORAL_TABLET | ORAL | Status: DC

## 2013-04-17 MED ORDER — AMOXICILLIN-POT CLAVULANATE 875-125 MG PO TABS: 1.0000 | ORAL_TABLET | Freq: Two times a day (BID) | ORAL | Status: DC

## 2013-04-17 NOTE — Progress Notes (Signed)
  Subjective:     Jack Thomas is a 37 y.o. male who presents for evaluation of sinus pain. Symptoms include: congestion, cough, facial pain, headaches, nasal congestion, purulent rhinorrhea, sinus pressure and wheezing. Onset of symptoms was 4 days ago. Symptoms have been gradually worsening since that time. Past history is significant for no history of pneumonia or bronchitis. Patient is a non-smoker.  L ear feels clogged.  The following portions of the patient's history were reviewed and updated as appropriate: allergies, current medications, past family history, past medical history, past social history, past surgical history and problem list.  Review of Systems Pertinent items are noted in HPI.   Objective:    BP 130/92  Pulse 82  Temp(Src) 98.4 F (36.9 C) (Oral)  Wt 392 lb (177.81 kg)  BMI 51.73 kg/m2  SpO2 95% General appearance: alert, cooperative, appears stated age and mild distress Ears: L ear + cerumen impaction Nose: green discharge, moderate congestion, turbinates red, swollen, sinus tenderness bilateral Throat: lips, mucosa, and tongue normal; teeth and gums normal Neck: moderate anterior cervical adenopathy, supple, symmetrical, trachea midline and thyroid not enlarged, symmetric, no tenderness/mass/nodules Lungs: wheezes bilaterally Heart: S1, S2 normal    Assessment:    Acute bacterial sinusitis Bronchitis L cerumen impaction---unable to remove with hoop,  Irrigated successfully .    Plan:    Nasal steroids per medication orders. Antihistamines per medication orders. Augmentin per medication orders.  Steroid taper  depo medrol

## 2013-04-17 NOTE — Addendum Note (Signed)
Addended by: Arnette Norris on: 04/17/2013 03:16 PM   Modules accepted: Orders

## 2013-04-17 NOTE — Patient Instructions (Signed)

## 2013-08-02 ENCOUNTER — Telehealth: Payer: Self-pay | Admitting: *Deleted

## 2013-08-09 NOTE — Telephone Encounter (Signed)
Error

## 2013-08-10 ENCOUNTER — Telehealth: Payer: Self-pay

## 2013-08-10 ENCOUNTER — Ambulatory Visit (INDEPENDENT_AMBULATORY_CARE_PROVIDER_SITE_OTHER): Payer: Managed Care, Other (non HMO) | Admitting: Family Medicine

## 2013-08-10 ENCOUNTER — Other Ambulatory Visit: Payer: Self-pay | Admitting: Family Medicine

## 2013-08-10 ENCOUNTER — Encounter: Payer: Self-pay | Admitting: Family Medicine

## 2013-08-10 VITALS — BP 160/96 | HR 101 | Temp 98.9°F | Wt >= 6400 oz

## 2013-08-10 DIAGNOSIS — I10 Essential (primary) hypertension: Secondary | ICD-10-CM

## 2013-08-10 DIAGNOSIS — R609 Edema, unspecified: Secondary | ICD-10-CM | POA: Insufficient documentation

## 2013-08-10 LAB — CBC WITH DIFFERENTIAL/PLATELET
BASOS PCT: 0.4 % (ref 0.0–3.0)
Basophils Absolute: 0 10*3/uL (ref 0.0–0.1)
EOS ABS: 0.2 10*3/uL (ref 0.0–0.7)
Eosinophils Relative: 2 % (ref 0.0–5.0)
HEMATOCRIT: 42.3 % (ref 39.0–52.0)
HEMOGLOBIN: 14.2 g/dL (ref 13.0–17.0)
Lymphocytes Relative: 30.7 % (ref 12.0–46.0)
Lymphs Abs: 3.7 10*3/uL (ref 0.7–4.0)
MCHC: 33.5 g/dL (ref 30.0–36.0)
MCV: 94.8 fl (ref 78.0–100.0)
MONO ABS: 0.7 10*3/uL (ref 0.1–1.0)
Monocytes Relative: 5.9 % (ref 3.0–12.0)
NEUTROS ABS: 7.4 10*3/uL (ref 1.4–7.7)
Neutrophils Relative %: 61 % (ref 43.0–77.0)
Platelets: 290 10*3/uL (ref 150.0–400.0)
RBC: 4.46 Mil/uL (ref 4.22–5.81)
RDW: 12.9 % (ref 11.5–14.6)
WBC: 12.2 10*3/uL — ABNORMAL HIGH (ref 4.5–10.5)

## 2013-08-10 LAB — BASIC METABOLIC PANEL
BUN: 14 mg/dL (ref 6–23)
CHLORIDE: 106 meq/L (ref 96–112)
CO2: 30 meq/L (ref 19–32)
Calcium: 8.8 mg/dL (ref 8.4–10.5)
Creatinine, Ser: 1 mg/dL (ref 0.4–1.5)
GFR: 110.42 mL/min (ref >60.00)
Glucose, Bld: 122 mg/dL — ABNORMAL HIGH (ref 70–99)
Potassium: 3.8 mEq/L (ref 3.5–5.1)
SODIUM: 142 meq/L (ref 135–145)

## 2013-08-10 LAB — HEPATIC FUNCTION PANEL
ALBUMIN: 4 g/dL (ref 3.5–5.2)
ALT: 25 U/L (ref 0–53)
AST: 26 U/L (ref 0–37)
Alkaline Phosphatase: 55 U/L (ref 39–117)
BILIRUBIN DIRECT: 0.1 mg/dL (ref 0.0–0.3)
Total Bilirubin: 0.6 mg/dL (ref 0.3–1.2)
Total Protein: 7.2 g/dL (ref 6.0–8.3)

## 2013-08-10 LAB — LIPID PANEL
Cholesterol: 181 mg/dL (ref 0–200)
HDL: 29.6 mg/dL — AB (ref >39.00)
LDL Cholesterol: 113 mg/dL — ABNORMAL HIGH (ref 0–99)
TRIGLYCERIDES: 194 mg/dL — AB (ref 0.0–149.0)
Total CHOL/HDL Ratio: 6
VLDL: 38.8 mg/dL (ref 0.0–40.0)

## 2013-08-10 MED ORDER — VALSARTAN 320 MG PO TABS: 320.0000 mg | ORAL_TABLET | Freq: Every day | ORAL | Status: DC

## 2013-08-10 MED ORDER — METOPROLOL SUCCINATE ER 25 MG PO TB24: 25.0000 mg | ORAL_TABLET | Freq: Every day | ORAL | Status: DC

## 2013-08-10 MED ORDER — AMLODIPINE BESYLATE 10 MG PO TABS: 10.0000 mg | ORAL_TABLET | Freq: Every day | ORAL | Status: DC

## 2013-08-10 MED ORDER — METOPROLOL SUCCINATE ER 100 MG PO TB24: ORAL_TABLET | ORAL | Status: DC

## 2013-08-10 MED ORDER — FUROSEMIDE 20 MG PO TABS: 20.0000 mg | ORAL_TABLET | Freq: Every day | ORAL | Status: DC

## 2013-08-10 NOTE — Progress Notes (Signed)
Pre visit review using our clinic review tool, if applicable. No additional management support is needed unless otherwise documented below in the visit note. 

## 2013-08-10 NOTE — Telephone Encounter (Signed)
Rx sent      KP 

## 2013-08-10 NOTE — Assessment & Plan Note (Signed)
Change hctz to lasix Elevated legs Consider norvasc as cause rto 2-3 weeks

## 2013-08-10 NOTE — Patient Instructions (Signed)

## 2013-08-10 NOTE — Progress Notes (Signed)
  Subjective:    Patient here for follow-up of elevated blood pressure.  He is not exercising and is adherent to a low-salt diet.  Blood pressure is not well controlled at home. Cardiac symptoms: none. Patient denies: chest pain, chest pressure/discomfort, claudication, dyspnea, exertional chest pressure/discomfort, fatigue, irregular heart beat, lower extremity edema, near-syncope, orthopnea, palpitations, paroxysmal nocturnal dyspnea, syncope and tachypnea. Cardiovascular risk factors: dyslipidemia, hypertension, male gender, obesity (BMI >= 30 kg/m2) and sedentary lifestyle. Use of agents associated with hypertension: none. History of target organ damage: none.  The following portions of the patient's history were reviewed and updated as appropriate: allergies, current medications, past family history, past medical history, past social history, past surgical history and problem list.  Review of Systems Pertinent items are noted in HPI.     Objective:    BP 160/96  Pulse 101  Temp(Src) 98.9 F (37.2 C) (Oral)  Wt 406 lb 9.6 oz (184.433 kg)  SpO2 95% General appearance: alert, cooperative, appears stated age and no distress Neck: no adenopathy, supple, symmetrical, trachea midline and thyroid not enlarged, symmetric, no tenderness/mass/nodules Lungs: clear to auscultation bilaterally Heart: S1, S2 normal Extremities: edema +1 pitting edema    Assessment:    Hypertension, stage 1 . Evidence of target organ damage: none.    Plan:    Medication: diovan 320 mg , lasix 20 mg , norvasc 10 mg and increase toprol xl 1 1/2 tab qd . Dietary sodium restriction. Regular aerobic exercise. Check blood pressures 2-3 times weekly and record. Follow up: 3 weeks and as needed.

## 2013-08-10 NOTE — Telephone Encounter (Signed)
Message copied by Ewing Schlein on Thu Aug 10, 2013  2:58 PM ------      Message from: Rosalita Chessman      Created: Thu Aug 10, 2013  2:12 PM       Cancel toprol xl 25 mg  ------

## 2013-08-11 ENCOUNTER — Other Ambulatory Visit: Payer: Self-pay

## 2013-08-11 MED ORDER — ANTARA 90 MG PO CAPS: 1.0000 | ORAL_CAPSULE | Freq: Every day | ORAL | Status: DC

## 2013-08-31 ENCOUNTER — Ambulatory Visit (INDEPENDENT_AMBULATORY_CARE_PROVIDER_SITE_OTHER): Payer: Managed Care, Other (non HMO) | Admitting: Family Medicine

## 2013-08-31 ENCOUNTER — Encounter: Payer: Self-pay | Admitting: Family Medicine

## 2013-08-31 VITALS — BP 134/90 | HR 76 | Temp 98.6°F | Wt >= 6400 oz

## 2013-08-31 DIAGNOSIS — I10 Essential (primary) hypertension: Secondary | ICD-10-CM

## 2013-08-31 DIAGNOSIS — E785 Hyperlipidemia, unspecified: Secondary | ICD-10-CM

## 2013-08-31 MED ORDER — METOPROLOL SUCCINATE ER 200 MG PO TB24: 200.0000 mg | ORAL_TABLET | Freq: Every day | ORAL | Status: DC

## 2013-08-31 NOTE — Progress Notes (Signed)
  Subjective:    Patient here for follow-up of elevated blood pressure.  He is exercising and is adherent to a low-salt diet.  Blood pressure is not well controlled at home. Cardiac symptoms: none. Patient denies: chest pain, chest pressure/discomfort, claudication, dyspnea, exertional chest pressure/discomfort, fatigue, irregular heart beat, lower extremity edema, near-syncope, orthopnea, palpitations, paroxysmal nocturnal dyspnea, syncope and tachypnea. Cardiovascular risk factors: hypertension, male gender, obesity (BMI >= 30 kg/m2) and sedentary lifestyle. Use of agents associated with hypertension: none. History of target organ damage: none.  The following portions of the patient's history were reviewed and updated as appropriate: allergies, current medications, past family history, past medical history, past social history, past surgical history and problem list.  Review of Systems Pertinent items are noted in HPI.     Objective:    BP 134/90  Pulse 76  Temp(Src) 98.6 F (37 C) (Oral)  Wt 407 lb (184.614 kg)  SpO2 96% General appearance: alert, cooperative, appears stated age and no distress Throat: lips, mucosa, and tongue normal; teeth and gums normal Neck: no adenopathy, supple, symmetrical, trachea midline and thyroid not enlarged, symmetric, no tenderness/mass/nodules Lungs: clear to auscultation bilaterally Heart: S1, S2 normal Extremities: extremities normal, atraumatic, no cyanosis or edema    Assessment:    Hypertension, stage 1 . Evidence of target organ damage: none.    Plan:    Medication: increase to toprol x. 200 mg. Dietary sodium restriction. Regular aerobic exercise. Check blood pressures 2-3 times weekly and record. Follow up: 3 months and as needed.

## 2013-08-31 NOTE — Patient Instructions (Signed)

## 2013-08-31 NOTE — Progress Notes (Signed)
Pre visit review using our clinic review tool, if applicable. No additional management support is needed unless otherwise documented below in the visit note. 

## 2013-09-01 ENCOUNTER — Telehealth: Payer: Self-pay | Admitting: Family Medicine

## 2013-09-01 NOTE — Telephone Encounter (Signed)
Relevant patient education mailed to patient.  

## 2013-09-04 ENCOUNTER — Telehealth: Payer: Self-pay | Admitting: Family Medicine

## 2013-09-04 NOTE — Telephone Encounter (Signed)
Relevant patient education mailed to patient.  

## 2013-11-30 ENCOUNTER — Ambulatory Visit: Payer: Managed Care, Other (non HMO) | Admitting: Family Medicine

## 2013-11-30 DIAGNOSIS — Z0289 Encounter for other administrative examinations: Secondary | ICD-10-CM

## 2014-01-03 ENCOUNTER — Other Ambulatory Visit: Payer: Self-pay | Admitting: Family Medicine

## 2014-01-04 NOTE — Telephone Encounter (Signed)
RX FAXED    KP 

## 2014-01-04 NOTE — Telephone Encounter (Signed)
LMOM @ (10:50am) asking the pt to RTC regarding med refill request.  Pt needing overdue labs.//AB/CMA

## 2014-01-13 ENCOUNTER — Other Ambulatory Visit: Payer: Self-pay | Admitting: Family Medicine

## 2014-02-25 ENCOUNTER — Other Ambulatory Visit: Payer: Self-pay | Admitting: Family Medicine

## 2014-02-26 MED ORDER — METOPROLOL SUCCINATE ER 200 MG PO TB24: ORAL_TABLET | ORAL | Status: DC

## 2014-06-08 ENCOUNTER — Other Ambulatory Visit: Payer: Self-pay | Admitting: Family Medicine

## 2014-07-11 ENCOUNTER — Other Ambulatory Visit: Payer: Self-pay | Admitting: Family Medicine

## 2014-08-17 ENCOUNTER — Other Ambulatory Visit: Payer: Self-pay | Admitting: Family Medicine

## 2014-08-17 ENCOUNTER — Ambulatory Visit (HOSPITAL_BASED_OUTPATIENT_CLINIC_OR_DEPARTMENT_OTHER)
Admission: RE | Admit: 2014-08-17 | Discharge: 2014-08-17 | Disposition: A | Discharge status: Home / Self Care | Payer: Managed Care, Other (non HMO) | Source: Ambulatory Visit | Attending: Family Medicine | Admitting: Family Medicine

## 2014-08-17 ENCOUNTER — Encounter: Payer: Self-pay | Admitting: Family Medicine

## 2014-08-17 ENCOUNTER — Ambulatory Visit (INDEPENDENT_AMBULATORY_CARE_PROVIDER_SITE_OTHER): Payer: Managed Care, Other (non HMO) | Admitting: Family Medicine

## 2014-08-17 VITALS — BP 154/94 | HR 100 | Temp 98.3°F | Wt >= 6400 oz

## 2014-08-17 DIAGNOSIS — Z87891 Personal history of nicotine dependence: Secondary | ICD-10-CM | POA: Diagnosis not present

## 2014-08-17 DIAGNOSIS — R51 Headache: Secondary | ICD-10-CM | POA: Insufficient documentation

## 2014-08-17 DIAGNOSIS — J208 Acute bronchitis due to other specified organisms: Secondary | ICD-10-CM

## 2014-08-17 DIAGNOSIS — R05 Cough: Secondary | ICD-10-CM | POA: Insufficient documentation

## 2014-08-17 DIAGNOSIS — I10 Essential (primary) hypertension: Secondary | ICD-10-CM

## 2014-08-17 DIAGNOSIS — J0110 Acute frontal sinusitis, unspecified: Secondary | ICD-10-CM

## 2014-08-17 DIAGNOSIS — E785 Hyperlipidemia, unspecified: Secondary | ICD-10-CM

## 2014-08-17 LAB — CBC WITH DIFFERENTIAL/PLATELET
BASOS PCT: 0.4 % (ref 0.0–3.0)
Basophils Absolute: 0.1 10*3/uL (ref 0.0–0.1)
EOS PCT: 1.5 % (ref 0.0–5.0)
Eosinophils Absolute: 0.2 10*3/uL (ref 0.0–0.7)
HCT: 43.5 % (ref 39.0–52.0)
HEMOGLOBIN: 14.1 g/dL (ref 13.0–17.0)
LYMPHS PCT: 24.5 % (ref 12.0–46.0)
Lymphs Abs: 2.9 10*3/uL (ref 0.7–4.0)
MCHC: 32.5 g/dL (ref 30.0–36.0)
MCV: 95.5 fl (ref 78.0–100.0)
MONOS PCT: 7.4 % (ref 3.0–12.0)
Monocytes Absolute: 0.9 10*3/uL (ref 0.1–1.0)
NEUTROS PCT: 66.2 % (ref 43.0–77.0)
Neutro Abs: 7.8 10*3/uL — ABNORMAL HIGH (ref 1.4–7.7)
Platelets: 221 10*3/uL (ref 150.0–400.0)
RBC: 4.55 Mil/uL (ref 4.22–5.81)
RDW: 13.1 % (ref 11.5–15.5)
WBC: 11.7 10*3/uL — ABNORMAL HIGH (ref 4.0–10.5)

## 2014-08-17 LAB — LIPID PANEL
Cholesterol: 181 mg/dL (ref 0–200)
HDL: 33.5 mg/dL — ABNORMAL LOW (ref >39.00)
LDL Cholesterol: 109 mg/dL — ABNORMAL HIGH (ref 0–99)
NONHDL: 147.5
Total CHOL/HDL Ratio: 5
Triglycerides: 191 mg/dL — ABNORMAL HIGH (ref 0.0–149.0)
VLDL: 38.2 mg/dL (ref 0.0–40.0)

## 2014-08-17 LAB — HEPATIC FUNCTION PANEL
ALT: 19 U/L (ref 0–53)
AST: 21 U/L (ref 0–37)
Albumin: 3.8 g/dL (ref 3.5–5.2)
Alkaline Phosphatase: 72 U/L (ref 39–117)
BILIRUBIN DIRECT: 0.1 mg/dL (ref 0.0–0.3)
Total Bilirubin: 0.4 mg/dL (ref 0.2–1.2)
Total Protein: 7.5 g/dL (ref 6.0–8.3)

## 2014-08-17 LAB — BASIC METABOLIC PANEL
BUN: 16 mg/dL (ref 6–23)
CALCIUM: 9.2 mg/dL (ref 8.4–10.5)
CO2: 28 mEq/L (ref 19–32)
CREATININE: 1.03 mg/dL (ref 0.40–1.50)
Chloride: 108 mEq/L (ref 96–112)
GFR: 104.88 mL/min (ref >60.00)
GLUCOSE: 171 mg/dL — AB (ref 70–99)
POTASSIUM: 4.2 meq/L (ref 3.5–5.1)
Sodium: 140 mEq/L (ref 135–145)

## 2014-08-17 MED ORDER — AMOXICILLIN-POT CLAVULANATE 875-125 MG PO TABS: 1.0000 | ORAL_TABLET | Freq: Two times a day (BID) | ORAL | Status: DC

## 2014-08-17 MED ORDER — FUROSEMIDE 20 MG PO TABS: 20.0000 mg | ORAL_TABLET | Freq: Every day | ORAL | Status: DC

## 2014-08-17 MED ORDER — ANTARA 90 MG PO CAPS
1.0000 | ORAL_CAPSULE | Freq: Every day | ORAL | Status: DC
Start: 2014-08-17 — End: 2014-08-17

## 2014-08-17 MED ORDER — FLUTICASONE PROPIONATE 50 MCG/ACT NA SUSP: 2.0000 | Freq: Every day | NASAL | Status: DC

## 2014-08-17 MED ORDER — AMLODIPINE BESYLATE 10 MG PO TABS: 10.0000 mg | ORAL_TABLET | Freq: Every day | ORAL | Status: DC

## 2014-08-17 MED ORDER — ATORVASTATIN CALCIUM 40 MG PO TABS: ORAL_TABLET | ORAL | Status: DC

## 2014-08-17 MED ORDER — VALSARTAN 320 MG PO TABS: ORAL_TABLET | ORAL | Status: DC

## 2014-08-17 MED ORDER — METOPROLOL SUCCINATE ER 200 MG PO TB24: ORAL_TABLET | ORAL | Status: DC

## 2014-08-17 NOTE — Progress Notes (Signed)
Pre visit review using our clinic review tool, if applicable. No additional management support is needed unless otherwise documented below in the visit note. 

## 2014-08-17 NOTE — Progress Notes (Signed)
  Subjective:     Jack Thomas is a 39 y.o. male who presents for evaluation of symptoms of a URI. Symptoms include congestion, facial pain, nasal congestion, post nasal drip, productive cough with  green colored sputum, purulent nasal discharge and sinus pressure. Onset of symptoms was 2 weeks ago, and has been gradually worsening since that time. Treatment to date: cough suppressants.  The following portions of the patient's history were reviewed and updated as appropriate:  He  has a past medical history of Hyperlipidemia; Hypertension; Sleep apnea; and Arthritis. He  does not have any pertinent problems on file. He  has past surgical history that includes Knee surgery (2009); Nasal sinus surgery (12/03/2011); and Nasal septoplasty w/ turbinoplasty (12/03/2011). His family history includes Allergies in his mother; Diabetes in his father and mother; Hyperlipidemia in his father; Hypertension in his father. There is no history of Anesthesia problems, Hypotension, Malignant hyperthermia, or Pseudochol deficiency. He  reports that he quit smoking about 3 years ago. His smoking use included Cigars. He has never used smokeless tobacco. He reports that he does not drink alcohol or use illicit drugs. He has a current medication list which includes the following prescription(s): amlodipine, antara, atorvastatin, cetirizine, furosemide, metoprolol, fish oil, and valsartan. Current Outpatient Prescriptions on File Prior to Visit  Medication Sig Dispense Refill  . cetirizine (ZYRTEC) 10 MG tablet Take 10 mg by mouth daily.    . Omega-3 Fatty Acids (FISH OIL) 1200 MG CAPS Take 1 capsule by mouth 3 (three) times daily.     No current facility-administered medications on file prior to visit.   He has No Known Allergies..  Review of Systems Pertinent items are noted in HPI.   Objective:    BP 154/94 mmHg  Pulse 100  Temp(Src) 98.3 F (36.8 C) (Oral)  Wt 409 lb (185.521 kg)  SpO2 94% General  appearance: alert, cooperative, appears stated age and no distress Ears: normal TM's and external ear canals both ears Nose: green discharge, moderate congestion, turbinates red, swollen, sinus tenderness bilateral Throat: lips, mucosa, and tongue normal; teeth and gums normal Neck: mild anterior cervical adenopathy, supple, symmetrical, trachea midline and thyroid not enlarged, symmetric, no tenderness/mass/nodules Lungs: rhonchi bilaterally Heart: S1, S2 normal Extremities: extremities normal, atraumatic, no cyanosis or edema   Assessment:    bronchitis and sinusitis   Plan:    Suggested symptomatic OTC remedies. Nasal saline spray for congestion. Augmentin per orders. Nasal steroids per orders. Follow up as needed. f/u prn   cxr

## 2014-08-20 MED ORDER — FENOFIBRATE 160 MG PO TABS: 160.0000 mg | ORAL_TABLET | Freq: Every day | ORAL | Status: DC

## 2014-08-20 NOTE — Telephone Encounter (Signed)
Patient is requesting a cheaper alternative to the [facility]. Please advise      KP

## 2014-08-20 NOTE — Telephone Encounter (Signed)
Rx faxed.    KP 

## 2014-08-20 NOTE — Addendum Note (Signed)
Addended by: Ewing Schlein on: 08/20/2014 01:55 PM   Modules accepted: Orders

## 2014-08-20 NOTE — Telephone Encounter (Signed)
Fenofibrate 160 mg 1 po qd #30  2 refills

## 2014-08-20 NOTE — Telephone Encounter (Signed)
Please review my note below.      KP

## 2014-08-22 ENCOUNTER — Other Ambulatory Visit: Payer: Self-pay | Admitting: Family Medicine

## 2014-08-22 DIAGNOSIS — R739 Hyperglycemia, unspecified: Secondary | ICD-10-CM

## 2014-08-27 ENCOUNTER — Encounter: Payer: Self-pay | Admitting: Family Medicine

## 2014-08-27 ENCOUNTER — Other Ambulatory Visit (INDEPENDENT_AMBULATORY_CARE_PROVIDER_SITE_OTHER): Payer: Managed Care, Other (non HMO)

## 2014-08-27 DIAGNOSIS — E119 Type 2 diabetes mellitus without complications: Secondary | ICD-10-CM | POA: Insufficient documentation

## 2014-08-27 DIAGNOSIS — R739 Hyperglycemia, unspecified: Secondary | ICD-10-CM

## 2014-08-27 DIAGNOSIS — E1165 Type 2 diabetes mellitus with hyperglycemia: Secondary | ICD-10-CM | POA: Insufficient documentation

## 2014-08-27 LAB — HEMOGLOBIN A1C: HEMOGLOBIN A1C: 7 % — AB (ref 4.6–6.5)

## 2014-08-30 ENCOUNTER — Encounter: Payer: Self-pay | Admitting: Family Medicine

## 2014-08-30 ENCOUNTER — Ambulatory Visit (INDEPENDENT_AMBULATORY_CARE_PROVIDER_SITE_OTHER): Payer: Managed Care, Other (non HMO) | Admitting: Family Medicine

## 2014-08-30 VITALS — BP 130/88 | HR 85 | Temp 97.9°F | Wt >= 6400 oz

## 2014-08-30 DIAGNOSIS — E1165 Type 2 diabetes mellitus with hyperglycemia: Secondary | ICD-10-CM

## 2014-08-30 DIAGNOSIS — IMO0002 Reserved for concepts with insufficient information to code with codable children: Secondary | ICD-10-CM

## 2014-08-30 NOTE — Progress Notes (Signed)
   Subjective:    Patient ID: Jack Thomas, male    DOB: [birth year], 39 y.o.   MRN: [id number]  HPI Patient here for new onset DM II.  Pt is here to learn how to use a glucometer and get some dm ed.   Past Medical History  Diagnosis Date  . Hyperlipidemia   . Hypertension     takes metoprolol daily  . Sleep apnea     sleep study - recommended cpap  . Arthritis     right knee; left shoulder    Review of Systems  Constitutional: Negative for fatigue and unexpected weight change.  Eyes: Negative for itching and visual disturbance.  Respiratory: Negative for cough and shortness of breath.   Cardiovascular: Negative for chest pain and palpitations.  Endocrine: Negative for polydipsia, polyphagia and polyuria.  Psychiatric/Behavioral: Negative for hallucinations and dysphoric mood. The patient is not nervous/anxious.        Objective:    Physical Exam  Constitutional: He appears well-developed and well-nourished. No distress.  Cardiovascular: Normal rate, regular rhythm and normal heart sounds.   Pulmonary/Chest: Effort normal and breath sounds normal. No respiratory distress.  Psychiatric: He has a normal mood and affect. His behavior is normal. Judgment and thought content normal.    BP 130/88 mmHg  Pulse 85  Temp(Src) 97.9 F (36.6 C) (Oral)  Wt 405 lb 6.4 oz (183.888 kg)  SpO2 96% Wt Readings from Last 3 Encounters:  08/30/14 405 lb 6.4 oz (183.888 kg)  08/17/14 409 lb (185.521 kg)  08/31/13 407 lb (184.614 kg)     Lab Results  Component Value Date   WBC 11.7* 08/17/2014   HGB 14.1 08/17/2014   HCT 43.5 08/17/2014   PLT 221.0 08/17/2014   GLUCOSE 171* 08/17/2014   CHOL 181 08/17/2014   TRIG 191.0* 08/17/2014   HDL 33.50* 08/17/2014   LDLDIRECT 123.1 03/10/2012   LDLCALC 109* 08/17/2014   ALT 19 08/17/2014   AST 21 08/17/2014   NA 140 08/17/2014   K 4.2 08/17/2014   CL 108 08/17/2014   CREATININE 1.03 08/17/2014   BUN 16 08/17/2014   CO2 28 08/17/2014     TSH 1.59 08/04/2012   INR 0.93 01/16/2013   HGBA1C 7.0* 08/27/2014    Dg Chest 2 View  08/17/2014   CLINICAL DATA:  Two weeks of cough headache and runny nose; previous history tobacco use  EXAM: CHEST  2 VIEW  COMPARISON:  Portable chest x-ray dated January 16, 2013  FINDINGS: A lungs are better inflated today. There is no focal infiltrate. There is no pleural effusion. The heart and pulmonary vascularity are within the limits of normal. The bony thorax is unremarkable.  IMPRESSION: There is no pneumonia nor other active cardiopulmonary disease.   Electronically Signed   By: David  Martinique   On: 08/17/2014 14:12       Assessment & Plan:   Problem List Items Addressed This Visit    Diabetes mellitus type II, uncontrolled - Primary    rto 3 months to repeat labs Pt taught how to use glucometer Dm ed given to pt      Relevant Orders   Ambulatory referral to diabetic education       Garnet Koyanagi, DO

## 2014-08-30 NOTE — Progress Notes (Signed)
Pre visit review using our clinic review tool, if applicable. No additional management support is needed unless otherwise documented below in the visit note. 

## 2014-08-30 NOTE — Patient Instructions (Signed)
Diabetes and Standards of Medical Care Diabetes is complicated. You may find that your diabetes team includes a dietitian, nurse, diabetes educator, eye doctor, and more. To help everyone know what is going on and to help you get the care you deserve, the following schedule of care was developed to help keep you on track. Below are the tests, exams, vaccines, medicines, education, and plans you will need. HbA1c test This test shows how well you have controlled your glucose over the past 2-3 months. It is used to see if your diabetes management plan needs to be adjusted.   It is performed at least 2 times a year if you are meeting treatment goals.  It is performed 4 times a year if therapy has changed or if you are not meeting treatment goals. Blood pressure test  This test is performed at every routine medical visit. The goal is less than 140/90 mm Hg for most people, but 130/80 mm Hg in some cases. Ask your health care provider about your goal. Dental exam  Follow up with the dentist regularly. Eye exam  If you are diagnosed with type 1 diabetes as a child, get an exam upon reaching the age of 61 years or older and have had diabetes for 3-5 years. Yearly eye exams are recommended after that initial eye exam.  If you are diagnosed with type 1 diabetes as an adult, get an exam within 5 years of diagnosis and then yearly.  If you are diagnosed with type 2 diabetes, get an exam as soon as possible after the diagnosis and then yearly. Foot care exam  Visual foot exams are performed at every routine medical visit. The exams check for cuts, injuries, or other problems with the feet.  A comprehensive foot exam should be done yearly. This includes visual inspection as well as assessing foot pulses and testing for loss of sensation.  Check your feet nightly for cuts, injuries, or other problems with your feet. Tell your health care provider if anything is not healing. Kidney function test (urine  microalbumin)  This test is performed once a year.  Type 1 diabetes: The first test is performed 5 years after diagnosis.  Type 2 diabetes: The first test is performed at the time of diagnosis.  A serum creatinine and estimated glomerular filtration rate (eGFR) test is done once a year to assess the level of chronic kidney disease (CKD), if present. Lipid profile (cholesterol, HDL, LDL, triglycerides)  Performed every 5 years for most people.  The goal for LDL is less than 100 mg/dL. If you are at high risk, the goal is less than 70 mg/dL.  The goal for HDL is 40 mg/dL-50 mg/dL for men and 50 mg/dL-60 mg/dL for women. An HDL cholesterol of 60 mg/dL or higher gives some protection against heart disease.  The goal for triglycerides is less than 150 mg/dL. Influenza vaccine, pneumococcal vaccine, and hepatitis B vaccine  The influenza vaccine is recommended yearly.  It is recommended that people with diabetes who are over 90 years old get the pneumonia vaccine. In some cases, two separate shots may be given. Ask your health care provider if your pneumonia vaccination is up to date.  The hepatitis B vaccine is also recommended for adults with diabetes. Diabetes self-management education  Education is recommended at diagnosis and ongoing as needed. Treatment plan  Your treatment plan is reviewed at every medical visit. Document Released: 05/17/2009 Document Revised: 12/04/2013 Document Reviewed: 12/20/2012 [facility] Patient Information 2015 [place],  [employer]. This information is not intended to replace advice given to you by your health care provider. Make sure you discuss any questions you have with your health care provider. Basic Carbohydrate Counting for Diabetes Mellitus Carbohydrate counting is a method for keeping track of the amount of carbohydrates you eat. Eating carbohydrates naturally increases the level of sugar (glucose) in your blood, so it is important for you to know the  amount that is okay for you to have in every meal. Carbohydrate counting helps keep the level of glucose in your blood within normal limits. The amount of carbohydrates allowed is different for every person. A dietitian can help you calculate the amount that is right for you. Once you know the amount of carbohydrates you can have, you can count the carbohydrates in the foods you want to eat. Carbohydrates are found in the following foods:  Grains, such as breads and cereals.  Dried beans and soy products.  Starchy vegetables, such as potatoes, peas, and corn.  Fruit and fruit juices.  Milk and yogurt.  Sweets and snack foods, such as cake, cookies, candy, chips, soft drinks, and fruit drinks. CARBOHYDRATE COUNTING There are two ways to count the carbohydrates in your food. You can use either of the methods or a combination of both. Reading the "Nutrition Facts" on Packaged Food The "Nutrition Facts" is an area that is included on the labels of almost all packaged food and beverages in the [place]. It includes the serving size of that food or beverage and information about the nutrients in each serving of the food, including the grams (g) of carbohydrate per serving.  Decide the number of servings of this food or beverage that you will be able to eat or drink. Multiply that number of servings by the number of grams of carbohydrate that is listed on the label for that serving. The total will be the amount of carbohydrates you will be having when you eat or drink this food or beverage. Learning Standard Serving Sizes of Food When you eat food that is not packaged or does not include "Nutrition Facts" on the label, you need to measure the servings in order to count the amount of carbohydrates.A serving of most carbohydrate-rich foods contains about 15 g of carbohydrates. The following list includes serving sizes of carbohydrate-rich foods that provide 15 g ofcarbohydrate per serving:   1  slice of bread (1 oz) or 1 six-inch tortilla.    of a hamburger bun or English muffin.  4-6 crackers.   cup unsweetened dry cereal.    cup hot cereal.   cup rice or pasta.    cup mashed potatoes or  of a large baked potato.  1 cup fresh fruit or one small piece of fruit.    cup canned or frozen fruit or fruit juice.  1 cup milk.   cup plain fat-free yogurt or yogurt sweetened with artificial sweeteners.   cup cooked dried beans or starchy vegetable, such as peas, corn, or potatoes.  Decide the number of standard-size servings that you will eat. Multiply that number of servings by 15 (the grams of carbohydrates in that serving). For example, if you eat 2 cups of strawberries, you will have eaten 2 servings and 30 g of carbohydrates (2 servings x 15 g = 30 g). For foods such as soups and casseroles, in which more than one food is mixed in, you will need to count the carbohydrates in each food that is included. EXAMPLE   EXAMPLE OF CARBOHYDRATE COUNTING Sample Dinner  3 oz chicken breast.   cup of brown rice.   cup of corn.  1 cup milk.   1 cup strawberries with sugar-free whipped topping.  Carbohydrate Calculation Step 1: Identify the foods that contain carbohydrates:   Rice.   Corn.   Milk.   Strawberries. Step 2:Calculate the number of servings eaten of each:   2 servings of rice.   1 serving of corn.   1 serving of milk.   1 serving of strawberries. Step 3: Multiply each of those number of servings by 15 g:   2 servings of rice x 15 g = 30 g.   1 serving of corn x 15 g = 15 g.   1 serving of milk x 15 g = 15 g.   1 serving of strawberries x 15 g = 15 g. Step 4: Add together all of the amounts to find the total grams of carbohydrates eaten: 30 g + 15 g + 15 g + 15 g = 75 g. Document Released: 07/20/2005 Document Revised: 12/04/2013 Document Reviewed: 06/16/2013 [facility] Patient Information 2015 [place], [place]. This information is  not intended to replace advice given to you by your health care provider. Make sure you discuss any questions you have with your health care provider.

## 2014-08-30 NOTE — Assessment & Plan Note (Addendum)
rto 3 months to repeat labs Pt taught how to use glucometer Dm ed given to pt

## 2014-09-05 ENCOUNTER — Telehealth: Payer: Self-pay

## 2014-09-05 ENCOUNTER — Encounter: Payer: Self-pay | Admitting: Family Medicine

## 2014-09-05 MED ORDER — ONETOUCH DELICA LANCETS 33G MISC: Status: AC

## 2014-09-05 MED ORDER — GLUCOSE BLOOD VI STRP: ORAL_STRIP | Status: AC

## 2014-09-05 NOTE — Telephone Encounter (Signed)
Pt uses OneTouch Verio machine.  He is requesting refills on test strips and lancets.  Rx's sent to [facility] on [address].

## 2014-09-05 NOTE — Telephone Encounter (Signed)
Pt requesting refill on test strips via mychart.  Called patient to inquire about the type of machine he uses.  No answer.  LVM for call back.

## 2014-09-05 NOTE — Telephone Encounter (Signed)
Pt returning your call pt states he uses One touch.

## 2014-09-06 ENCOUNTER — Encounter: Payer: Self-pay | Admitting: Dietician

## 2014-09-06 ENCOUNTER — Encounter: Payer: Managed Care, Other (non HMO) | Attending: Family Medicine | Admitting: Dietician

## 2014-09-06 VITALS — Ht 73.0 in | Wt >= 6400 oz

## 2014-09-06 DIAGNOSIS — IMO0002 Reserved for concepts with insufficient information to code with codable children: Secondary | ICD-10-CM

## 2014-09-06 DIAGNOSIS — E1165 Type 2 diabetes mellitus with hyperglycemia: Secondary | ICD-10-CM | POA: Insufficient documentation

## 2014-09-06 DIAGNOSIS — Z713 Dietary counseling and surveillance: Secondary | ICD-10-CM | POA: Diagnosis not present

## 2014-09-06 NOTE — Progress Notes (Signed)
Diabetes Self-Management Education  Visit Type:    Appt. Start Time: 1345 Appt. End Time: 1324  09/06/2014  [name], DOB [birth year] is a 39 y.o. male with a new diagnosis of Diabetes: Type 2 last week. Patient at visit alone.  He lives with business partner and nephew.  He cooks and does his own shopping.  Eats out at times.  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  His father died 4 months ago secondary to complications of DM.  His mother has DM.  He has not wanted to believe his diagnosis and wants to avoid medication.  He wants to change diet, reduce weight and change lifestyle habits to avoid complications.  Other hx includes HTN and Hyperlipidemia.  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  Weight overall has been stable recently.  Was 240 lbs at age 70 and is currently 405 lbs.  BMI is 53.8.  Patient is very large boned with increased muscle as well.  Currently is recovering from a respiratory illness and has had a poor appetite.    ASSESSMENT  Height 6\' 1"  (1.854 m), weight 405 lb (183.707 kg). Body mass index is 53.44 kg/(m^2).  Initial Visit Information:  Are you currently following a meal plan?: No   Are you taking your medications as prescribed?: Yes Are you checking your feet?: Yes How many days per week are you checking your feet?: 7 How often do you need to have someone help you when you read instructions, pamphlets, or other written materials from your doctor or pharmacy?: 1 - Never What is the last grade level you completed in school?: 12th grade of high school  Psychosocial:     Patient Belief/Attitude about Diabetes: Denial Self-care barriers: None Self-management support: [place] office, Family Other persons present: Patient Patient Concerns: Nutrition/Meal planning, Healthy Lifestyle, Glycemic Control, Weight Control Special Needs:  None Preferred Learning Style: Auditory, Visual, Hands on Learning Readiness: Ready  Complications:   Last HgB A1C per patient/outside source: 7 mg/dL How often do you check your blood sugar?: 1-2 times/day Fasting Blood glucose range (mg/dL): 130-179 Postprandial Blood glucose range (mg/dL): 130-179 Number of hypoglycemic episodes per month: 0 Number of hyperglycemic episodes per week: 0 Have you had a dilated eye exam in the past 12 months?: Yes Have you had a dental exam in the past 12 months?: No  Diet Intake:  Breakfast: eggs, [place] bacon or pork bacon with 2 slices toast or  bagel and cream cheese with coffee with 4 heaping tsp sugars Lunch: rice and vegetables and grilled chicken or bojangles (mostly out to eat) or hamburgers twice weekly Snack (afternoon): nuts Dinner: baked chicken or fish, salad and veges; or beef stew  Beverage(s): coffee with 4 heaping tsp sugar, regular fountain drink, juic (16 oz)  Exercise:  Exercise: Light (walking / raking leaves) (has started going to the gym 1- 1/2 hours (35 minutes of cardio)) Light Exercise amount of time (min / week): 150 (has a pedometer on phone that tracks the number of steps)  Individualized Plan for Diabetes Self-Management Training:   Learning Objective:  Patient will have a greater understanding of diabetes self-management.  Patient education plan per assessed needs and concerns is to attend individual sessions for     Education Topics Reviewed with Patient Today:  Definition of diabetes, type 1 and 2, and the diagnosis of diabetes Role  of diet in the treatment of diabetes and the relationship between the three main macronutrients and blood glucose level, Food label reading, portion sizes and measuring food., Carbohydrate counting, Information on hints to eating out and maintain blood glucose control., Meal options for control of blood glucose level and chronic complications. Role of exercise on diabetes management,  blood pressure control and cardiac health.   Identified appropriate SMBG and/or A1C goals., Daily foot exams, Yearly dilated eye exam     Role of stress on diabetes, Helped patient identify a support system for diabetes management    (Congratulated patient on quitting smoking 4 months ago)  PATIENTS GOALS/Plan (Developed by the patient):  Nutrition: General guidelines for healthy choices and portions discussed Physical Activity: Exercise 5-7 days per week, 60 minutes per day Medications: take my medication as prescribed Monitoring : test my blood glucose as discussed (note x per day with comment) Reducing Risk: do foot checks daily, get labs drawn Health Coping: discuss diabetes with (comment)  Plan:   Patient Instructions  Plan:  Aim for 4 Carb Choices per meal (60 grams) +/- 1 either way  Aim for 0-2 Carbs per snack if hungry  Include protein in moderation with your meals and snacks Consider reading food labels for Total Carbohydrate and Fat Grams of foods Consider continuing to increase your exercise to 300 minutes per week of aerobic exercise and continue weight training 3 times per week.  [employer] job so far with changes you have made!) Be mindful of food choices and portion amounts. Bake, broil or grill rather than fry. Consider checking BG at alternate times per day as directed by MD  Consider taking medication  as directed by MD  Rethink your drink.  Drink more water.  Change sweetener in coffee.  Fruit or water rather than juice.     Expected Outcomes:   Weight loss and improvement of HgbA1C  Education material provided: Living Well with Diabetes, Meal plan card, Snack sheet and Support group flyer  If problems or questions, patient to contact team via:  Phone or e-mail  Future DSME appointment: PRN

## 2014-09-06 NOTE — Patient Instructions (Signed)
Plan:  Aim for 4 Carb Choices per meal (60 grams) +/- 1 either way  Aim for 0-2 Carbs per snack if hungry  Include protein in moderation with your meals and snacks Consider reading food labels for Total Carbohydrate and Fat Grams of foods Consider continuing to increase your exercise to 300 minutes per week of aerobic exercise and continue weight training 3 times per week.  [employer] job so far with changes you have made!) Be mindful of food choices and portion amounts. Bake, broil or grill rather than fry. Consider checking BG at alternate times per day as directed by MD  Consider taking medication  as directed by MD  Rethink your drink.  Drink more water.  Change sweetener in coffee.  Fruit or water rather than juice.

## 2014-10-29 ENCOUNTER — Encounter: Payer: Self-pay | Admitting: Family Medicine

## 2014-11-05 ENCOUNTER — Encounter: Payer: Self-pay | Admitting: Medical

## 2014-11-05 ENCOUNTER — Encounter: Payer: Managed Care, Other (non HMO) | Admitting: Medical

## 2014-11-05 VITALS — HR 77 | Temp 97.9°F | Wt 396.2 lb

## 2014-11-05 DIAGNOSIS — I10 Essential (primary) hypertension: Secondary | ICD-10-CM

## 2014-11-05 NOTE — Progress Notes (Signed)
This encounter was created in error - please disregard.

## 2014-11-05 NOTE — Progress Notes (Signed)
Pre visit review using our clinic review tool, if applicable. No additional management support is needed unless otherwise documented below in the visit note. 

## 2014-12-10 ENCOUNTER — Ambulatory Visit (INDEPENDENT_AMBULATORY_CARE_PROVIDER_SITE_OTHER): Payer: Managed Care, Other (non HMO) | Admitting: Family Medicine

## 2014-12-10 ENCOUNTER — Encounter: Payer: Self-pay | Admitting: Family Medicine

## 2014-12-10 VITALS — BP 134/96 | HR 69 | Temp 99.3°F | Wt >= 6400 oz

## 2014-12-10 DIAGNOSIS — E1165 Type 2 diabetes mellitus with hyperglycemia: Secondary | ICD-10-CM | POA: Diagnosis not present

## 2014-12-10 DIAGNOSIS — E785 Hyperlipidemia, unspecified: Secondary | ICD-10-CM | POA: Diagnosis not present

## 2014-12-10 DIAGNOSIS — I10 Essential (primary) hypertension: Secondary | ICD-10-CM | POA: Diagnosis not present

## 2014-12-10 DIAGNOSIS — J020 Streptococcal pharyngitis: Secondary | ICD-10-CM

## 2014-12-10 DIAGNOSIS — R52 Pain, unspecified: Secondary | ICD-10-CM | POA: Diagnosis not present

## 2014-12-10 DIAGNOSIS — J209 Acute bronchitis, unspecified: Secondary | ICD-10-CM

## 2014-12-10 DIAGNOSIS — J029 Acute pharyngitis, unspecified: Secondary | ICD-10-CM | POA: Diagnosis not present

## 2014-12-10 DIAGNOSIS — IMO0002 Reserved for concepts with insufficient information to code with codable children: Secondary | ICD-10-CM

## 2014-12-10 LAB — POCT RAPID STREP A (OFFICE): Rapid Strep A Screen: POSITIVE — AB

## 2014-12-10 LAB — POCT INFLUENZA A/B
Influenza A, POC: NEGATIVE
Influenza B, POC: NEGATIVE

## 2014-12-10 MED ORDER — AMOXICILLIN-POT CLAVULANATE 875-125 MG PO TABS: 1.0000 | ORAL_TABLET | Freq: Two times a day (BID) | ORAL | Status: DC

## 2014-12-10 MED ORDER — ALBUTEROL SULFATE 108 (90 BASE) MCG/ACT IN AEPB: 2.0000 | INHALATION_SPRAY | Freq: Four times a day (QID) | RESPIRATORY_TRACT | Status: DC | PRN

## 2014-12-10 NOTE — Assessment & Plan Note (Signed)
con't lipitor and fenofibrae Check labs

## 2014-12-10 NOTE — Progress Notes (Signed)
Patient ID: Jack Thomas, male    DOB: Jan 13, 1976  Age: 39 y.o. MRN: [id number]    Subjective:  Subjective HPI Jack Thomas presents for f/u dm, and c/o sore throat and fever.    Review of Systems  Constitutional: Positive for fever. Negative for diaphoresis, appetite change, fatigue and unexpected weight change.  HENT: Positive for congestion, postnasal drip, rhinorrhea and sore throat. Negative for sinus pressure and sneezing.   Eyes: Negative for pain, redness and visual disturbance.  Respiratory: Positive for cough, shortness of breath and wheezing. Negative for chest tightness.   Cardiovascular: Negative for chest pain, palpitations and leg swelling.  Endocrine: Negative for cold intolerance, heat intolerance, polydipsia, polyphagia and polyuria.  Genitourinary: Negative for dysuria, frequency and difficulty urinating.  Neurological: Negative for dizziness, light-headedness, numbness and headaches.    History Past Medical History  Diagnosis Date  . Hyperlipidemia   . Hypertension     takes metoprolol daily  . Sleep apnea     sleep study - recommended cpap  . Arthritis     right knee; left shoulder  . Diabetes mellitus without complication     He has past surgical history that includes Knee surgery (2009); Nasal sinus surgery (12/03/2011); and Nasal septoplasty w/ turbinoplasty (12/03/2011).   His family history includes Allergies in his mother; Diabetes in his father and mother; Hyperlipidemia in his father; Hypertension in his father. There is no history of Anesthesia problems, Hypotension, Malignant hyperthermia, or Pseudochol deficiency.He reports that he quit smoking about 4 years ago. His smoking use included Cigars. He has never used smokeless tobacco. He reports that he does not drink alcohol or use illicit drugs.  Current Outpatient Prescriptions on File Prior to Visit  Medication Sig Dispense Refill  . amLODipine (NORVASC) 10 MG tablet Take 1 tablet (10 mg total) by  mouth daily. 90 tablet 3  . atorvastatin (LIPITOR) 40 MG tablet 1 TAB BY MOUTH AT BEDTIME 90 tablet 1  . cetirizine (ZYRTEC) 10 MG tablet Take 10 mg by mouth daily.    . fenofibrate 160 MG tablet Take 1 tablet (160 mg total) by mouth daily. 30 tablet 2  . fluticasone (FLONASE) 50 MCG/ACT nasal spray Place 2 sprays into both nostrils daily. 16 g 6  . furosemide (LASIX) 20 MG tablet Take 1 tablet (20 mg total) by mouth daily. 90 tablet 1  . glucose blood (ONETOUCH VERIO) test strip Check blood sugars once daily and as needed. 100 each 12  . metoprolol (TOPROL XL) 200 MG 24 hr tablet 1 tab by mouth daily-- 90 tablet 1  . Omega-3 Fatty Acids (FISH OIL) 1200 MG CAPS Take 1 capsule by mouth 3 (three) times daily.    Glory Rosebush DELICA LANCETS 22W MISC Check blood sugars once daily and as needed. 100 each 12  . valsartan (DIOVAN) 320 MG tablet TAKE 1 TAB BY MOUTH DAILY- 90 tablet 1   No current facility-administered medications on file prior to visit.     Objective:  Objective Physical Exam  Constitutional: He is oriented to person, place, and time. Vital signs are normal. He appears well-developed and well-nourished. He is sleeping.  HENT:  Head: Normocephalic and atraumatic.  Right Ear: External ear normal.  Left Ear: External ear normal.  Mouth/Throat: Oropharyngeal exudate present.  + PND + errythema  Eyes: Conjunctivae and EOM are normal. Pupils are equal, round, and reactive to light. Right eye exhibits no discharge. Left eye exhibits no discharge.  Neck: Normal range of motion. Neck  supple. No thyromegaly present.  Cardiovascular: Normal rate, regular rhythm and normal heart sounds.   No murmur heard. Pulmonary/Chest: Effort normal and breath sounds normal. No respiratory distress. He has no wheezes. He has no rales. He exhibits no tenderness.  Musculoskeletal: He exhibits no edema or tenderness.  Lymphadenopathy:    He has cervical adenopathy.  Neurological: He is alert and oriented  to person, place, and time.  Skin: Skin is warm and dry.  Psychiatric: He has a normal mood and affect. His behavior is normal. Judgment and thought content normal.   BP 134/96 mmHg  Pulse 69  Temp(Src) 99.3 F (37.4 C) (Oral)  Wt 404 lb 3.2 oz (183.344 kg)  SpO2 96% Wt Readings from Last 3 Encounters:  12/10/14 404 lb 3.2 oz (183.344 kg)  11/05/14 396 lb 3.2 oz (179.715 kg)  09/06/14 405 lb (183.707 kg)     Lab Results  Component Value Date   WBC 11.7* 08/17/2014   HGB 14.1 08/17/2014   HCT 43.5 08/17/2014   PLT 221.0 08/17/2014   GLUCOSE 171* 08/17/2014   CHOL 181 08/17/2014   TRIG 191.0* 08/17/2014   HDL 33.50* 08/17/2014   LDLDIRECT 123.1 03/10/2012   LDLCALC 109* 08/17/2014   ALT 19 08/17/2014   AST 21 08/17/2014   NA 140 08/17/2014   K 4.2 08/17/2014   CL 108 08/17/2014   CREATININE 1.03 08/17/2014   BUN 16 08/17/2014   CO2 28 08/17/2014   TSH 1.59 08/04/2012   INR 0.93 01/16/2013   HGBA1C 7.0* 08/27/2014    Dg Chest 2 View  08/17/2014   CLINICAL DATA:  Two weeks of cough headache and runny nose; previous history tobacco use  EXAM: CHEST  2 VIEW  COMPARISON:  Portable chest x-ray dated January 16, 2013  FINDINGS: A lungs are better inflated today. There is no focal infiltrate. There is no pleural effusion. The heart and pulmonary vascularity are within the limits of normal. The bony thorax is unremarkable.  IMPRESSION: There is no pneumonia nor other active cardiopulmonary disease.   Electronically Signed   By: David  Martinique   On: 08/17/2014 14:12     Assessment & Plan:  Plan I am having Jack Thomas start on amoxicillin-clavulanate and Albuterol Sulfate. I am also having him maintain his cetirizine, Fish Oil, valsartan, metoprolol, amLODipine, atorvastatin, furosemide, fluticasone, fenofibrate, glucose blood, and ONETOUCH DELICA LANCETS 67H.  Meds ordered this encounter  Medications  . amoxicillin-clavulanate (AUGMENTIN) 875-125 MG per tablet    Sig: Take 1  tablet by mouth 2 (two) times daily.    Dispense:  20 tablet    Refill:  0  . Albuterol Sulfate (PROAIR RESPICLICK) 419 (90 BASE) MCG/ACT AEPB    Sig: Inhale 2 puffs into the lungs 4 (four) times daily as needed.    Dispense:  1 each    Refill:  2    Problem List Items Addressed This Visit    Hyperlipidemia    con't lipitor and fenofibrae Check labs      Relevant Orders   Basic metabolic panel   Hemoglobin A1c   Hepatic function panel   Lipid panel   Microalbumin / creatinine urine ratio   POCT urinalysis dipstick   Essential hypertension    con't norvasc, metoprolol diovan stable      Relevant Orders   Basic metabolic panel   Hemoglobin A1c   Hepatic function panel   Lipid panel   Microalbumin / creatinine urine ratio   POCT urinalysis  dipstick   Diabetes mellitus type II, uncontrolled - Primary   Relevant Orders   Basic metabolic panel   Hemoglobin A1c   Hepatic function panel   Lipid panel   Microalbumin / creatinine urine ratio   POCT urinalysis dipstick    Other Visit Diagnoses    Acute bronchitis, unspecified organism        Relevant Medications    amoxicillin-clavulanate (AUGMENTIN) 875-125 MG per tablet    Albuterol Sulfate (PROAIR RESPICLICK) 765 (90 BASE) MCG/ACT AEPB    Streptococcal sore throat        Relevant Medications    amoxicillin-clavulanate (AUGMENTIN) 875-125 MG per tablet    Sore throat        Relevant Orders    POCT rapid strep A (Completed)    Body aches        Relevant Orders    POCT Influenza A/B (Completed)       Follow-up: Return in about 3 months (around 03/12/2015), or if symptoms worsen or fail to improve.  Garnet Koyanagi, DO

## 2014-12-10 NOTE — Assessment & Plan Note (Signed)
con't norvasc, metoprolol diovan stable

## 2014-12-10 NOTE — Patient Instructions (Signed)

## 2014-12-10 NOTE — Progress Notes (Signed)
Pre visit review using our clinic review tool, if applicable. No additional management support is needed unless otherwise documented below in the visit note. 

## 2015-04-16 ENCOUNTER — Other Ambulatory Visit: Payer: Self-pay | Admitting: Family Medicine

## 2015-06-04 ENCOUNTER — Other Ambulatory Visit: Payer: Self-pay | Admitting: Family Medicine

## 2015-09-14 ENCOUNTER — Other Ambulatory Visit: Payer: Self-pay | Admitting: Family Medicine

## 2015-09-16 NOTE — Telephone Encounter (Signed)
Please schedule this patient an diabetes and Htn visit/fasting.     KP

## 2015-09-16 NOTE — Telephone Encounter (Signed)
Called patient and scheduled him to come in on 10/01/2015

## 2015-10-01 ENCOUNTER — Encounter: Payer: Self-pay | Admitting: Family Medicine

## 2015-10-01 ENCOUNTER — Ambulatory Visit (INDEPENDENT_AMBULATORY_CARE_PROVIDER_SITE_OTHER): Payer: Managed Care, Other (non HMO) | Admitting: Family Medicine

## 2015-10-01 VITALS — BP 142/92 | HR 91 | Temp 99.0°F | Ht 73.0 in | Wt 390.4 lb

## 2015-10-01 DIAGNOSIS — E785 Hyperlipidemia, unspecified: Secondary | ICD-10-CM | POA: Diagnosis not present

## 2015-10-01 DIAGNOSIS — E118 Type 2 diabetes mellitus with unspecified complications: Secondary | ICD-10-CM

## 2015-10-01 DIAGNOSIS — I10 Essential (primary) hypertension: Secondary | ICD-10-CM

## 2015-10-01 DIAGNOSIS — G4733 Obstructive sleep apnea (adult) (pediatric): Secondary | ICD-10-CM | POA: Diagnosis not present

## 2015-10-01 NOTE — Progress Notes (Signed)
Pre visit review using our clinic review tool, if applicable. No additional management support is needed unless otherwise documented below in the visit note. 

## 2015-10-01 NOTE — Progress Notes (Signed)
Patient ID: Jack Thomas, male    DOB: 1975-09-22  Age: 40 y.o. MRN: [id number]    Subjective:  Subjective HPI Jack Thomas presents for f/u dm, htn and cholesterol.  No complaints.   HYPERTENSION  Blood pressure range-not checking  Chest pain- no      Dyspnea- no Lightheadedness- no   Edema- no Other side effects - no   Medication compliance: good Low salt diet- yes  DIABETES  Blood Sugar ranges-85-109  Polyuria- no New Visual problems- no Hypoglycemic symptoms- no Other side effects-no Medication compliance - good Last eye exam- over 1 year Foot exam- today  HYPERLIPIDEMIA  Medication compliance- good RUQ pain- no  Muscle aches- no Other side effects-n0  Review of Systems  Constitutional: Negative for diaphoresis, appetite change, fatigue and unexpected weight change.  Eyes: Negative for pain, redness and visual disturbance.  Respiratory: Negative for cough, chest tightness, shortness of breath and wheezing.   Cardiovascular: Negative for chest pain, palpitations and leg swelling.  Endocrine: Negative for cold intolerance, heat intolerance, polydipsia, polyphagia and polyuria.  Genitourinary: Negative for dysuria, frequency and difficulty urinating.  Neurological: Negative for dizziness, light-headedness, numbness and headaches.    History Past Medical History  Diagnosis Date  . Hyperlipidemia   . Hypertension     takes metoprolol daily  . Sleep apnea     sleep study - recommended cpap  . Arthritis     right knee; left shoulder  . Diabetes mellitus without complication Jack Thomas)     He has past surgical history that includes Knee surgery (2009); Nasal sinus surgery (12/03/2011); and Nasal septoplasty w/ turbinoplasty (12/03/2011).   His family history includes Allergies in his mother; Diabetes in his father and mother; Hyperlipidemia in his father; Hypertension in his father. There is no history of Anesthesia problems, Hypotension, Malignant hyperthermia, or  Pseudochol deficiency.He reports that he quit smoking about 4 years ago. His smoking use included Cigars. He has never used smokeless tobacco. He reports that he does not drink alcohol or use illicit drugs.  Current Outpatient Prescriptions on File Prior to Visit  Medication Sig Dispense Refill  . Albuterol Sulfate (PROAIR RESPICLICK) 527 (90 BASE) MCG/ACT AEPB Inhale 2 puffs into the lungs 4 (four) times daily as needed. 1 each 2  . fluticasone (FLONASE) 50 MCG/ACT nasal spray Place 2 sprays into both nostrils daily. 16 g 6  . furosemide (LASIX) 20 MG tablet TAKE 1 TABLET (20 MG TOTAL) BY MOUTH DAILY. 90 tablet 0  . glucose blood (ONETOUCH VERIO) test strip Check blood sugars once daily and as needed. 100 each 12  . metoprolol (TOPROL-XL) 200 MG 24 hr tablet TAKE 1 TABLET BY MOUTH DAILY 90 tablet 0  . ONETOUCH DELICA LANCETS 78E MISC Check blood sugars once daily and as needed. 100 each 12  . valsartan (DIOVAN) 320 MG tablet TAKE 1 TABLET BY MOUTH DAILY- 90 tablet 0  . amLODipine (NORVASC) 10 MG tablet Take 1 tablet (10 mg total) by mouth daily. (Patient not taking: Reported on 10/01/2015) 90 tablet 3   No current facility-administered medications on file prior to visit.     Objective:  Objective Physical Exam  Constitutional: He is oriented to person, place, and time. Vital signs are normal. He appears well-developed and well-nourished. He is sleeping.  HENT:  Head: Normocephalic and atraumatic.  Mouth/Throat: Oropharynx is clear and moist.  Eyes: EOM are normal. Pupils are equal, round, and reactive to light.  Neck: Normal range of motion. Neck supple. No  thyromegaly present.  Cardiovascular: Normal rate and regular rhythm.   No murmur heard. Pulmonary/Chest: Effort normal and breath sounds normal. No respiratory distress. He has no wheezes. He has no rales. He exhibits no tenderness.  Musculoskeletal: He exhibits no edema or tenderness.  Neurological: He is alert and oriented to  person, place, and time.  Skin: Skin is warm and dry.  Psychiatric: He has a normal mood and affect. His behavior is normal. Judgment and thought content normal.  Nursing note and vitals reviewed.  BP 142/92 mmHg  Pulse 91  Temp(Src) 99 F (37.2 C) (Oral)  Ht 6' 1" (1.854 m)  Wt 390 lb 6.4 oz (177.084 kg)  BMI 51.52 kg/m2  SpO2 95% Wt Readings from Last 3 Encounters:  10/01/15 390 lb 6.4 oz (177.084 kg)  12/10/14 404 lb 3.2 oz (183.344 kg)  11/05/14 396 lb 3.2 oz (179.715 kg)     Lab Results  Component Value Date   WBC 11.7* 08/17/2014   HGB 14.1 08/17/2014   HCT 43.5 08/17/2014   PLT 221.0 08/17/2014   GLUCOSE 116* 10/01/2015   CHOL 180 10/01/2015   TRIG 202.0* 10/01/2015   HDL 32.10* 10/01/2015   LDLDIRECT 108.0 10/01/2015   LDLCALC 109* 08/17/2014   ALT 18 10/01/2015   AST 20 10/01/2015   NA 140 10/01/2015   K 4.1 10/01/2015   CL 103 10/01/2015   CREATININE 1.01 10/01/2015   BUN 14 10/01/2015   CO2 31 10/01/2015   TSH 1.59 08/04/2012   INR 0.93 01/16/2013   HGBA1C 6.4 10/01/2015    Dg Chest 2 View  08/17/2014  CLINICAL DATA:  Two weeks of cough headache and runny nose; previous history tobacco use EXAM: CHEST  2 VIEW COMPARISON:  Portable chest x-ray dated January 16, 2013 FINDINGS: A lungs are better inflated today. There is no focal infiltrate. There is no pleural effusion. The heart and pulmonary vascularity are within the limits of normal. The bony thorax is unremarkable. IMPRESSION: There is no pneumonia nor other active cardiopulmonary disease. Electronically Signed   By: Jack  Thomas   On: 08/17/2014 14:12     Assessment & Plan:  Plan I have discontinued Jack Thomas's cetirizine, Fish Oil, fenofibrate, and amoxicillin-clavulanate. I am also having him maintain his amLODipine, fluticasone, glucose blood, ONETOUCH DELICA LANCETS 62I, Albuterol Sulfate, furosemide, metoprolol, valsartan, and atorvastatin.  Meds ordered this encounter  Medications  .  atorvastatin (LIPITOR) 40 MG tablet    Sig: Take 40 mg by mouth daily.    Problem List Items Addressed This Visit    Essential hypertension   Relevant Medications   atorvastatin (LIPITOR) 40 MG tablet   Hyperlipidemia   Relevant Medications   atorvastatin (LIPITOR) 40 MG tablet   Other Relevant Orders   Lipid panel (Completed)   Comp Met (CMET) (Completed)   Hemoglobin A1c (Completed)    Other Visit Diagnoses    Controlled type 2 diabetes mellitus with complication, without long-term current use of insulin (HCC)    -  Primary    Relevant Medications    atorvastatin (LIPITOR) 40 MG tablet    Other Relevant Orders    Lipid panel (Completed)    Comp Met (CMET) (Completed)    Hemoglobin A1c (Completed)    Obstructive sleep apnea        Relevant Orders    Ambulatory referral to Pulmonology       Follow-up: Return in about 6 months (around 03/30/2016), or if symptoms worsen or fail to  improve, for htn, hyperlipidemia, DM.  Garnet Koyanagi, DO

## 2015-10-01 NOTE — Patient Instructions (Signed)

## 2015-10-02 LAB — COMPREHENSIVE METABOLIC PANEL
ALT: 18 U/L (ref 0–53)
AST: 20 U/L (ref 0–37)
Albumin: 4.3 g/dL (ref 3.5–5.2)
Alkaline Phosphatase: 69 U/L (ref 39–117)
BILIRUBIN TOTAL: 0.7 mg/dL (ref 0.2–1.2)
BUN: 14 mg/dL (ref 6–23)
CALCIUM: 9.4 mg/dL (ref 8.4–10.5)
CHLORIDE: 103 meq/L (ref 96–112)
CO2: 31 meq/L (ref 19–32)
Creatinine, Ser: 1.01 mg/dL (ref 0.40–1.50)
GFR: 106.62 mL/min (ref >60.00)
GLUCOSE: 116 mg/dL — AB (ref 70–99)
POTASSIUM: 4.1 meq/L (ref 3.5–5.1)
Sodium: 140 mEq/L (ref 135–145)
Total Protein: 7.8 g/dL (ref 6.0–8.3)

## 2015-10-02 LAB — LIPID PANEL
CHOL/HDL RATIO: 6
CHOLESTEROL: 180 mg/dL (ref 0–200)
HDL: 32.1 mg/dL — ABNORMAL LOW (ref >39.00)
NONHDL: 148.15
TRIGLYCERIDES: 202 mg/dL — AB (ref 0.0–149.0)
VLDL: 40.4 mg/dL — AB (ref 0.0–40.0)

## 2015-10-02 LAB — LDL CHOLESTEROL, DIRECT: LDL DIRECT: 108 mg/dL

## 2015-10-02 LAB — HEMOGLOBIN A1C: Hgb A1c MFr Bld: 6.4 % (ref 4.6–6.5)

## 2015-10-02 NOTE — Assessment & Plan Note (Addendum)
Stable con't diovan, metoprolol and norvasc

## 2015-10-02 NOTE — Assessment & Plan Note (Signed)
Cont lipitor. Check labs.

## 2015-10-25 ENCOUNTER — Other Ambulatory Visit: Payer: Self-pay | Admitting: Family Medicine

## 2015-11-13 ENCOUNTER — Encounter: Payer: Self-pay | Admitting: Pulmonary Disease

## 2015-11-13 ENCOUNTER — Ambulatory Visit (INDEPENDENT_AMBULATORY_CARE_PROVIDER_SITE_OTHER): Payer: Managed Care, Other (non HMO) | Admitting: Pulmonary Disease

## 2015-11-13 VITALS — BP 128/78 | HR 77 | Ht 73.0 in | Wt >= 6400 oz

## 2015-11-13 DIAGNOSIS — J309 Allergic rhinitis, unspecified: Secondary | ICD-10-CM

## 2015-11-13 DIAGNOSIS — G4733 Obstructive sleep apnea (adult) (pediatric): Secondary | ICD-10-CM

## 2015-11-13 NOTE — Assessment & Plan Note (Signed)
Observing. Some sinus issues. May need flonase.

## 2015-11-13 NOTE — Assessment & Plan Note (Signed)
Weight reduction 

## 2015-11-13 NOTE — Progress Notes (Signed)
Subjective:    Patient ID: Delonta Malveaux, male    DOB: 10-25-1975, 40 y.o.   MRN: [id number]  HPI   This is the case of Marisa Lathe, 40 y.o. Male, who was referred by Dr. Lyndal Pulley in consultation regarding OSA.    As you very well know, patient was diagnosed with OSA. He had a diagnostic sleep study done on 04/24/2009 which showed moderate sleep apnea with AHI of 19. He never tried CPAP 2/2 insurance not covering it. He has gained 30 lbs since 2010.   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   Works night shift. 8pm until 6am, 5d/week. Sleeps 6 hrs. Drives a forklift.  Has witnessed apnea, gasping, choking.  Unrefreshed sleep. Hypersomnia affects fxnality.  (-) abn behavior in sleep.    ESS 15                                                                                                                                                                       Review of Systems  Constitutional: Negative.  Negative for fever and unexpected weight change.  HENT: Positive for congestion, postnasal drip and sinus pressure. Negative for dental problem, ear pain, nosebleeds, rhinorrhea, sneezing, sore throat and trouble swallowing.   Eyes: Positive for itching. Negative for redness.  Respiratory: Positive for shortness of breath and wheezing. Negative for cough and chest tightness.   Cardiovascular: Negative.  Negative for palpitations and leg swelling.  Gastrointestinal: Negative.  Negative for nausea and vomiting.  Endocrine: Negative.   Genitourinary: Negative.  Negative for dysuria.  Musculoskeletal: Positive for arthralgias. Negative for joint swelling.  Skin: Negative.  Negative for rash.  Allergic/Immunologic: Positive for environmental allergies.  Neurological: Positive for headaches.  Hematological: Negative.  Does not bruise/bleed easily.  Psychiatric/Behavioral: Negative.  Negative for dysphoric mood. The patient is not nervous/anxious.    Past Medical  History  Diagnosis Date  . Hyperlipidemia   . Hypertension     takes metoprolol daily  . Sleep apnea     sleep study - recommended cpap  . Arthritis     right knee; left shoulder  . Diabetes mellitus without complication (HCC)    (-) DVT, CA  Family History  Problem Relation Age of Onset  . Diabetes Mother   . Allergies Mother   . Hyperlipidemia Father   . Hypertension Father   . Diabetes Father   . Anesthesia problems Neg Hx   . Hypotension Neg Hx   . Malignant hyperthermia Neg Hx   . Pseudochol deficiency Neg Hx      Past Surgical History  Procedure Laterality Date  . Knee surgery  2009    right  . Nasal sinus surgery  12/03/2011    Procedure: ENDOSCOPIC SINUS SURGERY;  Surgeon: Izora Gala, MD;  Location: [facility];  Service: ENT;  Laterality: Bilateral;  . Nasal septoplasty w/ turbinoplasty  12/03/2011    Procedure: NASAL SEPTOPLASTY WITH TURBINATE REDUCTION;  Surgeon: Izora Gala, MD;  Location: [facility];  Service: ENT;  Laterality: Bilateral;    Social History   Social History  . Marital Status: Single    Spouse Name: N/A  . Number of Children: 0  . Years of Education: N/A   Occupational History  . Forklift driver [employer]   Social History Main Topics  . Smoking status: Former Smoker -- 0.50 packs/day for 6 years    Types: Cigars    Quit date: 11/02/2010  . Smokeless tobacco: Never Used  . Alcohol Use: No  . Drug Use: No  . Sexual Activity: Yes   Other Topics Concern  . Not on file   Social History Narrative   smokes 1 PPW. (+) ETOH.   No Known Allergies   Outpatient Prescriptions Prior to Visit  Medication Sig Dispense Refill  . Albuterol Sulfate (PROAIR RESPICLICK) 123XX123 (90 BASE) MCG/ACT AEPB Inhale 2 puffs into the lungs 4 (four) times daily as needed. 1 each 2  . atorvastatin (LIPITOR) 40 MG tablet Take 40 mg by mouth daily.    . fluticasone (FLONASE) 50 MCG/ACT nasal spray Place 2 sprays into both nostrils daily. 16 g 6  . furosemide (LASIX) 20  MG tablet TAKE 1 TABLET (20 MG TOTAL) BY MOUTH DAILY. 90 tablet 0  . glucose blood (ONETOUCH VERIO) test strip Check blood sugars once daily and as needed. 100 each 12  . metoprolol (TOPROL-XL) 200 MG 24 hr tablet TAKE 1 TABLET BY MOUTH DAILY 90 tablet 0  . ONETOUCH DELICA LANCETS 99991111 MISC Check blood sugars once daily and as needed. 100 each 12  . valsartan (DIOVAN) 320 MG tablet TAKE 1 TABLET BY MOUTH DAILY- 90 tablet 0  . amLODipine (NORVASC) 10 MG tablet Take 1 tablet (10 mg total) by mouth daily. (Patient not taking: Reported on 10/01/2015) 90 tablet 3   No facility-administered medications prior to visit.   No orders of the defined types were placed in this encounter.           Objective:   Physical Exam  Vitals:  Filed Vitals:   11/13/15 1034  BP: 128/78  Pulse: 77  Height: 6\' 1"  (1.854 m)  Weight: 400 lb (181.439 kg)  SpO2: 93%    Constitutional/General:  Pleasant, well-nourished, well-developed, not in any distress,  Comfortably seating.  Well kempt  Body mass index is 52.79 kg/(m^2). Wt Readings from Last 3 Encounters:  11/13/15 400 lb (181.439 kg)  10/01/15 390 lb 6.4 oz (177.084 kg)  12/10/14 404 lb 3.2 oz (183.344 kg)    Neck circumference:  22 inches.   HEENT: Pupils equal and reactive to light and accommodation. Anicteric sclerae. Normal nasal mucosa.   No oral  lesions,  mouth clear,  oropharynx clear, no postnasal drip. (-) Oral thrush. No dental caries.  Airway - Mallampati class III  Neck: No masses. Midline trachea. No JVD, (-) LAD. (-) bruits appreciated.  Respiratory/Chest: Grossly normal chest. (-) deformity. (-) Accessory muscle use.  Symmetric expansion. (-) Tenderness on palpation.  Resonant on percussion.  Diminished BS on both lower lung zones. (-) wheezing, crackles, rhonchi (-) egophony  Cardiovascular: Regular rate and  rhythm, heart sounds normal, no murmur or gallops, no peripheral edema  Gastrointestinal:  Normal bowel  sounds. Soft, non-tender. No hepatosplenomegaly.  (-) masses.   Musculoskeletal:  Normal muscle tone. Normal gait.   Extremities: Grossly normal. (-) clubbing, cyanosis.  (-) edema  Skin: (-) rash,lesions seen.   Neurological/Psychiatric : alert, oriented to time, place, person. Normal mood and affect           Assessment & Plan:  Obstructive sleep apnea NPSG  04/30/09   AHI 19/hr  moderate obstructive sleep apnea/hypoxia syndrome He never followed up for treatment. Has hypersomnia, snring, fatigue. Gets sleepy at work. 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  Plan : 1. Autocpap 5-15. May need bipap. May need a new sleep study (HST) if 2010 study will not work.  2. Needs good compliance and DL.  3. Needs 1 month DL > make sure he iscorrected and compliant prior to CDL.  4. Sleep hygiene.    Chronic allergic rhinitis Observing. Some sinus issues. May need flonase.   Severe obesity (BMI >= 40) Weight reduction.      Thank you very much for letting me participate in this patient's care. Please do not hesitate to give me a call if you have any questions or concerns regarding the treatment plan.   Patient will follow up with me in 2 mos.     Monica Becton, MD 11/13/2015   11:10 AM [facility] [place] Pager: [phone] Office: [phone], Fax: [phone]

## 2015-11-13 NOTE — Assessment & Plan Note (Addendum)
NPSG  04/30/09   AHI 19/hr  moderate obstructive sleep apnea/hypoxia syndrome He never followed up for treatment. Has hypersomnia, snring, fatigue. Gets sleepy at work. 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  Plan : 1. Autocpap 5-15. May need bipap. May need a new sleep study (HST) if 2010 study will not work.  2. Needs good compliance and DL.  3. Needs 1 month DL > make sure he iscorrected and compliant prior to CDL.  4. Sleep hygiene.

## 2015-11-13 NOTE — Patient Instructions (Signed)
1. Will order you an autocpap 5-15 cm H2O. 2. Give [place] a call if you don't get your  machine in a couple weeks.  Return to clinic in 2 mos.

## 2015-12-10 ENCOUNTER — Other Ambulatory Visit: Payer: Self-pay

## 2015-12-10 DIAGNOSIS — I10 Essential (primary) hypertension: Secondary | ICD-10-CM

## 2015-12-10 MED ORDER — AMLODIPINE BESYLATE 10 MG PO TABS: 10.0000 mg | ORAL_TABLET | Freq: Every day | ORAL | Status: DC

## 2016-01-02 ENCOUNTER — Telehealth: Payer: Self-pay

## 2016-01-02 MED ORDER — METOPROLOL SUCCINATE ER 200 MG PO TB24
200.0000 mg | ORAL_TABLET | Freq: Every day | ORAL | Status: DC
Start: 2016-01-02 — End: 2016-04-09

## 2016-01-02 MED ORDER — VALSARTAN 320 MG PO TABS: ORAL_TABLET | ORAL | Status: DC

## 2016-01-02 NOTE — Telephone Encounter (Signed)
Metoprolol Succ ER  Last filled:  09/16/15 Amt: 90,0 Last OV:  10/01/15  Med filled

## 2016-01-02 NOTE — Telephone Encounter (Signed)
Valsartan   Last filled:  09/16/15 Amt: 90, 0 Last OV: 10/01/15  Med filled

## 2016-01-02 NOTE — Addendum Note (Signed)
Addended by: Rudene Anda on: 01/02/2016 05:31 PM   Modules accepted: Orders

## 2016-01-13 ENCOUNTER — Ambulatory Visit: Payer: Managed Care, Other (non HMO) | Admitting: Pulmonary Disease

## 2016-01-16 ENCOUNTER — Ambulatory Visit: Payer: Managed Care, Other (non HMO) | Admitting: Acute Care

## 2016-02-18 ENCOUNTER — Other Ambulatory Visit: Payer: Self-pay | Admitting: *Deleted

## 2016-02-18 MED ORDER — FUROSEMIDE 20 MG PO TABS: ORAL_TABLET | ORAL | Status: DC

## 2016-02-18 NOTE — Telephone Encounter (Signed)
Rx sent to the pharmacy by e-script.//AB/CMA 

## 2016-04-09 ENCOUNTER — Ambulatory Visit (INDEPENDENT_AMBULATORY_CARE_PROVIDER_SITE_OTHER): Payer: Managed Care, Other (non HMO) | Admitting: Family Medicine

## 2016-04-09 ENCOUNTER — Encounter: Payer: Self-pay | Admitting: Family Medicine

## 2016-04-09 VITALS — BP 146/87 | HR 70 | Temp 98.4°F | Resp 18 | Ht 75.0 in | Wt 389.0 lb

## 2016-04-09 DIAGNOSIS — E119 Type 2 diabetes mellitus without complications: Secondary | ICD-10-CM

## 2016-04-09 DIAGNOSIS — I10 Essential (primary) hypertension: Secondary | ICD-10-CM | POA: Diagnosis not present

## 2016-04-09 DIAGNOSIS — E785 Hyperlipidemia, unspecified: Secondary | ICD-10-CM

## 2016-04-09 DIAGNOSIS — J0110 Acute frontal sinusitis, unspecified: Secondary | ICD-10-CM | POA: Diagnosis not present

## 2016-04-09 LAB — COMPREHENSIVE METABOLIC PANEL
ALBUMIN: 4 g/dL (ref 3.5–5.2)
ALT: 21 U/L (ref 0–53)
AST: 22 U/L (ref 0–37)
Alkaline Phosphatase: 63 U/L (ref 39–117)
BUN: 12 mg/dL (ref 6–23)
CALCIUM: 8.8 mg/dL (ref 8.4–10.5)
CHLORIDE: 104 meq/L (ref 96–112)
CO2: 32 mEq/L (ref 19–32)
CREATININE: 0.94 mg/dL (ref 0.40–1.50)
GFR: 115.51 mL/min (ref >60.00)
Glucose, Bld: 126 mg/dL — ABNORMAL HIGH (ref 70–99)
Potassium: 3.7 mEq/L (ref 3.5–5.1)
Sodium: 139 mEq/L (ref 135–145)
Total Bilirubin: 0.5 mg/dL (ref 0.2–1.2)
Total Protein: 7.3 g/dL (ref 6.0–8.3)

## 2016-04-09 LAB — LDL CHOLESTEROL, DIRECT: LDL DIRECT: 96 mg/dL

## 2016-04-09 LAB — LIPID PANEL
CHOLESTEROL: 178 mg/dL (ref 0–200)
HDL: 29.8 mg/dL — AB (ref >39.00)
NonHDL: 147.73
TRIGLYCERIDES: 276 mg/dL — AB (ref 0.0–149.0)
Total CHOL/HDL Ratio: 6
VLDL: 55.2 mg/dL — AB (ref 0.0–40.0)

## 2016-04-09 LAB — HEMOGLOBIN A1C: HEMOGLOBIN A1C: 6.1 % (ref 4.6–6.5)

## 2016-04-09 MED ORDER — METOPROLOL SUCCINATE ER 200 MG PO TB24: 200.0000 mg | ORAL_TABLET | Freq: Every day | ORAL | 2 refills | Status: DC

## 2016-04-09 MED ORDER — AMLODIPINE BESYLATE 10 MG PO TABS: 10.0000 mg | ORAL_TABLET | Freq: Every day | ORAL | 1 refills | Status: DC

## 2016-04-09 MED ORDER — ATORVASTATIN CALCIUM 40 MG PO TABS: 40.0000 mg | ORAL_TABLET | Freq: Every day | ORAL | Status: DC

## 2016-04-09 MED ORDER — VALSARTAN 320 MG PO TABS: ORAL_TABLET | ORAL | 2 refills | Status: DC

## 2016-04-09 MED ORDER — FLUTICASONE PROPIONATE 50 MCG/ACT NA SUSP: 2.0000 | Freq: Every day | NASAL | 6 refills | Status: DC

## 2016-04-09 NOTE — Progress Notes (Signed)
Pre visit review using our clinic review tool, if applicable. No additional management support is needed unless otherwise documented below in the visit note. 

## 2016-04-09 NOTE — Progress Notes (Signed)
Patient ID: Jack Thomas, male    DOB: [birth year]  Age: 40 y.o. MRN: [id number]    Subjective:  Subjective  HPI Abdulrahman Pinheiro presents for f/u dm, bp and cholesterol.  \ HPI HYPERTENSION   Blood pressure range-not checking   Chest pain- no      Dyspnea- no Lightheadedness- no   Edema- no  Other side effects - no   Medication compliance: yes Low salt diet- yes    DIABETES    Blood Sugar ranges-low per pt   Polyuria- no New Visual problems- no  Hypoglycemic symptoms- no  Other side effects-no Medication compliance - good Last eye exam- aug 2017 Foot exam- today   HYPERLIPIDEMIA  Medication compliance- good RUQ pain- no  Muscle aches- no Other side effects-no Review of Systems  Constitutional: Negative for appetite change, diaphoresis, fatigue and unexpected weight change.  HENT: Positive for congestion, nosebleeds, postnasal drip, rhinorrhea and sneezing.   Eyes: Negative for pain, redness and visual disturbance.  Respiratory: Negative for cough, chest tightness, shortness of breath and wheezing.   Cardiovascular: Negative for chest pain, palpitations and leg swelling.  Endocrine: Negative for cold intolerance, heat intolerance, polydipsia, polyphagia and polyuria.  Genitourinary: Negative for difficulty urinating, dysuria and frequency.  Neurological: Negative for dizziness, light-headedness, numbness and headaches.    History Past Medical History:  Diagnosis Date  . Arthritis    right knee; left shoulder  . Diabetes mellitus without complication ([place])   . Hyperlipidemia   . Hypertension    takes metoprolol daily  . Sleep apnea    sleep study - recommended cpap    He has a past surgical history that includes Knee surgery (2009); Nasal sinus surgery (12/03/2011); and Nasal septoplasty w/ turbinoplasty (12/03/2011).   His family history includes Allergies in his mother; Diabetes in his father and mother; Hyperlipidemia in his father; Hypertension in his father.He  reports that he quit smoking about 5 years ago. His smoking use included Cigars. He has a 3.00 pack-year smoking history. He has never used smokeless tobacco. He reports that he does not drink alcohol or use drugs.  Current Outpatient Prescriptions on File Prior to Visit  Medication Sig Dispense Refill  . Albuterol Sulfate (PROAIR RESPICLICK) 123XX123 (90 BASE) MCG/ACT AEPB Inhale 2 puffs into the lungs 4 (four) times daily as needed. 1 each 2  . furosemide (LASIX) 20 MG tablet TAKE 1 TABLET (20 MG TOTAL) BY MOUTH DAILY. 90 tablet 1  . glucose blood (ONETOUCH VERIO) test strip Check blood sugars once daily and as needed. 100 each 12  . ONETOUCH DELICA LANCETS 99991111 MISC Check blood sugars once daily and as needed. 100 each 12   No current facility-administered medications on file prior to visit.      Objective:  Objective  Physical Exam  Constitutional: He is oriented to person, place, and time. Vital signs are normal. He appears well-developed and well-nourished. He is sleeping.  HENT:  Head: Normocephalic and atraumatic.  Nose: Rhinorrhea present. Right sinus exhibits no maxillary sinus tenderness and no frontal sinus tenderness. Left sinus exhibits no maxillary sinus tenderness and no frontal sinus tenderness.  Mouth/Throat: Oropharynx is clear and moist.  Eyes: EOM are normal. Pupils are equal, round, and reactive to light.  Neck: Normal range of motion. Neck supple. No thyromegaly present.  Cardiovascular: Normal rate and regular rhythm.   No murmur heard. Pulmonary/Chest: Effort normal and breath sounds normal. No respiratory distress. He has no wheezes. He has no rales. He exhibits  no tenderness.  Musculoskeletal: He exhibits no edema or tenderness.  Neurological: He is alert and oriented to person, place, and time.  Skin: Skin is warm and dry.  Psychiatric: He has a normal mood and affect. His behavior is normal. Judgment and thought content normal.  Nursing note and vitals  reviewed. Sensory exam of the foot is normal, tested with the monofilament. Good pulses, no lesions or ulcers, good peripheral pulses.  BP (!) 146/87 (BP Location: Right Arm, Patient Position: Sitting, Cuff Size: Large)   Pulse 70   Temp 98.4 F (36.9 C) (Oral)   Resp 18   Ht 6\' 3"  (1.905 m)   Wt (!) 389 lb (176.4 kg)   SpO2 95%   BMI 48.62 kg/m  Wt Readings from Last 3 Encounters:  04/09/16 (!) 389 lb (176.4 kg)  11/13/15 (!) 400 lb (181.4 kg)  10/01/15 (!) 390 lb 6.4 oz (177.1 kg)     Lab Results  Component Value Date   WBC 11.7 (H) 08/17/2014   HGB 14.1 08/17/2014   HCT 43.5 08/17/2014   PLT 221.0 08/17/2014   GLUCOSE 126 (H) 04/09/2016   CHOL 178 04/09/2016   TRIG 276.0 (H) 04/09/2016   HDL 29.80 (L) 04/09/2016   LDLDIRECT 96.0 04/09/2016   LDLCALC 109 (H) 08/17/2014   ALT 21 04/09/2016   AST 22 04/09/2016   NA 139 04/09/2016   K 3.7 04/09/2016   CL 104 04/09/2016   CREATININE 0.94 04/09/2016   BUN 12 04/09/2016   CO2 32 04/09/2016   TSH 1.59 08/04/2012   INR 0.93 01/16/2013   HGBA1C 6.1 04/09/2016    Dg Chest 2 View  Result Date: 08/17/2014 CLINICAL DATA:  Two weeks of cough headache and runny nose; previous history tobacco use EXAM: CHEST  2 VIEW COMPARISON:  Portable chest x-ray dated January 16, 2013 FINDINGS: A lungs are better inflated today. There is no focal infiltrate. There is no pleural effusion. The heart and pulmonary vascularity are within the limits of normal. The bony thorax is unremarkable. IMPRESSION: There is no pneumonia nor other active cardiopulmonary disease. Electronically Signed   By: David  Martinique   On: 08/17/2014 14:12     Assessment & Plan:  Plan  I have changed Mr. Kamm's atorvastatin. I am also having him maintain his glucose blood, ONETOUCH DELICA LANCETS 99991111, Albuterol Sulfate, furosemide, fluticasone, valsartan, metoprolol, and amLODipine.  Meds ordered this encounter  Medications  . fluticasone (FLONASE) 50 MCG/ACT nasal  spray    Sig: Place 2 sprays into both nostrils daily.    Dispense:  16 g    Refill:  6  . valsartan (DIOVAN) 320 MG tablet    Sig: TAKE 1 TABLET BY MOUTH DAILY-    Dispense:  90 tablet    Refill:  2  . metoprolol (TOPROL-XL) 200 MG 24 hr tablet    Sig: Take 1 tablet (200 mg total) by mouth daily.    Dispense:  90 tablet    Refill:  2  . atorvastatin (LIPITOR) 40 MG tablet    Sig: Take 1 tablet (40 mg total) by mouth daily.  Marland Kitchen amLODipine (NORVASC) 10 MG tablet    Sig: Take 1 tablet (10 mg total) by mouth daily.    Dispense:  90 tablet    Refill:  1    Problem List Items Addressed This Visit      Unprioritized   Hyperlipidemia   Relevant Medications   valsartan (DIOVAN) 320 MG tablet  metoprolol (TOPROL-XL) 200 MG 24 hr tablet   atorvastatin (LIPITOR) 40 MG tablet   amLODipine (NORVASC) 10 MG tablet   Other Relevant Orders   Lipid panel (Completed)   Comprehensive metabolic panel (Completed)   Essential hypertension    Stable con't meds      Relevant Medications   valsartan (DIOVAN) 320 MG tablet   metoprolol (TOPROL-XL) 200 MG 24 hr tablet   atorvastatin (LIPITOR) 40 MG tablet   amLODipine (NORVASC) 10 MG tablet   Other Relevant Orders   Comprehensive metabolic panel (Completed)    Other Visit Diagnoses    Diet-controlled diabetes mellitus ([place])    -  Primary   Relevant Medications   valsartan (DIOVAN) 320 MG tablet   atorvastatin (LIPITOR) 40 MG tablet   Other Relevant Orders   Hemoglobin A1c (Completed)   Comprehensive metabolic panel (Completed)   Acute frontal sinusitis, recurrence not specified       Relevant Medications   fluticasone (FLONASE) 50 MCG/ACT nasal spray      Follow-up: Return in about 6 months (around 10/07/2016) for hypertension, hyperlipidemia, diabetes II, annual exam, fasting.  Ann Held, DO

## 2016-04-09 NOTE — Patient Instructions (Signed)

## 2016-04-12 NOTE — Assessment & Plan Note (Signed)
Stable con't meds 

## 2016-04-12 NOTE — Assessment & Plan Note (Addendum)
Diet controlled. Check labs.  

## 2016-05-01 ENCOUNTER — Ambulatory Visit (INDEPENDENT_AMBULATORY_CARE_PROVIDER_SITE_OTHER): Payer: Self-pay | Admitting: Family Medicine

## 2016-05-01 VITALS — BP 136/88 | HR 94 | Temp 98.7°F | Resp 16 | Ht 71.0 in | Wt 387.0 lb

## 2016-05-01 DIAGNOSIS — Z024 Encounter for examination for driving license: Secondary | ICD-10-CM

## 2016-05-01 NOTE — Patient Instructions (Signed)
     IF you received an x-ray today, you will receive an invoice from [facility]. Please contact [facility] at [phone] with questions or concerns regarding your invoice.   IF you received labwork today, you will receive an invoice from [facility]. Please contact [facility] at [phone] with questions or concerns regarding your invoice.   Our billing staff will not be able to assist you with questions regarding bills from these companies.  You will be contacted with the lab results as soon as they are available. The fastest way to get your results is to activate your My Chart account. Instructions are located on the last page of this paperwork. If you have not heard from us regarding the results in 2 weeks, please contact this office.      

## 2016-05-01 NOTE — Progress Notes (Signed)
Commercial Driver Medical Examination   Jack Thomas is a 40 y.o. male who presents today for a commercial driver fitness determination physical exam. The patient reports no problems. Pt has type 2 diabetes and his records are available for my review. He follows closely with his PCP Dr. Etter Sjogren and a1c was 6.1 3 wks ago not on meds. BP well controlled. He does have OSA but just got cpap, hasn't started using it yet.  The following portions of the patient's history were reviewed and updated as appropriate: allergies, past family history, past medical history, past social history, past surgical history and problem list. Review of Systems Pertinent items are noted in HPI.   Objective:    Vision:  Visual Acuity Screening   Right eye Left eye Both eyes  Without correction:     With correction: 20/20 20/20 20/20   Comments: Colors: 6/6 Periph:  R 85 degrees  L 85 degrees     Applicant can recognize and distinguish among traffic control signals and devices showing standard red, green, and amber colors.     Monocular Vision?: No   Hearing: Hearing Screening Comments: whisper test: L ear 10 ft  R ear 10 ft     BP 136/88 (BP Location: Right Arm, Patient Position: Sitting, Cuff Size: Large)   Pulse 94   Temp 98.7 F (37.1 C) (Oral)   Resp 16   Ht 5\' 11"  (1.803 m)   Wt (!) 387 lb (175.5 kg)   SpO2 95%   BMI 53.98 kg/m   General Appearance:    Alert, cooperative, no distress, appears stated age  Head:    Normocephalic, without obvious abnormality, atraumatic  Eyes:    PERRL, conjunctiva/corneas clear, EOM's intact, fundi    benign, both eyes       Ears:    Normal TM's and external ear canals, both ears  Nose:   Nares normal, septum midline, mucosa normal, no drainage    or sinus tenderness  Throat:   Lips, mucosa, and tongue normal; teeth and gums normal  Neck:   Supple, symmetrical, trachea midline, no adenopathy;       thyroid:  No enlargement/tenderness/nodules; no carotid  bruit or JVD  Back:     Symmetric, no curvature, ROM normal, no CVA tenderness  Lungs:     Clear to auscultation bilaterally, respirations unlabored  Chest wall:    No tenderness or deformity  Heart:    Regular rate and rhythm, S1 and S2 normal, no murmur, rub   or gallop  Abdomen:     Soft, non-tender, bowel sounds active all four quadrants,    no masses, no organomegaly  Genitalia:    No inguinal hernias.     Extremities:   Extremities normal, atraumatic, no cyanosis or edema  Pulses:   2+ and symmetric all extremities  Skin:   Skin color, texture, turgor normal, no rashes or lesions  Lymph nodes:   Cervical, supraclavicular, and axillary nodes normal  Neurologic:   CNII-XII intact. Normal strength, sensation and reflexes      throughout     Labs: SG 1.025, neg prot, neg blood, neg gluc  Assessment:    Healthy male exam.  Meets standards, but periodic monitoring required due to DMII, HTN, OSA.  Driver qualified only for 3 months.    Plan:    Medical examiners certificate completed and printed. Return as needed.  Pt is just starting on cpap - has not actually started using it yet but will. Therefore  gave 3 month card, during that time he needs to fax or bring in a 30d compliance report confirming >= 4hrs/night >70% of the time at which point I will rewrite a 1 yr card to expire on 05/01/2017.

## 2016-06-12 ENCOUNTER — Encounter (HOSPITAL_BASED_OUTPATIENT_CLINIC_OR_DEPARTMENT_OTHER): Payer: Self-pay | Admitting: Emergency Medicine

## 2016-06-12 ENCOUNTER — Emergency Department (HOSPITAL_BASED_OUTPATIENT_CLINIC_OR_DEPARTMENT_OTHER): Payer: Managed Care, Other (non HMO)

## 2016-06-12 ENCOUNTER — Emergency Department (HOSPITAL_BASED_OUTPATIENT_CLINIC_OR_DEPARTMENT_OTHER)
Admission: EM | Admit: 2016-06-12 | Discharge: 2016-06-12 | Disposition: A | Discharge status: Home / Self Care | Payer: Managed Care, Other (non HMO) | Attending: Emergency Medicine | Admitting: Emergency Medicine

## 2016-06-12 DIAGNOSIS — R52 Pain, unspecified: Secondary | ICD-10-CM | POA: Diagnosis present

## 2016-06-12 DIAGNOSIS — Z87891 Personal history of nicotine dependence: Secondary | ICD-10-CM | POA: Diagnosis not present

## 2016-06-12 DIAGNOSIS — E119 Type 2 diabetes mellitus without complications: Secondary | ICD-10-CM | POA: Insufficient documentation

## 2016-06-12 DIAGNOSIS — R05 Cough: Secondary | ICD-10-CM | POA: Insufficient documentation

## 2016-06-12 DIAGNOSIS — J3489 Other specified disorders of nose and nasal sinuses: Secondary | ICD-10-CM | POA: Insufficient documentation

## 2016-06-12 DIAGNOSIS — R0789 Other chest pain: Secondary | ICD-10-CM | POA: Insufficient documentation

## 2016-06-12 DIAGNOSIS — I10 Essential (primary) hypertension: Secondary | ICD-10-CM | POA: Insufficient documentation

## 2016-06-12 DIAGNOSIS — R0602 Shortness of breath: Secondary | ICD-10-CM | POA: Insufficient documentation

## 2016-06-12 DIAGNOSIS — R630 Anorexia: Secondary | ICD-10-CM | POA: Insufficient documentation

## 2016-06-12 DIAGNOSIS — R531 Weakness: Secondary | ICD-10-CM | POA: Diagnosis not present

## 2016-06-12 DIAGNOSIS — R6889 Other general symptoms and signs: Secondary | ICD-10-CM

## 2016-06-12 DIAGNOSIS — R509 Fever, unspecified: Secondary | ICD-10-CM | POA: Diagnosis not present

## 2016-06-12 DIAGNOSIS — R197 Diarrhea, unspecified: Secondary | ICD-10-CM | POA: Diagnosis not present

## 2016-06-12 DIAGNOSIS — Z79899 Other long term (current) drug therapy: Secondary | ICD-10-CM | POA: Insufficient documentation
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
## 2016-06-12 NOTE — ED Notes (Signed)
ED Provider at bedside. 

## 2016-06-12 NOTE — ED Triage Notes (Signed)
Pt having nasal congestion, green nasal drainage, non productive cough,  Generalized aches and pains since yesterday.  No known fever, but cold and hot spells.

## 2016-08-31 ENCOUNTER — Other Ambulatory Visit: Payer: Self-pay | Admitting: Family Medicine

## 2016-08-31 DIAGNOSIS — I10 Essential (primary) hypertension: Secondary | ICD-10-CM

## 2016-09-15 ENCOUNTER — Other Ambulatory Visit: Payer: Self-pay | Admitting: Family Medicine

## 2016-09-15 DIAGNOSIS — E785 Hyperlipidemia, unspecified: Secondary | ICD-10-CM

## 2016-09-15 MED ORDER — ATORVASTATIN CALCIUM 40 MG PO TABS: 40.0000 mg | ORAL_TABLET | Freq: Every day | ORAL | 0 refills | Status: DC

## 2016-10-10 ENCOUNTER — Ambulatory Visit (INDEPENDENT_AMBULATORY_CARE_PROVIDER_SITE_OTHER): Payer: Self-pay | Admitting: Family Medicine

## 2016-10-10 VITALS — BP 132/80 | HR 69 | Temp 98.2°F | Resp 17 | Ht 72.5 in | Wt 378.0 lb

## 2016-10-10 DIAGNOSIS — Z024 Encounter for examination for driving license: Secondary | ICD-10-CM

## 2016-10-10 NOTE — Patient Instructions (Signed)
     IF you received an x-ray today, you will receive an invoice from [facility]. Please contact [facility] at [phone] with questions or concerns regarding your invoice.   IF you received labwork today, you will receive an invoice from [facility]. Please contact [facility] at [phone] with questions or concerns regarding your invoice.   Our billing staff will not be able to assist you with questions regarding bills from these companies.  You will be contacted with the lab results as soon as they are available. The fastest way to get your results is to activate your My Chart account. Instructions are located on the last page of this paperwork. If you have not heard from us regarding the results in 2 weeks, please contact this office.     

## 2016-10-10 NOTE — Progress Notes (Signed)
Commercial Driver Medical Examination   Jack Thomas is a 41 y.o. male who presents today for a commercial driver fitness determination physical exam. The patient reports problems - low back pain from working 2 jobs - second at a [employer].. The following portions of the patient's history were reviewed and updated as appropriate: allergies, current medications, past family history, past medical history, past social history, past surgical history and problem list. Review of Systems Pertinent items noted in HPI and remainder of comprehensive ROS otherwise negative.   Objective:    Vision:   Visual Acuity Screening   Right eye Left eye Both eyes  Without correction:     With correction: 20/15 20/15 20/15   Hearing Screening Comments: Peripheral Vision: Right eye 85 degrees. Left eye 85 degrees. The patient can distinguish the colors red, amber and green. The patient was able to hear a forced whisper from L=10 R=10  feet.   Applicant can recognize and distinguish among traffic control signals and devices showing standard red, green, and amber colors.  Applicant meets visual acuity requirement only when wearing corrective lenses.  Monocular Vision?: No   Hearing:   BP 132/80   Pulse 69   Temp 98.2 F (36.8 C) (Oral)   Resp 17   Ht 6' 0.5" (1.842 m)   Wt (!) 378 lb (171.5 kg)   SpO2 97%   BMI 50.56 kg/m   General Appearance:    Alert, cooperative, no distress, appears stated age  Head:    Normocephalic, without obvious abnormality, atraumatic  Eyes:    PERRL, conjunctiva/corneas clear, EOM's intact, fundi    benign, both eyes       Ears:    Normal TM's and external ear canals, both ears  Nose:   Nares normal, septum midline, mucosa normal, no drainage    or sinus tenderness  Throat:   Lips, mucosa, and tongue normal; teeth and gums normal  Neck:   Supple, symmetrical, trachea midline, no adenopathy;       thyroid:  No enlargement/tenderness/nodules; no  carotid   bruit or JVD  Back:     Symmetric, no curvature, ROM normal, no CVA tenderness  Lungs:     Clear to auscultation bilaterally, respirations unlabored  Chest wall:    No tenderness or deformity  Heart:    Regular rate and rhythm, S1 and S2 normal, no murmur, rub   or gallop           Extremities:   Extremities normal, atraumatic, no cyanosis or edema  Pulses:   2+ and symmetric all extremities  Skin:   Skin color, texture, turgor normal, no rashes or lesions  Lymph nodes:   Cervical, supraclavicular, and axillary nodes normal  Neurologic:   CNII-XII intact. Normal strength, sensation and reflexes      throughout    Labs: spgr:1.015 Glu:neg Blood:neg Pro:neg   Assessment:    Healthy male exam.  Meets standards, but periodic monitoring required due to HTN, OSA, DMII.  Driver qualified only for 1 year.    Plan:   OSA compliance report for the past mo viewed on phone - pt using nightly DMII: pt working on diet/exercise with 10 lbs weight loss in past 6 mos. Will f/u w/ PCP soon.  Medical examiners certificate completed and printed. Return as needed.

## 2016-10-27 ENCOUNTER — Emergency Department (HOSPITAL_BASED_OUTPATIENT_CLINIC_OR_DEPARTMENT_OTHER)
Admission: EM | Admit: 2016-10-27 | Discharge: 2016-10-27 | Disposition: A | Discharge status: Home / Self Care | Payer: Managed Care, Other (non HMO) | Attending: Emergency Medicine | Admitting: Emergency Medicine

## 2016-10-27 ENCOUNTER — Encounter (HOSPITAL_BASED_OUTPATIENT_CLINIC_OR_DEPARTMENT_OTHER): Payer: Self-pay

## 2016-10-27 ENCOUNTER — Emergency Department (HOSPITAL_BASED_OUTPATIENT_CLINIC_OR_DEPARTMENT_OTHER): Payer: Managed Care, Other (non HMO)

## 2016-10-27 DIAGNOSIS — Z7982 Long term (current) use of aspirin: Secondary | ICD-10-CM | POA: Diagnosis not present

## 2016-10-27 DIAGNOSIS — E119 Type 2 diabetes mellitus without complications: Secondary | ICD-10-CM | POA: Diagnosis not present

## 2016-10-27 DIAGNOSIS — H6691 Otitis media, unspecified, right ear: Secondary | ICD-10-CM | POA: Diagnosis not present

## 2016-10-27 DIAGNOSIS — J209 Acute bronchitis, unspecified: Secondary | ICD-10-CM | POA: Insufficient documentation

## 2016-10-27 DIAGNOSIS — H6690 Otitis media, unspecified, unspecified ear: Secondary | ICD-10-CM

## 2016-10-27 DIAGNOSIS — Z87891 Personal history of nicotine dependence: Secondary | ICD-10-CM | POA: Diagnosis not present

## 2016-10-27 DIAGNOSIS — I10 Essential (primary) hypertension: Secondary | ICD-10-CM | POA: Insufficient documentation

## 2016-10-27 DIAGNOSIS — R0981 Nasal congestion: Secondary | ICD-10-CM | POA: Diagnosis present

## 2016-10-27 LAB — RAPID STREP SCREEN (MED CTR MEBANE ONLY): Streptococcus, Group A Screen (Direct): NEGATIVE

## 2016-10-27 MED ORDER — LORATADINE 10 MG PO TABS: 10.0000 mg | ORAL_TABLET | Freq: Every day | ORAL | 0 refills | Status: DC

## 2016-10-27 MED ORDER — PREDNISONE 20 MG PO TABS: ORAL_TABLET | ORAL | 0 refills | Status: DC

## 2016-10-27 MED ORDER — ALBUTEROL SULFATE HFA 108 (90 BASE) MCG/ACT IN AERS
1.0000 | INHALATION_SPRAY | RESPIRATORY_TRACT | Status: DC | PRN
  Administered 2016-10-27: 2 via RESPIRATORY_TRACT
  Filled 2016-10-27: qty 6.7

## 2016-10-27 MED ORDER — LEVOFLOXACIN 750 MG PO TABS: 750.0000 mg | ORAL_TABLET | Freq: Every day | ORAL | 0 refills | Status: DC

## 2016-10-27 MED ORDER — ALBUTEROL SULFATE (2.5 MG/3ML) 0.083% IN NEBU
5.0000 mg | INHALATION_SOLUTION | Freq: Once | RESPIRATORY_TRACT | Status: AC
  Administered 2016-10-27: 5 mg via RESPIRATORY_TRACT
  Filled 2016-10-27: qty 6

## 2016-10-27 MED ORDER — LORATADINE 10 MG PO TABS
10.0000 mg | ORAL_TABLET | Freq: Once | ORAL | Status: AC
  Administered 2016-10-27: 10 mg via ORAL
  Filled 2016-10-27: qty 1

## 2016-10-27 MED ORDER — PREDNISONE 50 MG PO TABS
60.0000 mg | ORAL_TABLET | Freq: Once | ORAL | Status: AC
  Administered 2016-10-27: 60 mg via ORAL
  Filled 2016-10-27: qty 1

## 2016-10-27 MED FILL — LORATADINE 10 MG TABLET: 10 | 100 days supply | Qty: 100 | Fill #0

## 2016-10-27 MED FILL — levoFLOXacin 750 MG TABS: 750 | 7 days supply | Qty: 7 | Fill #0

## 2016-10-27 MED FILL — predniSONE 20 MG TABS: 20 | 5 days supply | Qty: 11 | Fill #0

## 2016-10-27 NOTE — ED Notes (Signed)
Pt ambulated to XRay at this time

## 2016-10-27 NOTE — ED Triage Notes (Signed)
Pt reports nasal drainage and congestion since Friday. Reports intermittent chills as well. Sts right ear pain at this time.

## 2016-10-27 NOTE — ED Notes (Signed)
ED Provider at bedside. 
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
## 2016-10-28 ENCOUNTER — Ambulatory Visit: Payer: Managed Care, Other (non HMO) | Admitting: Medical

## 2016-10-29 ENCOUNTER — Other Ambulatory Visit: Payer: Self-pay | Admitting: *Deleted

## 2016-10-29 MED ORDER — FUROSEMIDE 20 MG PO TABS: 20.0000 mg | ORAL_TABLET | Freq: Every day | ORAL | 0 refills | Status: DC

## 2016-10-30 LAB — CULTURE, GROUP A STREP (THRC)

## 2016-12-04 ENCOUNTER — Encounter: Payer: Self-pay | Admitting: Family Medicine

## 2016-12-04 ENCOUNTER — Ambulatory Visit (INDEPENDENT_AMBULATORY_CARE_PROVIDER_SITE_OTHER): Payer: Managed Care, Other (non HMO) | Admitting: Family Medicine

## 2016-12-04 VITALS — BP 148/128 | HR 72 | Temp 98.0°F | Wt 387.4 lb

## 2016-12-04 DIAGNOSIS — E785 Hyperlipidemia, unspecified: Secondary | ICD-10-CM

## 2016-12-04 DIAGNOSIS — R600 Localized edema: Secondary | ICD-10-CM

## 2016-12-04 DIAGNOSIS — E1151 Type 2 diabetes mellitus with diabetic peripheral angiopathy without gangrene: Secondary | ICD-10-CM

## 2016-12-04 DIAGNOSIS — I10 Essential (primary) hypertension: Secondary | ICD-10-CM

## 2016-12-04 DIAGNOSIS — K529 Noninfective gastroenteritis and colitis, unspecified: Secondary | ICD-10-CM

## 2016-12-04 DIAGNOSIS — IMO0002 Reserved for concepts with insufficient information to code with codable children: Secondary | ICD-10-CM

## 2016-12-04 DIAGNOSIS — E1165 Type 2 diabetes mellitus with hyperglycemia: Secondary | ICD-10-CM | POA: Diagnosis not present

## 2016-12-04 DIAGNOSIS — E1169 Type 2 diabetes mellitus with other specified complication: Secondary | ICD-10-CM | POA: Diagnosis not present

## 2016-12-04 LAB — CBC
HCT: 42 % (ref 38.5–50.0)
HEMOGLOBIN: 13.8 g/dL (ref 13.2–17.1)
MCH: 31.5 pg (ref 27.0–33.0)
MCHC: 32.9 g/dL (ref 32.0–36.0)
MCV: 95.9 fL (ref 80.0–100.0)
MPV: 10.6 fL (ref 7.5–12.5)
PLATELETS: 252 10*3/uL (ref 140–400)
RBC: 4.38 MIL/uL (ref 4.20–5.80)
RDW: 12.8 % (ref 11.0–15.0)
WBC: 12.3 10*3/uL — ABNORMAL HIGH (ref 3.8–10.8)

## 2016-12-04 MED ORDER — LOSARTAN POTASSIUM 50 MG PO TABS: 50.0000 mg | ORAL_TABLET | Freq: Every day | ORAL | 2 refills | Status: DC

## 2016-12-04 MED ORDER — FUROSEMIDE 20 MG PO TABS: 20.0000 mg | ORAL_TABLET | Freq: Every day | ORAL | 3 refills | Status: DC

## 2016-12-04 MED ORDER — ATORVASTATIN CALCIUM 40 MG PO TABS: 40.0000 mg | ORAL_TABLET | Freq: Every day | ORAL | 0 refills | Status: DC

## 2016-12-04 MED ORDER — AMLODIPINE BESYLATE 10 MG PO TABS: 10.0000 mg | ORAL_TABLET | Freq: Every day | ORAL | 0 refills | Status: DC

## 2016-12-04 MED ORDER — HYOSCYAMINE SULFATE 0.125 MG SL SUBL: 0.1250 mg | SUBLINGUAL_TABLET | SUBLINGUAL | Status: DC | PRN

## 2016-12-04 MED ORDER — METOPROLOL SUCCINATE ER 200 MG PO TB24: 200.0000 mg | ORAL_TABLET | Freq: Every day | ORAL | 2 refills | Status: DC

## 2016-12-04 NOTE — Assessment & Plan Note (Signed)
Tolerating statin, encouraged heart healthy diet, avoid trans fats, minimize simple carbs and saturated fats. Increase exercise as tolerated 

## 2016-12-04 NOTE — Assessment & Plan Note (Signed)
Poorly controlled will alter medications, encouraged DASH diet, minimize caffeine and obtain adequate sleep. Report concerning symptoms and follow up as directed and as needed 

## 2016-12-04 NOTE — Progress Notes (Signed)
Pre visit review using our clinic review tool, if applicable. No additional management support is needed unless otherwise documented below in the visit note. 

## 2016-12-04 NOTE — Assessment & Plan Note (Signed)
Check labs 

## 2016-12-04 NOTE — Progress Notes (Signed)
Patient ID: Jack Thomas, male   DOB: [birth year], 41 y.o.   MRN: [id number]   Subjective:  I acted as a Education administrator for [employer], DO. Jack Thomas, [place]   Patient ID: Jack Thomas, male    DOB: 24-May-1976, 41 y.o.   MRN: [id number]  Chief Complaint  Patient presents with  . Medication F/U    HPI Patient is in today for a medication follow up. Patient states that he has ongoing right knee pain. Patient has a Hx of HTN, Type II Diabetes OSA, headaches low back pain, Hx of heamturia, hyperlipidemia, edema, severe obesity. Patient has no additional acute concerns noted at this time.   HYPERTENSION   Blood pressure range-not checking   Chest pain- no      Dyspnea- no Lightheadedness- no   Edema- some  Other side effects - no   Medication compliance: good Low salt diet- yes    DIABETES    Blood Sugar ranges-85-100  Polyuria- no New Visual problems- no  Hypoglycemic symptoms- no  Other side effects-no Medication compliance - good Last eye exam- April per pt Foot exam- today   HYPERLIPIDEMIA  Medication compliance- good RUQ pain- no  Muscle aches- no Other side effects-no   Patient Care Team: Ann Held, DO as PCP - General (Family Medicine)   Past Medical History:  Diagnosis Date  . Arthritis    right knee; left shoulder  . Diabetes mellitus without complication ([place])   . Hyperlipidemia   . Hypertension    takes metoprolol daily  . Sleep apnea    sleep study - recommended cpap    Past Surgical History:  Procedure Laterality Date  . KNEE SURGERY  2009   right  . NASAL SEPTOPLASTY W/ TURBINOPLASTY  12/03/2011   Procedure: NASAL SEPTOPLASTY WITH TURBINATE REDUCTION;  Surgeon: Izora Gala, MD;  Location: [facility];  Service: ENT;  Laterality: Bilateral;  . NASAL SINUS SURGERY  12/03/2011   Procedure: ENDOSCOPIC SINUS SURGERY;  Surgeon: Izora Gala, MD;  Location: [facility] OR;  Service: ENT;  Laterality: Bilateral;    Family History  Problem Relation Age of Onset  .  Diabetes Mother   . Allergies Mother   . Hyperlipidemia Father   . Hypertension Father   . Diabetes Father   . Anesthesia problems Neg Hx   . Hypotension Neg Hx   . Malignant hyperthermia Neg Hx   . Pseudochol deficiency Neg Hx     Social History   Social History  . Marital status: Single    Spouse name: N/A  . Number of children: 0  . Years of education: N/A   Occupational History  . Forklift driver Jack Thomas   Social History Main Topics  . Smoking status: Former Smoker    Packs/day: 0.50    Years: 6.00    Types: Cigars    Quit date: 11/02/2010  . Smokeless tobacco: Never Used  . Alcohol use No  . Drug use: No  . Sexual activity: Yes   Other Topics Concern  . Not on file   Social History Narrative  . No narrative on file    Outpatient Medications Prior to Visit  Medication Sig Dispense Refill  . fluticasone (FLONASE) 50 MCG/ACT nasal spray Place 2 sprays into both nostrils daily. 16 g 6  . glucose blood (ONETOUCH VERIO) test strip Check blood sugars once daily and as needed. 100 each 12  . loratadine (CLARITIN) 10 MG tablet Take 1 tablet (10 mg total) by  mouth daily. 30 tablet 0  . ONETOUCH DELICA LANCETS 96V MISC Check blood sugars once daily and as needed. 100 each 12  . amLODipine (NORVASC) 10 MG tablet TAKE ONE TABLET BY MOUTH DAILY 90 tablet 0  . atorvastatin (LIPITOR) 40 MG tablet Take 1 tablet (40 mg total) by mouth daily. 90 tablet 0  . furosemide (LASIX) 20 MG tablet Take 1 tablet (20 mg total) by mouth daily. Needs office visit before any more fills 30 tablet 0  . metoprolol (TOPROL-XL) 200 MG 24 hr tablet Take 1 tablet (200 mg total) by mouth daily. 90 tablet 2  . levofloxacin (LEVAQUIN) 750 MG tablet Take 1 tablet (750 mg total) by mouth daily. X 7 days (Patient not taking: Reported on 12/04/2016) 7 tablet 0  . predniSONE (DELTASONE) 20 MG tablet 3 tabs po day one, then 2 po daily x 4 days (Patient not taking: Reported on 12/04/2016) 11 tablet 0   No  facility-administered medications prior to visit.     No Known Allergies  Review of Systems  Constitutional: Negative for fever and malaise/fatigue.  HENT: Negative for congestion.   Eyes: Negative for blurred vision.  Respiratory: Negative for cough and shortness of breath.   Cardiovascular: Negative for chest pain, palpitations and leg swelling.  Gastrointestinal: Negative for vomiting.  Musculoskeletal: Positive for joint pain. Negative for back pain.       R knee.  Skin: Negative for rash.  Neurological: Negative for loss of consciousness and headaches.       Objective:    Physical Exam  Constitutional: He is oriented to person, place, and time. He appears well-developed and well-nourished. No distress.  HENT:  Head: Normocephalic and atraumatic.  Eyes: Conjunctivae are normal.  Neck: Normal range of motion. No thyromegaly present.  Cardiovascular: Normal rate and regular rhythm.   Pulmonary/Chest: Effort normal and breath sounds normal. He has no wheezes.  Abdominal: Soft. Bowel sounds are normal. There is no tenderness.  Musculoskeletal: He exhibits no edema or deformity.  Neurological: He is alert and oriented to person, place, and time.  Skin: Skin is warm and dry. He is not diaphoretic.  Psychiatric: He has a normal mood and affect.  Sensory exam of the foot is normal, tested with the monofilament. Good pulses, no lesions or ulcers, good peripheral pulses.   BP (!) 148/128 (BP Location: Right Wrist, Patient Position: Sitting, Cuff Size: Normal)   Pulse 72   Temp 98 F (36.7 C) (Oral)   Wt (!) 387 lb 6.4 oz (175.7 kg)   SpO2 98% Comment: RA  BMI 51.11 kg/m  Wt Readings from Last 3 Encounters:  12/04/16 (!) 387 lb 6.4 oz (175.7 kg)  10/27/16 (!) 374 lb (169.6 kg)  10/10/16 (!) 378 lb (171.5 kg)   BP Readings from Last 3 Encounters:  12/04/16 (!) 148/128  10/27/16 (!) 158/101  10/10/16 132/80     Immunization History  Administered Date(s) Administered    . Td 10/16/2008  . Tdap 01/16/2013    Health Maintenance  Topic Date Due  . URINE MICROALBUMIN  04/09/1988  . FOOT EXAM  09/30/2016  . HEMOGLOBIN A1C  10/07/2016  . PNEUMOCOCCAL POLYSACCHARIDE VACCINE (1) 04/09/2017 (Originally 04/09/1980)  . INFLUENZA VACCINE  03/03/2017  . OPHTHALMOLOGY EXAM  03/23/2017  . TETANUS/TDAP  01/17/2023  . HIV Screening  Completed    Lab Results  Component Value Date   WBC 11.7 (H) 08/17/2014   HGB 14.1 08/17/2014   HCT 43.5 08/17/2014  PLT 221.0 08/17/2014   GLUCOSE 126 (H) 04/09/2016   CHOL 178 04/09/2016   TRIG 276.0 (H) 04/09/2016   HDL 29.80 (L) 04/09/2016   LDLDIRECT 96.0 04/09/2016   LDLCALC 109 (H) 08/17/2014   ALT 21 04/09/2016   AST 22 04/09/2016   NA 139 04/09/2016   K 3.7 04/09/2016   CL 104 04/09/2016   CREATININE 0.94 04/09/2016   BUN 12 04/09/2016   CO2 32 04/09/2016   TSH 1.59 08/04/2012   INR 0.93 01/16/2013   HGBA1C 6.1 04/09/2016    Lab Results  Component Value Date   TSH 1.59 08/04/2012   Lab Results  Component Value Date   WBC 11.7 (H) 08/17/2014   HGB 14.1 08/17/2014   HCT 43.5 08/17/2014   MCV 95.5 08/17/2014   PLT 221.0 08/17/2014   Lab Results  Component Value Date   NA 139 04/09/2016   K 3.7 04/09/2016   CO2 32 04/09/2016   GLUCOSE 126 (H) 04/09/2016   BUN 12 04/09/2016   CREATININE 0.94 04/09/2016   BILITOT 0.5 04/09/2016   ALKPHOS 63 04/09/2016   AST 22 04/09/2016   ALT 21 04/09/2016   PROT 7.3 04/09/2016   ALBUMIN 4.0 04/09/2016   CALCIUM 8.8 04/09/2016   GFR 115.51 04/09/2016   Lab Results  Component Value Date   CHOL 178 04/09/2016   Lab Results  Component Value Date   HDL 29.80 (L) 04/09/2016   Lab Results  Component Value Date   LDLCALC 109 (H) 08/17/2014   Lab Results  Component Value Date   TRIG 276.0 (H) 04/09/2016   Lab Results  Component Value Date   CHOLHDL 6 04/09/2016   Lab Results  Component Value Date   HGBA1C 6.1 04/09/2016         Assessment &  Plan:   Problem List Items Addressed This Visit      Unprioritized   Diabetes mellitus type II, uncontrolled ([place])    Check labs       Relevant Medications   atorvastatin (LIPITOR) 40 MG tablet   losartan (COZAAR) 50 MG tablet   Essential hypertension    Poorly controlled will alter medications, encouraged DASH diet, minimize caffeine and obtain adequate sleep. Report concerning symptoms and follow up as directed and as needed      Relevant Medications   furosemide (LASIX) 20 MG tablet   atorvastatin (LIPITOR) 40 MG tablet   metoprolol (TOPROL-XL) 200 MG 24 hr tablet   amLODipine (NORVASC) 10 MG tablet   losartan (COZAAR) 50 MG tablet   Other Relevant Orders   CBC   Comprehensive metabolic panel   Hyperlipidemia    Tolerating statin, encouraged heart healthy diet, avoid trans fats, minimize simple carbs and saturated fats. Increase exercise as tolerated      Relevant Medications   furosemide (LASIX) 20 MG tablet   atorvastatin (LIPITOR) 40 MG tablet   metoprolol (TOPROL-XL) 200 MG 24 hr tablet   amLODipine (NORVASC) 10 MG tablet   losartan (COZAAR) 50 MG tablet    Other Visit Diagnoses    DM (diabetes mellitus) type II uncontrolled, periph vascular disorder (HCC)    -  Primary   Relevant Medications   furosemide (LASIX) 20 MG tablet   atorvastatin (LIPITOR) 40 MG tablet   metoprolol (TOPROL-XL) 200 MG 24 hr tablet   amLODipine (NORVASC) 10 MG tablet   losartan (COZAAR) 50 MG tablet   Other Relevant Orders   Hemoglobin A1c   Comprehensive metabolic panel  Microalbumin / creatinine urine ratio   Hyperlipidemia LDL goal <70       Relevant Medications   furosemide (LASIX) 20 MG tablet   atorvastatin (LIPITOR) 40 MG tablet   metoprolol (TOPROL-XL) 200 MG 24 hr tablet   amLODipine (NORVASC) 10 MG tablet   losartan (COZAAR) 50 MG tablet   Other Relevant Orders   Comprehensive metabolic panel   Lipid panel   Chronic diarrhea       Relevant Medications    hyoscyamine (LEVSIN SL) SL tablet 0.125 mg   Other Relevant Orders   Ambulatory referral to Gastroenterology   Comprehensive metabolic panel   Lower extremity edema       Relevant Medications   furosemide (LASIX) 20 MG tablet      I have discontinued Mr. Janeway's predniSONE and levofloxacin. I have also changed his furosemide and amLODipine. Additionally, I am having him start on losartan. Lastly, I am having him maintain his glucose blood, ONETOUCH DELICA LANCETS 50N, fluticasone, loratadine, atorvastatin, and metoprolol. We will continue to administer hyoscyamine.  Meds ordered this encounter  Medications  . furosemide (LASIX) 20 MG tablet    Sig: Take 1 tablet (20 mg total) by mouth daily.    Dispense:  90 tablet    Refill:  3  . hyoscyamine (LEVSIN SL) SL tablet 0.125 mg  . atorvastatin (LIPITOR) 40 MG tablet    Sig: Take 1 tablet (40 mg total) by mouth daily.    Dispense:  90 tablet    Refill:  0  . metoprolol (TOPROL-XL) 200 MG 24 hr tablet    Sig: Take 1 tablet (200 mg total) by mouth daily.    Dispense:  90 tablet    Refill:  2  . amLODipine (NORVASC) 10 MG tablet    Sig: Take 1 tablet (10 mg total) by mouth daily.    Dispense:  90 tablet    Refill:  0  . losartan (COZAAR) 50 MG tablet    Sig: Take 1 tablet (50 mg total) by mouth daily.    Dispense:  30 tablet    Refill:  2     Ann Held, DO

## 2016-12-04 NOTE — Patient Instructions (Signed)
Carbohydrate Counting for Diabetes Mellitus, Adult Carbohydrate counting is a method for keeping track of how many carbohydrates you eat. Eating carbohydrates naturally increases the amount of sugar (glucose) in the blood. Counting how many carbohydrates you eat helps keep your blood glucose within normal limits, which helps you manage your diabetes (diabetes mellitus). It is important to know how many carbohydrates you can safely have in each meal. This is different for every person. A diet and nutrition specialist (registered dietitian) can help you make a meal plan and calculate how many carbohydrates you should have at each meal and snack. Carbohydrates are found in the following foods:  Grains, such as breads and cereals.  Dried beans and soy products.  Starchy vegetables, such as potatoes, peas, and corn.  Fruit and fruit juices.  Milk and yogurt.  Sweets and snack foods, such as cake, cookies, candy, chips, and soft drinks. How do I count carbohydrates? There are two ways to count carbohydrates in food. You can use either of the methods or a combination of both. Reading "Nutrition Facts" on packaged food  The "Nutrition Facts" list is included on the labels of almost all packaged foods and beverages in the [place] It includes:  The serving size.  Information about nutrients in each serving, including the grams (g) of carbohydrate per serving. To use the "Nutrition Facts":  Decide how many servings you will have.  Multiply the number of servings by the number of carbohydrates per serving.  The resulting number is the total amount of carbohydrates that you will be having. Learning standard serving sizes of other foods  When you eat foods containing carbohydrates that are not packaged or do not include "Nutrition Facts" on the label, you need to measure the servings in order to count the amount of carbohydrates:  Measure the foods that you will eat with a food scale or measuring  cup, if needed.  Decide how many standard-size servings you will eat.  Multiply the number of servings by 15. Most carbohydrate-rich foods have about 15 g of carbohydrates per serving.  For example, if you eat 8 oz (170 g) of strawberries, you will have eaten 2 servings and 30 g of carbohydrates (2 servings x 15 g = 30 g).  For foods that have more than one food mixed, such as soups and casseroles, you must count the carbohydrates in each food that is included. The following list contains standard serving sizes of common carbohydrate-rich foods. Each of these servings has about 15 g of carbohydrates:   hamburger bun or  English muffin.   oz (15 mL) syrup.   oz (14 g) jelly.  1 slice of bread.  1 six-inch tortilla.  3 oz (85 g) cooked rice or pasta.  4 oz (113 g) cooked dried beans.  4 oz (113 g) starchy vegetable, such as peas, corn, or potatoes.  4 oz (113 g) hot cereal.  4 oz (113 g) mashed potatoes or  of a large baked potato.  4 oz (113 g) canned or frozen fruit.  4 oz (120 mL) fruit juice.  4-6 crackers.  6 chicken nuggets.  6 oz (170 g) unsweetened dry cereal.  6 oz (170 g) plain fat-free yogurt or yogurt sweetened with artificial sweeteners.  8 oz (240 mL) milk.  8 oz (170 g) fresh fruit or one small piece of fruit.  24 oz (680 g) popped popcorn. Example of carbohydrate counting Sample meal  3 oz (85 g) chicken breast.  6 oz (  170 g) brown rice.  4 oz (113 g) corn.  8 oz (240 mL) milk.  8 oz (170 g) strawberries with sugar-free whipped topping. Carbohydrate calculation 1. Identify the foods that contain carbohydrates:  Rice.  Corn.  Milk.  Strawberries. 2. Calculate how many servings you have of each food:  2 servings rice.  1 serving corn.  1 serving milk.  1 serving strawberries. 3. Multiply each number of servings by 15 g:  2 servings rice x 15 g = 30 g.  1 serving corn x 15 g = 15 g.  1 serving milk x 15 g = 15  g.  1 serving strawberries x 15 g = 15 g. 4. Add together all of the amounts to find the total grams of carbohydrates eaten:  30 g + 15 g + 15 g + 15 g = 75 g of carbohydrates total. This information is not intended to replace advice given to you by your health care provider. Make sure you discuss any questions you have with your health care provider. Document Released: 07/20/2005 Document Revised: 02/07/2016 Document Reviewed: 01/01/2016 Elsevier Interactive Patient Education  2017 Elsevier Inc.  

## 2016-12-04 NOTE — Addendum Note (Signed)
Addended by: Caffie Pinto on: 12/04/2016 04:48 PM   Modules accepted: Orders

## 2016-12-05 LAB — COMPREHENSIVE METABOLIC PANEL
ALK PHOS: 60 U/L (ref 40–115)
ALT: 15 U/L (ref 9–46)
AST: 20 U/L (ref 10–40)
Albumin: 3.9 g/dL (ref 3.6–5.1)
BILIRUBIN TOTAL: 0.6 mg/dL (ref 0.2–1.2)
BUN: 9 mg/dL (ref 7–25)
CALCIUM: 8.7 mg/dL (ref 8.6–10.3)
CO2: 31 mmol/L (ref 20–31)
CREATININE: 0.98 mg/dL (ref 0.60–1.35)
Chloride: 107 mmol/L (ref 98–110)
GLUCOSE: 96 mg/dL (ref 65–99)
Potassium: 4.1 mmol/L (ref 3.5–5.3)
SODIUM: 142 mmol/L (ref 135–146)
Total Protein: 6.6 g/dL (ref 6.1–8.1)

## 2016-12-05 LAB — LIPID PANEL
Cholesterol: 184 mg/dL (ref <200)
HDL: 32 mg/dL — ABNORMAL LOW (ref >40)
LDL CALC: 108 mg/dL — AB (ref <100)
Total CHOL/HDL Ratio: 5.8 Ratio — ABNORMAL HIGH (ref <5.0)
Triglycerides: 221 mg/dL — ABNORMAL HIGH (ref <150)
VLDL: 44 mg/dL — ABNORMAL HIGH (ref <30)

## 2016-12-05 LAB — MICROALBUMIN / CREATININE URINE RATIO
CREATININE, URINE: 108 mg/dL (ref 20–370)
MICROALB/CREAT RATIO: 5 ug/mg{creat} (ref <30)
Microalb, Ur: 0.5 mg/dL

## 2016-12-05 LAB — HEMOGLOBIN A1C
HEMOGLOBIN A1C: 5.6 % (ref <5.7)
MEAN PLASMA GLUCOSE: 114 mg/dL

## 2016-12-07 ENCOUNTER — Encounter: Payer: Self-pay | Admitting: Gastroenterology

## 2016-12-07 ENCOUNTER — Other Ambulatory Visit: Payer: Self-pay | Admitting: Family Medicine

## 2016-12-07 DIAGNOSIS — E785 Hyperlipidemia, unspecified: Secondary | ICD-10-CM

## 2016-12-07 DIAGNOSIS — E119 Type 2 diabetes mellitus without complications: Secondary | ICD-10-CM

## 2016-12-15 ENCOUNTER — Encounter (HOSPITAL_BASED_OUTPATIENT_CLINIC_OR_DEPARTMENT_OTHER): Payer: Self-pay

## 2016-12-15 ENCOUNTER — Emergency Department (HOSPITAL_BASED_OUTPATIENT_CLINIC_OR_DEPARTMENT_OTHER): Payer: Managed Care, Other (non HMO)

## 2016-12-15 ENCOUNTER — Emergency Department (HOSPITAL_BASED_OUTPATIENT_CLINIC_OR_DEPARTMENT_OTHER)
Admission: EM | Admit: 2016-12-15 | Discharge: 2016-12-16 | Disposition: A | Discharge status: Home / Self Care | Payer: Managed Care, Other (non HMO) | Attending: Emergency Medicine | Admitting: Emergency Medicine

## 2016-12-15 DIAGNOSIS — Z87891 Personal history of nicotine dependence: Secondary | ICD-10-CM | POA: Diagnosis not present

## 2016-12-15 DIAGNOSIS — E119 Type 2 diabetes mellitus without complications: Secondary | ICD-10-CM | POA: Diagnosis not present

## 2016-12-15 DIAGNOSIS — S8002XA Contusion of left knee, initial encounter: Secondary | ICD-10-CM | POA: Diagnosis not present

## 2016-12-15 DIAGNOSIS — S20212A Contusion of left front wall of thorax, initial encounter: Secondary | ICD-10-CM | POA: Diagnosis not present

## 2016-12-15 DIAGNOSIS — I10 Essential (primary) hypertension: Secondary | ICD-10-CM | POA: Diagnosis not present

## 2016-12-15 DIAGNOSIS — S39012A Strain of muscle, fascia and tendon of lower back, initial encounter: Secondary | ICD-10-CM

## 2016-12-15 DIAGNOSIS — Y9389 Activity, other specified: Secondary | ICD-10-CM | POA: Insufficient documentation

## 2016-12-15 DIAGNOSIS — S299XXA Unspecified injury of thorax, initial encounter: Secondary | ICD-10-CM | POA: Diagnosis present

## 2016-12-15 DIAGNOSIS — Y998 Other external cause status: Secondary | ICD-10-CM | POA: Insufficient documentation

## 2016-12-15 DIAGNOSIS — Y92410 Unspecified street and highway as the place of occurrence of the external cause: Secondary | ICD-10-CM | POA: Insufficient documentation

## 2016-12-15 DIAGNOSIS — Z79899 Other long term (current) drug therapy: Secondary | ICD-10-CM | POA: Insufficient documentation

## 2016-12-15 DIAGNOSIS — S161XXA Strain of muscle, fascia and tendon at neck level, initial encounter: Secondary | ICD-10-CM | POA: Diagnosis not present

## 2016-12-15 MED ORDER — HYDROCODONE-ACETAMINOPHEN 5-325 MG PO TABS
1.0000 | ORAL_TABLET | Freq: Once | ORAL | Status: AC
  Administered 2016-12-16: 1 via ORAL
  Filled 2016-12-15: qty 1

## 2016-12-15 NOTE — ED Notes (Signed)
ED Provider at bedside. 
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
## 2016-12-16 ENCOUNTER — Emergency Department (HOSPITAL_BASED_OUTPATIENT_CLINIC_OR_DEPARTMENT_OTHER): Payer: Managed Care, Other (non HMO)

## 2016-12-16 MED ORDER — HYDROCODONE-ACETAMINOPHEN 10-325 MG PO TABS: 1.0000 | ORAL_TABLET | Freq: Four times a day (QID) | ORAL | 0 refills | Status: DC | PRN

## 2016-12-16 NOTE — ED Notes (Signed)
Pt verbalizes understanding of d/c instructions and denies any further needs at this time. 

## 2017-01-07 ENCOUNTER — Ambulatory Visit: Payer: Managed Care, Other (non HMO) | Admitting: Gastroenterology

## 2017-01-11 ENCOUNTER — Encounter: Payer: Self-pay | Admitting: Family Medicine

## 2017-01-14 ENCOUNTER — Other Ambulatory Visit: Payer: Self-pay | Admitting: Family Medicine

## 2017-01-14 DIAGNOSIS — I10 Essential (primary) hypertension: Secondary | ICD-10-CM

## 2017-01-14 MED ORDER — AMLODIPINE BESYLATE 10 MG PO TABS: 10.0000 mg | ORAL_TABLET | Freq: Every day | ORAL | 0 refills | Status: DC

## 2017-01-19 ENCOUNTER — Other Ambulatory Visit: Payer: Self-pay | Admitting: Family Medicine

## 2017-01-20 ENCOUNTER — Other Ambulatory Visit: Payer: Self-pay | Admitting: Family Medicine

## 2017-01-26 ENCOUNTER — Telehealth: Payer: Self-pay | Admitting: Family Medicine

## 2017-01-26 NOTE — Telephone Encounter (Signed)
Caller name: Relationship to patient: Self Can be reached: [phone] Pharmacy:  Reason for call: Please call patient about his medications

## 2017-01-27 NOTE — Telephone Encounter (Signed)
Patient was requesting valsarten.  That medication was taken off his list in March by Primary Care at [facility].  He saw you in May and was not put on list.  Do you want him on this medication?  He is due back for an office visit anyway.  Do you want to him to be seen first before we make that call?

## 2017-01-28 NOTE — Telephone Encounter (Signed)
Ok to send it in

## 2017-01-28 NOTE — Telephone Encounter (Signed)
Need to call him and find out what is going on with that med---

## 2017-01-28 NOTE — Telephone Encounter (Signed)
From speaking with him nothing was going on.  It was deleted by another office.

## 2017-01-29 MED ORDER — VALSARTAN 320 MG PO TABS: 320.0000 mg | ORAL_TABLET | Freq: Every day | ORAL | 1 refills | Status: DC

## 2017-01-29 NOTE — Telephone Encounter (Signed)
Patient notified and rx sent to [facility]

## 2017-02-19 ENCOUNTER — Encounter: Payer: Self-pay | Admitting: Family Medicine

## 2017-02-19 ENCOUNTER — Ambulatory Visit (INDEPENDENT_AMBULATORY_CARE_PROVIDER_SITE_OTHER): Payer: Managed Care, Other (non HMO) | Admitting: Family Medicine

## 2017-02-19 VITALS — BP 148/98 | HR 70 | Temp 98.0°F | Resp 16 | Ht 73.0 in | Wt 391.0 lb

## 2017-02-19 DIAGNOSIS — I10 Essential (primary) hypertension: Secondary | ICD-10-CM | POA: Diagnosis not present

## 2017-02-19 DIAGNOSIS — J301 Allergic rhinitis due to pollen: Secondary | ICD-10-CM

## 2017-02-19 MED ORDER — MONTELUKAST SODIUM 10 MG PO TABS: 10.0000 mg | ORAL_TABLET | Freq: Every day | ORAL | 5 refills | Status: DC

## 2017-02-19 MED ORDER — AZELASTINE HCL 0.1 % NA SOLN: 1.0000 | Freq: Two times a day (BID) | NASAL | Status: DC

## 2017-02-19 NOTE — Assessment & Plan Note (Signed)
Uncontrolled Pt just started valsartan --- stopped losartan secondary to diarrhea  rto 2-3 weeks bp check con't norvasc, lasix and metoprolol

## 2017-02-19 NOTE — Progress Notes (Signed)
Patient ID: Sujay Grundman, male   DOB: [birth year], 41 y.o.   MRN: [id number]     Subjective:  I acted as a Education administrator for Dr. Carollee Herter.  [name], [place]   Patient ID: Larnce Schnackenberg, male    DOB: [birth year], 41 y.o.   MRN: [id number]  Chief Complaint  Patient presents with  . Medication Problem    diarrhea    HPI  Patient is in today for diarrhea.  He thinks it may be coming from the losartan.  He also has been drinking a lot of water. Pt allergies have been bothering him as well.  He is using the flonase and claritin .       Patient Care Team: Carollee Herter, Alferd Apa, DO as PCP - General (Family Medicine)   Past Medical History:  Diagnosis Date  . Arthritis    right knee; left shoulder  . Diabetes mellitus without complication ([place])   . Hyperlipidemia   . Hypertension    takes metoprolol daily  . Sleep apnea    sleep study - recommended cpap    Past Surgical History:  Procedure Laterality Date  . KNEE SURGERY  2009   right  . NASAL SEPTOPLASTY W/ TURBINOPLASTY  12/03/2011   Procedure: NASAL SEPTOPLASTY WITH TURBINATE REDUCTION;  Surgeon: Izora Gala, MD;  Location: [place];  Service: ENT;  Laterality: Bilateral;  . NASAL SINUS SURGERY  12/03/2011   Procedure: ENDOSCOPIC SINUS SURGERY;  Surgeon: Izora Gala, MD;  Location: [facility] OR;  Service: ENT;  Laterality: Bilateral;    Family History  Problem Relation Age of Onset  . Diabetes Mother   . Allergies Mother   . Hyperlipidemia Father   . Hypertension Father   . Diabetes Father   . Anesthesia problems Neg Hx   . Hypotension Neg Hx   . Malignant hyperthermia Neg Hx   . Pseudochol deficiency Neg Hx     Social History   Social History  . Marital status: Single    Spouse name: N/A  . Number of children: 0  . Years of education: N/A   Occupational History  . Forklift driver [employer]   Social History Main Topics  . Smoking status: Former Smoker    Packs/day: 0.50    Years: 6.00    Types: Cigars    Quit date:  11/02/2010  . Smokeless tobacco: Never Used  . Alcohol use No  . Drug use: No  . Sexual activity: Not on file   Other Topics Concern  . Not on file   Social History Narrative  . No narrative on file    Outpatient Medications Prior to Visit  Medication Sig Dispense Refill  . amLODipine (NORVASC) 10 MG tablet Take 1 tablet (10 mg total) by mouth daily. 90 tablet 0  . atorvastatin (LIPITOR) 40 MG tablet Take 1 tablet (40 mg total) by mouth daily. 90 tablet 0  . fluticasone (FLONASE) 50 MCG/ACT nasal spray Place 2 sprays into both nostrils daily. 16 g 6  . furosemide (LASIX) 20 MG tablet Take 1 tablet (20 mg total) by mouth daily. 90 tablet 3  . glucose blood (ONETOUCH VERIO) test strip Check blood sugars once daily and as needed. 100 each 12  . HYDROcodone-acetaminophen (NORCO) 10-325 MG tablet Take 1 tablet by mouth every 6 (six) hours as needed (for pain). 20 tablet 0  . loratadine (CLARITIN) 10 MG tablet Take 1 tablet (10 mg total) by mouth daily. 30 tablet 0  . metoprolol (TOPROL-XL) 200 MG  24 hr tablet Take 1 tablet (200 mg total) by mouth daily. 90 tablet 2  . metoprolol (TOPROL-XL) 200 MG 24 hr tablet TAKE 1 TABLET BY MOUTH DAILY 90 tablet 1  . ONETOUCH DELICA LANCETS 76H MISC Check blood sugars once daily and as needed. 100 each 12  . valsartan (DIOVAN) 320 MG tablet Take 1 tablet (320 mg total) by mouth daily. 90 tablet 1  . losartan (COZAAR) 50 MG tablet Take 1 tablet (50 mg total) by mouth daily. 30 tablet 2   Facility-Administered Medications Prior to Visit  Medication Dose Route Frequency Provider Last Rate Last Dose  . hyoscyamine (LEVSIN SL) SL tablet 0.125 mg  0.125 mg Oral Q4H PRN Roma Schanz R, DO        No Known Allergies  Review of Systems  Constitutional: Negative for fever and malaise/fatigue.  HENT: Negative for congestion.   Eyes: Negative for blurred vision.  Respiratory: Negative for cough and shortness of breath.   Cardiovascular: Negative for  chest pain, palpitations and leg swelling.  Gastrointestinal: Positive for diarrhea. Negative for vomiting.  Musculoskeletal: Negative for back pain.  Skin: Negative for rash.  Neurological: Negative for loss of consciousness and headaches.       Objective:    Physical Exam  Constitutional: He is oriented to person, place, and time. Vital signs are normal. He appears well-developed and well-nourished. He is sleeping.  HENT:  Head: Normocephalic and atraumatic.  Mouth/Throat: Oropharynx is clear and moist.  Eyes: Pupils are equal, round, and reactive to light. EOM are normal.  Neck: Normal range of motion. Neck supple. No thyromegaly present.  Cardiovascular: Normal rate and regular rhythm.   No murmur heard. Pulmonary/Chest: Effort normal and breath sounds normal. No respiratory distress. He has no wheezes. He has no rales. He exhibits no tenderness.  Musculoskeletal: He exhibits no edema or tenderness.  Neurological: He is alert and oriented to person, place, and time.  Skin: Skin is warm and dry.  Psychiatric: He has a normal mood and affect. His behavior is normal. Judgment and thought content normal.  Nursing note and vitals reviewed.   BP (!) 148/98   Pulse 70   Temp 98 F (36.7 C) (Oral)   Resp 16   Ht 6\' 1"  (1.854 m)   Wt (!) 391 lb (177.4 kg)   SpO2 94%   BMI 51.59 kg/m  Wt Readings from Last 3 Encounters:  02/19/17 (!) 391 lb (177.4 kg)  12/15/16 (!) 381 lb (172.8 kg)  12/04/16 (!) 387 lb 6.4 oz (175.7 kg)   BP Readings from Last 3 Encounters:  02/19/17 (!) 148/98  12/16/16 137/89  12/04/16 (!) 148/128     Immunization History  Administered Date(s) Administered  . Td 10/16/2008  . Tdap 01/16/2013    Health Maintenance  Topic Date Due  . FOOT EXAM  09/30/2016  . PNEUMOCOCCAL POLYSACCHARIDE VACCINE (1) 04/09/2017 (Originally 04/09/1980)  . INFLUENZA VACCINE  03/03/2017  . OPHTHALMOLOGY EXAM  03/23/2017  . HEMOGLOBIN A1C  06/06/2017  . TETANUS/TDAP   01/17/2023  . HIV Screening  Completed    Lab Results  Component Value Date   WBC 12.3 (H) 12/04/2016   HGB 13.8 12/04/2016   HCT 42.0 12/04/2016   PLT 252 12/04/2016   GLUCOSE 96 12/04/2016   CHOL 184 12/04/2016   TRIG 221 (H) 12/04/2016   HDL 32 (L) 12/04/2016   LDLDIRECT 96.0 04/09/2016   LDLCALC 108 (H) 12/04/2016   ALT 15 12/04/2016  AST 20 12/04/2016   NA 142 12/04/2016   K 4.1 12/04/2016   CL 107 12/04/2016   CREATININE 0.98 12/04/2016   BUN 9 12/04/2016   CO2 31 12/04/2016   TSH 1.59 08/04/2012   INR 0.93 01/16/2013   HGBA1C 5.6 12/04/2016   MICROALBUR 0.5 12/04/2016    Lab Results  Component Value Date   TSH 1.59 08/04/2012   Lab Results  Component Value Date   WBC 12.3 (H) 12/04/2016   HGB 13.8 12/04/2016   HCT 42.0 12/04/2016   MCV 95.9 12/04/2016   PLT 252 12/04/2016   Lab Results  Component Value Date   NA 142 12/04/2016   K 4.1 12/04/2016   CO2 31 12/04/2016   GLUCOSE 96 12/04/2016   BUN 9 12/04/2016   CREATININE 0.98 12/04/2016   BILITOT 0.6 12/04/2016   ALKPHOS 60 12/04/2016   AST 20 12/04/2016   ALT 15 12/04/2016   PROT 6.6 12/04/2016   ALBUMIN 3.9 12/04/2016   CALCIUM 8.7 12/04/2016   GFR 115.51 04/09/2016   Lab Results  Component Value Date   CHOL 184 12/04/2016   Lab Results  Component Value Date   HDL 32 (L) 12/04/2016   Lab Results  Component Value Date   LDLCALC 108 (H) 12/04/2016   Lab Results  Component Value Date   TRIG 221 (H) 12/04/2016   Lab Results  Component Value Date   CHOLHDL 5.8 (H) 12/04/2016   Lab Results  Component Value Date   HGBA1C 5.6 12/04/2016         Assessment & Plan:   Problem List Items Addressed This Visit      Unprioritized   Essential hypertension    Uncontrolled Pt just started valsartan --- stopped losartan secondary to diarrhea  rto 2-3 weeks bp check con't norvasc, lasix and metoprolol       Other Visit Diagnoses    Seasonal allergic rhinitis due to pollen    -   Primary   Relevant Medications   montelukast (SINGULAIR) 10 MG tablet   azelastine (ASTELIN) 0.1 % nasal spray 1 spray      I have discontinued Mr. Mera's losartan. I am also having him start on montelukast. Additionally, I am having him maintain his glucose blood, ONETOUCH DELICA LANCETS 32D, fluticasone, loratadine, furosemide, atorvastatin, metoprolol, HYDROcodone-acetaminophen, amLODipine, metoprolol, and valsartan. We will continue to administer hyoscyamine and azelastine.  Meds ordered this encounter  Medications  . montelukast (SINGULAIR) 10 MG tablet    Sig: Take 1 tablet (10 mg total) by mouth at bedtime.    Dispense:  30 tablet    Refill:  5  . azelastine (ASTELIN) 0.1 % nasal spray 1 spray    CMA served as scribe during this visit. History, Physical and Plan performed by medical provider. Documentation and orders reviewed and attested to.  Ann Held, DO

## 2017-02-19 NOTE — Patient Instructions (Signed)

## 2017-02-26 ENCOUNTER — Ambulatory Visit: Payer: Self-pay | Admitting: Family Medicine

## 2017-03-05 ENCOUNTER — Ambulatory Visit (INDEPENDENT_AMBULATORY_CARE_PROVIDER_SITE_OTHER): Payer: Managed Care, Other (non HMO) | Admitting: Family Medicine

## 2017-03-05 ENCOUNTER — Other Ambulatory Visit (INDEPENDENT_AMBULATORY_CARE_PROVIDER_SITE_OTHER): Payer: Managed Care, Other (non HMO)

## 2017-03-05 VITALS — BP 132/80 | HR 83

## 2017-03-05 DIAGNOSIS — E119 Type 2 diabetes mellitus without complications: Secondary | ICD-10-CM | POA: Diagnosis not present

## 2017-03-05 DIAGNOSIS — I10 Essential (primary) hypertension: Secondary | ICD-10-CM | POA: Diagnosis not present

## 2017-03-05 DIAGNOSIS — E785 Hyperlipidemia, unspecified: Secondary | ICD-10-CM

## 2017-03-05 LAB — COMPREHENSIVE METABOLIC PANEL
ALK PHOS: 57 U/L (ref 39–117)
ALT: 16 U/L (ref 0–53)
AST: 19 U/L (ref 0–37)
Albumin: 3.9 g/dL (ref 3.5–5.2)
BILIRUBIN TOTAL: 0.5 mg/dL (ref 0.2–1.2)
BUN: 10 mg/dL (ref 6–23)
CALCIUM: 8.6 mg/dL (ref 8.4–10.5)
CO2: 29 mEq/L (ref 19–32)
Chloride: 105 mEq/L (ref 96–112)
Creatinine, Ser: 0.9 mg/dL (ref 0.40–1.50)
GFR: 120.87 mL/min (ref >60.00)
Glucose, Bld: 200 mg/dL — ABNORMAL HIGH (ref 70–99)
Potassium: 3.6 mEq/L (ref 3.5–5.1)
Sodium: 140 mEq/L (ref 135–145)
TOTAL PROTEIN: 6.9 g/dL (ref 6.0–8.3)

## 2017-03-05 LAB — LIPID PANEL
Cholesterol: 126 mg/dL (ref 0–200)
HDL: 31.5 mg/dL — AB (ref >39.00)
LDL Cholesterol: 68 mg/dL (ref 0–99)
NonHDL: 94.83
Total CHOL/HDL Ratio: 4
Triglycerides: 136 mg/dL (ref 0.0–149.0)
VLDL: 27.2 mg/dL (ref 0.0–40.0)

## 2017-03-05 LAB — HEMOGLOBIN A1C: Hgb A1c MFr Bld: 6.1 % (ref 4.6–6.5)

## 2017-03-05 MED ORDER — TELMISARTAN 40 MG PO TABS: 40.0000 mg | ORAL_TABLET | Freq: Every day | ORAL | 0 refills | Status: DC

## 2017-03-05 NOTE — Progress Notes (Signed)
Pre visit review using our clinic review tool, if applicable. No additional management support is needed unless otherwise documented below in the visit note.  Patient presents in office for blood pressure check per OV note 02/19/17. He voiced compliance with all medications. Patient denies chest pain, headaches, dizziness, lightheadedness & numbness/tingling in the limbs. RN obtained the following readings during today's visit: BP 132/80 P 83 & 02 94%.  Per Dr. Nani Ravens: STOP Valsartan (Diovan). START Telmisartan (Micardis) 40 MG once daily. Follow-up with PCP in 2 weeks.  Patient was made aware of the provider's recommendations & verbalized understanding. Next appointment 03/19/17 at 1:15 PM.

## 2017-03-05 NOTE — Patient Instructions (Addendum)
Per Dr. Nani Ravens: STOP Valsartan (Diovan). START Telmisartan (Micardis) 40 MG once daily. Follow-up with PCP in 2 weeks.

## 2017-03-05 NOTE — Progress Notes (Signed)
Noted. Agree with above.  

## 2017-03-09 ENCOUNTER — Other Ambulatory Visit: Payer: Managed Care, Other (non HMO)

## 2017-03-19 ENCOUNTER — Telehealth: Payer: Self-pay | Admitting: Family Medicine

## 2017-03-19 ENCOUNTER — Ambulatory Visit: Payer: Managed Care, Other (non HMO) | Admitting: Family Medicine

## 2017-03-19 DIAGNOSIS — Z0289 Encounter for other administrative examinations: Secondary | ICD-10-CM

## 2017-03-19 NOTE — Telephone Encounter (Signed)
Patient unable to make he's 1:15pm appointment and denied [facility] appointment due to financial reasons, will call back to [facility], charge or no charge

## 2017-03-19 NOTE — Telephone Encounter (Signed)
charge 

## 2017-04-26 ENCOUNTER — Other Ambulatory Visit: Payer: Self-pay | Admitting: Family Medicine

## 2017-06-10 ENCOUNTER — Encounter: Payer: Self-pay | Admitting: Family Medicine

## 2017-06-10 ENCOUNTER — Ambulatory Visit (INDEPENDENT_AMBULATORY_CARE_PROVIDER_SITE_OTHER): Payer: Managed Care, Other (non HMO) | Admitting: Family Medicine

## 2017-06-10 VITALS — BP 135/79 | HR 75 | Temp 97.8°F | Ht 73.0 in | Wt 380.0 lb

## 2017-06-10 DIAGNOSIS — E1169 Type 2 diabetes mellitus with other specified complication: Secondary | ICD-10-CM

## 2017-06-10 DIAGNOSIS — Z23 Encounter for immunization: Secondary | ICD-10-CM

## 2017-06-10 DIAGNOSIS — M5442 Lumbago with sciatica, left side: Secondary | ICD-10-CM

## 2017-06-10 DIAGNOSIS — M79605 Pain in left leg: Secondary | ICD-10-CM

## 2017-06-10 DIAGNOSIS — E785 Hyperlipidemia, unspecified: Secondary | ICD-10-CM | POA: Diagnosis not present

## 2017-06-10 DIAGNOSIS — G8929 Other chronic pain: Secondary | ICD-10-CM | POA: Diagnosis not present

## 2017-06-10 DIAGNOSIS — M545 Low back pain, unspecified: Secondary | ICD-10-CM

## 2017-06-10 DIAGNOSIS — Z0000 Encounter for general adult medical examination without abnormal findings: Secondary | ICD-10-CM

## 2017-06-10 DIAGNOSIS — I10 Essential (primary) hypertension: Secondary | ICD-10-CM | POA: Diagnosis not present

## 2017-06-10 DIAGNOSIS — R197 Diarrhea, unspecified: Secondary | ICD-10-CM | POA: Diagnosis not present

## 2017-06-10 LAB — LIPID PANEL
CHOLESTEROL: 213 mg/dL — AB (ref 0–200)
HDL: 30.7 mg/dL — AB (ref >39.00)
NonHDL: 181.99
Total CHOL/HDL Ratio: 7
Triglycerides: 250 mg/dL — ABNORMAL HIGH (ref 0.0–149.0)
VLDL: 50 mg/dL — AB (ref 0.0–40.0)

## 2017-06-10 LAB — CBC WITH DIFFERENTIAL/PLATELET
BASOS PCT: 0.6 % (ref 0.0–3.0)
Basophils Absolute: 0.1 10*3/uL (ref 0.0–0.1)
EOS ABS: 0.3 10*3/uL (ref 0.0–0.7)
EOS PCT: 3 % (ref 0.0–5.0)
HEMATOCRIT: 45.5 % (ref 39.0–52.0)
HEMOGLOBIN: 14.8 g/dL (ref 13.0–17.0)
LYMPHS PCT: 31.8 % (ref 12.0–46.0)
Lymphs Abs: 3.6 10*3/uL (ref 0.7–4.0)
MCHC: 32.6 g/dL (ref 30.0–36.0)
MCV: 97.7 fl (ref 78.0–100.0)
MONO ABS: 0.9 10*3/uL (ref 0.1–1.0)
Monocytes Relative: 8.1 % (ref 3.0–12.0)
NEUTROS ABS: 6.4 10*3/uL (ref 1.4–7.7)
Neutrophils Relative %: 56.5 % (ref 43.0–77.0)
PLATELETS: 253 10*3/uL (ref 150.0–400.0)
RBC: 4.66 Mil/uL (ref 4.22–5.81)
RDW: 12.8 % (ref 11.5–15.5)
WBC: 11.4 10*3/uL — AB (ref 4.0–10.5)

## 2017-06-10 LAB — LDL CHOLESTEROL, DIRECT: LDL DIRECT: 135 mg/dL

## 2017-06-10 LAB — COMPREHENSIVE METABOLIC PANEL
ALBUMIN: 4.2 g/dL (ref 3.5–5.2)
ALT: 20 U/L (ref 0–53)
AST: 23 U/L (ref 0–37)
Alkaline Phosphatase: 63 U/L (ref 39–117)
BUN: 13 mg/dL (ref 6–23)
CALCIUM: 9.8 mg/dL (ref 8.4–10.5)
CHLORIDE: 103 meq/L (ref 96–112)
CO2: 28 meq/L (ref 19–32)
Creatinine, Ser: 0.94 mg/dL (ref 0.40–1.50)
GFR: 114.8 mL/min (ref >60.00)
Glucose, Bld: 122 mg/dL — ABNORMAL HIGH (ref 70–99)
POTASSIUM: 4 meq/L (ref 3.5–5.1)
Sodium: 138 mEq/L (ref 135–145)
Total Bilirubin: 0.6 mg/dL (ref 0.2–1.2)
Total Protein: 7.7 g/dL (ref 6.0–8.3)

## 2017-06-10 LAB — MICROALBUMIN / CREATININE URINE RATIO
CREATININE, U: 243.8 mg/dL
MICROALB UR: 1.2 mg/dL (ref 0.0–1.9)
MICROALB/CREAT RATIO: 0.5 mg/g (ref 0.0–30.0)

## 2017-06-10 LAB — HEMOGLOBIN A1C: HEMOGLOBIN A1C: 6.1 % (ref 4.6–6.5)

## 2017-06-10 LAB — TSH: TSH: 1.77 u[IU]/mL (ref 0.35–4.50)

## 2017-06-10 NOTE — Patient Instructions (Signed)
Preventive Care 18-39 Years, Male Preventive care refers to lifestyle choices and visits with your health care provider that can promote health and wellness. What does preventive care include?  A yearly physical exam. This is also called an annual well check.  Dental exams once or twice a year.  Routine eye exams. Ask your health care provider how often you should have your eyes checked.  Personal lifestyle choices, including: ? Daily care of your teeth and gums. ? Regular physical activity. ? Eating a healthy diet. ? Avoiding tobacco and drug use. ? Limiting alcohol use. ? Practicing safe sex. What happens during an annual well check? The services and screenings done by your health care provider during your annual well check will depend on your age, overall health, lifestyle risk factors, and family history of disease. Counseling Your health care provider may ask you questions about your:  Alcohol use.  Tobacco use.  Drug use.  Emotional well-being.  Home and relationship well-being.  Sexual activity.  Eating habits.  Work and work environment.  Screening You may have the following tests or measurements:  Height, weight, and BMI.  Blood pressure.  Lipid and cholesterol levels. These may be checked every 5 years starting at age 20.  Diabetes screening. This is done by checking your blood sugar (glucose) after you have not eaten for a while (fasting).  Skin check.  Hepatitis C blood test.  Hepatitis B blood test.  Sexually transmitted disease (STD) testing.  Discuss your test results, treatment options, and if necessary, the need for more tests with your health care provider. Vaccines Your health care provider may recommend certain vaccines, such as:  Influenza vaccine. This is recommended every year.  Tetanus, diphtheria, and acellular pertussis (Tdap, Td) vaccine. You may need a Td booster every 10 years.  Varicella vaccine. You may need this if you  have not been vaccinated.  HPV vaccine. If you are 26 or younger, you may need three doses over 6 months.  Measles, mumps, and rubella (MMR) vaccine. You may need at least one dose of MMR.You may also need a second dose.  Pneumococcal 13-valent conjugate (PCV13) vaccine. You may need this if you have certain conditions and have not been vaccinated.  Pneumococcal polysaccharide (PPSV23) vaccine. You may need one or two doses if you smoke cigarettes or if you have certain conditions.  Meningococcal vaccine. One dose is recommended if you are age 19-21 years and a first-year college student living in a residence hall, or if you have one of several medical conditions. You may also need additional booster doses.  Hepatitis A vaccine. You may need this if you have certain conditions or if you travel or work in places where you may be exposed to hepatitis A.  Hepatitis B vaccine. You may need this if you have certain conditions or if you travel or work in places where you may be exposed to hepatitis B.  Haemophilus influenzae type b (Hib) vaccine. You may need this if you have certain risk factors.  Talk to your health care provider about which screenings and vaccines you need and how often you need them. This information is not intended to replace advice given to you by your health care provider. Make sure you discuss any questions you have with your health care provider. Document Released: 09/15/2001 Document Revised: 04/08/2016 Document Reviewed: 05/21/2015 Elsevier Interactive Patient Education  2017 [employer]  
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
## 2017-06-11 NOTE — Assessment & Plan Note (Signed)
Tolerating statin, encouraged heart healthy diet, avoid trans fats, minimize simple carbs and saturated fats. Increase exercise as tolerated 

## 2017-06-11 NOTE — Assessment & Plan Note (Signed)
Pain worsening and radiating down leg MRI ordered

## 2017-06-11 NOTE — Assessment & Plan Note (Signed)
Well controlled, no changes to meds. Encouraged heart healthy diet such as the DASH diet and exercise as tolerated.  °

## 2017-06-11 NOTE — Assessment & Plan Note (Signed)
hgba1c acceptable, minimize simple carbs. Increase exercise as tolerated. Continue current meds 

## 2017-06-15 ENCOUNTER — Telehealth: Payer: Self-pay | Admitting: *Deleted

## 2017-06-15 ENCOUNTER — Other Ambulatory Visit: Payer: Self-pay | Admitting: *Deleted

## 2017-06-15 ENCOUNTER — Other Ambulatory Visit: Payer: Self-pay | Admitting: Family Medicine

## 2017-06-15 DIAGNOSIS — I10 Essential (primary) hypertension: Secondary | ICD-10-CM

## 2017-06-15 DIAGNOSIS — G8929 Other chronic pain: Secondary | ICD-10-CM

## 2017-06-15 DIAGNOSIS — E1165 Type 2 diabetes mellitus with hyperglycemia: Secondary | ICD-10-CM

## 2017-06-15 DIAGNOSIS — M5440 Lumbago with sciatica, unspecified side: Principal | ICD-10-CM

## 2017-06-15 DIAGNOSIS — E785 Hyperlipidemia, unspecified: Secondary | ICD-10-CM

## 2017-06-15 MED ORDER — ROSUVASTATIN CALCIUM 20 MG PO TABS: 20.0000 mg | ORAL_TABLET | Freq: Every day | ORAL | 2 refills | Status: DC

## 2017-06-15 NOTE — Telephone Encounter (Signed)
See my chart message

## 2017-06-17 ENCOUNTER — Encounter: Payer: Self-pay | Admitting: Family Medicine

## 2017-06-17 DIAGNOSIS — E785 Hyperlipidemia, unspecified: Secondary | ICD-10-CM

## 2017-06-18 MED ORDER — ROSUVASTATIN CALCIUM 20 MG PO TABS: 20.0000 mg | ORAL_TABLET | Freq: Every day | ORAL | 2 refills | Status: DC

## 2017-06-22 ENCOUNTER — Encounter (INDEPENDENT_AMBULATORY_CARE_PROVIDER_SITE_OTHER): Payer: Self-pay | Admitting: Specialist

## 2017-06-22 ENCOUNTER — Ambulatory Visit (INDEPENDENT_AMBULATORY_CARE_PROVIDER_SITE_OTHER): Payer: Managed Care, Other (non HMO)

## 2017-06-22 ENCOUNTER — Ambulatory Visit (INDEPENDENT_AMBULATORY_CARE_PROVIDER_SITE_OTHER): Payer: Managed Care, Other (non HMO) | Admitting: Specialist

## 2017-06-22 ENCOUNTER — Telehealth (INDEPENDENT_AMBULATORY_CARE_PROVIDER_SITE_OTHER): Payer: Self-pay | Admitting: Specialist

## 2017-06-22 VITALS — BP 124/84 | HR 76 | Ht 73.0 in | Wt 380.0 lb

## 2017-06-22 DIAGNOSIS — M4156 Other secondary scoliosis, lumbar region: Secondary | ICD-10-CM

## 2017-06-22 DIAGNOSIS — G8929 Other chronic pain: Secondary | ICD-10-CM

## 2017-06-22 DIAGNOSIS — M4316 Spondylolisthesis, lumbar region: Secondary | ICD-10-CM | POA: Diagnosis not present

## 2017-06-22 DIAGNOSIS — M5442 Lumbago with sciatica, left side: Secondary | ICD-10-CM | POA: Diagnosis not present

## 2017-06-22 DIAGNOSIS — M5416 Radiculopathy, lumbar region: Secondary | ICD-10-CM | POA: Diagnosis not present

## 2017-06-22 DIAGNOSIS — M4726 Other spondylosis with radiculopathy, lumbar region: Secondary | ICD-10-CM | POA: Diagnosis not present

## 2017-06-22 DIAGNOSIS — S22080A Wedge compression fracture of T11-T12 vertebra, initial encounter for closed fracture: Secondary | ICD-10-CM

## 2017-06-22 DIAGNOSIS — M5136 Other intervertebral disc degeneration, lumbar region: Secondary | ICD-10-CM

## 2017-06-22 MED ORDER — HYDROCODONE-ACETAMINOPHEN 10-325 MG PO TABS: 1.0000 | ORAL_TABLET | Freq: Four times a day (QID) | ORAL | 0 refills | Status: DC | PRN

## 2017-06-22 MED ORDER — METHYLPREDNISOLONE 4 MG PO TBPK: ORAL_TABLET | ORAL | 0 refills | Status: DC

## 2017-06-22 MED ORDER — GABAPENTIN 300 MG PO CAPS: 300.0000 mg | ORAL_CAPSULE | Freq: Every day | ORAL | 2 refills | Status: DC

## 2017-06-22 NOTE — Telephone Encounter (Signed)
I called and advised that I was going to mail it to him but if he would like to come back and get it he could and he asked to come back and get it so I put it at the front desk for him to pick up

## 2017-06-22 NOTE — Patient Instructions (Addendum)
Avoid frequent bending and stooping  No lifting greater than 20 lbs. May use ice or moist heat for pain. Weight loss is of benefit. Handicap license is approved. MRI ordered lumbar spine
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
## 2017-06-22 NOTE — Telephone Encounter (Signed)
Patient called asking about a handicap placard. CB # [phone]
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
## 2017-07-14 ENCOUNTER — Telehealth (INDEPENDENT_AMBULATORY_CARE_PROVIDER_SITE_OTHER): Payer: Self-pay | Admitting: Specialist

## 2017-07-14 NOTE — Telephone Encounter (Signed)
Message sent in error

## 2017-07-21 ENCOUNTER — Telehealth (INDEPENDENT_AMBULATORY_CARE_PROVIDER_SITE_OTHER): Payer: Self-pay | Admitting: Specialist

## 2017-07-21 ENCOUNTER — Ambulatory Visit
Admission: RE | Admit: 2017-07-21 | Discharge: 2017-07-21 | Disposition: A | Discharge status: Home / Self Care | Payer: Managed Care, Other (non HMO) | Source: Ambulatory Visit | Attending: Specialist | Admitting: Specialist

## 2017-07-21 DIAGNOSIS — M4316 Spondylolisthesis, lumbar region: Secondary | ICD-10-CM

## 2017-07-21 NOTE — Telephone Encounter (Signed)
Patient called advised he could not complete the MRI today because it was a closed area and he just could not complete it. The number to contact patient is [phone]

## 2017-07-22 NOTE — Telephone Encounter (Signed)
Patient called advised he could not complete the MRI today because it was a closed area and he just could not complete it.------Can you please schedule pt for an open unit?

## 2017-07-29 NOTE — Telephone Encounter (Signed)
I C [facility] imaging and sw Manus Gunning and she stated pt was scheduled in room 3 which is an open unit so not sure why could not continue with MRI. Questionable if pt wants something to help relax him or if wants to go some place else to get MRI done. I C pt left message to return call. Pending call back from pt

## 2017-08-06 ENCOUNTER — Other Ambulatory Visit: Payer: Self-pay | Admitting: Family Medicine

## 2017-08-06 DIAGNOSIS — J0110 Acute frontal sinusitis, unspecified: Secondary | ICD-10-CM

## 2017-08-11 ENCOUNTER — Ambulatory Visit (INDEPENDENT_AMBULATORY_CARE_PROVIDER_SITE_OTHER): Payer: Managed Care, Other (non HMO) | Admitting: Specialist

## 2017-08-11 ENCOUNTER — Encounter (INDEPENDENT_AMBULATORY_CARE_PROVIDER_SITE_OTHER): Payer: Self-pay | Admitting: Specialist

## 2017-08-11 VITALS — BP 141/95 | HR 70 | Ht 73.0 in | Wt 380.0 lb

## 2017-08-11 DIAGNOSIS — M545 Low back pain, unspecified: Secondary | ICD-10-CM

## 2017-08-11 DIAGNOSIS — M542 Cervicalgia: Secondary | ICD-10-CM

## 2017-08-11 DIAGNOSIS — M47814 Spondylosis without myelopathy or radiculopathy, thoracic region: Secondary | ICD-10-CM | POA: Diagnosis not present

## 2017-08-11 DIAGNOSIS — M546 Pain in thoracic spine: Secondary | ICD-10-CM | POA: Diagnosis not present

## 2017-08-11 DIAGNOSIS — M47812 Spondylosis without myelopathy or radiculopathy, cervical region: Secondary | ICD-10-CM | POA: Diagnosis not present

## 2017-08-11 MED ORDER — DICLOFENAC-MISOPROSTOL 50-0.2 MG PO TBEC: 1.0000 | DELAYED_RELEASE_TABLET | Freq: Three times a day (TID) | ORAL | 3 refills | Status: DC
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
## 2017-08-11 NOTE — Patient Instructions (Addendum)
Avoid frequent bending and stooping  No lifting greater than 25 lbs. May use ice or moist heat for pain. Weight loss is of benefit. Handicap license is approved. CT scan of the thoracic and lumbar spine as pain persists and he is unable to have MRI due to claustrophobia. Assess for an occult compression fracture T11 or T12.  Arthrotec 50-200 tablet TID with meals. PT at [facility] for cervical, lumbar and thoracic

## 2017-08-26 ENCOUNTER — Other Ambulatory Visit: Payer: Self-pay

## 2017-08-26 ENCOUNTER — Ambulatory Visit: Payer: Managed Care, Other (non HMO) | Attending: Specialist | Admitting: Physical Therapy

## 2017-08-26 ENCOUNTER — Encounter: Payer: Self-pay | Admitting: Physical Therapy

## 2017-08-26 DIAGNOSIS — M545 Low back pain, unspecified: Secondary | ICD-10-CM

## 2017-08-26 DIAGNOSIS — M6281 Muscle weakness (generalized): Secondary | ICD-10-CM

## 2017-08-26 DIAGNOSIS — M542 Cervicalgia: Secondary | ICD-10-CM | POA: Diagnosis not present

## 2017-08-26 DIAGNOSIS — M546 Pain in thoracic spine: Secondary | ICD-10-CM

## 2017-08-26 DIAGNOSIS — G8929 Other chronic pain: Secondary | ICD-10-CM

## 2017-08-26 DIAGNOSIS — R293 Abnormal posture: Secondary | ICD-10-CM | POA: Diagnosis present

## 2017-08-26 NOTE — Patient Instructions (Signed)
KNEE: Quadriceps - Prone   Place strap around ankle. Bring ankle toward buttocks. Press hip into surface. Hold __30_ seconds. _3__ reps per set.  Hamstring Step 2   Left foot relaxed, knee straight, other leg bent, foot flat. Raise straight leg further upward to maximal range. Hold __30_ seconds. Relax leg completely down. Repeat __3_ times.  Strengthening: Straight Leg Raise (Phase 1)   Tighten muscles on front of right thigh, then lift leg _~8___ inches from surface, keeping knee locked.  Repeat __15__ times per set. Do __2__ sets per session.   Lumbar Rotation (Non-Weight Bearing)   Feet on floor, slowly rock knees from side to side in small, pain-free range of motion. Allow lower back to rotate slightly. Repeat __10__ times per set.    Lumbar Rotation: Caudal - Bilateral, Advanced (Supine)   Feet and knees together, arms outstretched, bring knees toward chest. Rotate knees to left, turning head in opposite direction until stretch is felt. Hold __10__ seconds. Relax. Repeat _10-15___ times per set. Repeat on other side.   Bridging   Slowly raise buttocks from floor, keeping stomach tight. Repeat _10___ times per set. Do __2__ sets per session.   Axial Extension (Chin Tuck)   Pull chin in and lengthen back of neck. Hold __5-10__ seconds while counting out loud. Repeat __15__ times.   Scapular Retraction (Standing)   With arms at sides, pinch shoulder blades together. Repeat __15__ times per set. Do _2___ sets per session.
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
## 2017-08-27 ENCOUNTER — Ambulatory Visit (HOSPITAL_BASED_OUTPATIENT_CLINIC_OR_DEPARTMENT_OTHER)
Admission: RE | Admit: 2017-08-27 | Discharge: 2017-08-27 | Disposition: A | Discharge status: Home / Self Care | Payer: Managed Care, Other (non HMO) | Source: Ambulatory Visit | Attending: Specialist | Admitting: Specialist

## 2017-08-27 DIAGNOSIS — M47897 Other spondylosis, lumbosacral region: Secondary | ICD-10-CM | POA: Insufficient documentation

## 2017-08-27 DIAGNOSIS — M48061 Spinal stenosis, lumbar region without neurogenic claudication: Secondary | ICD-10-CM | POA: Diagnosis not present

## 2017-08-27 DIAGNOSIS — M545 Low back pain, unspecified: Secondary | ICD-10-CM

## 2017-08-27 DIAGNOSIS — M5136 Other intervertebral disc degeneration, lumbar region: Secondary | ICD-10-CM | POA: Diagnosis not present

## 2017-09-10 ENCOUNTER — Ambulatory Visit (INDEPENDENT_AMBULATORY_CARE_PROVIDER_SITE_OTHER): Payer: Managed Care, Other (non HMO) | Admitting: Specialist

## 2017-09-10 ENCOUNTER — Encounter (INDEPENDENT_AMBULATORY_CARE_PROVIDER_SITE_OTHER): Payer: Self-pay | Admitting: Specialist

## 2017-09-10 VITALS — BP 139/84 | HR 71 | Ht 72.0 in | Wt 380.0 lb

## 2017-09-10 DIAGNOSIS — M48062 Spinal stenosis, lumbar region with neurogenic claudication: Secondary | ICD-10-CM

## 2017-09-10 DIAGNOSIS — M4726 Other spondylosis with radiculopathy, lumbar region: Secondary | ICD-10-CM | POA: Diagnosis not present

## 2017-09-10 NOTE — Patient Instructions (Signed)
Avoid bending, stooping and avoid lifting weights greater than 10 lbs. Avoid prolong standing and walking. Avoid frequent bending and stooping  No lifting greater than 10 lbs. May use ice or moist heat for pain. Weight loss is of benefit. Handicap license is approved. Dr. Newton's secretary/Assistant will call to arrange for epidural steroid injection  
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
## 2017-10-14 ENCOUNTER — Ambulatory Visit (INDEPENDENT_AMBULATORY_CARE_PROVIDER_SITE_OTHER): Payer: Managed Care, Other (non HMO) | Admitting: Specialist

## 2017-10-21 ENCOUNTER — Encounter: Payer: Self-pay | Admitting: Urgent Care

## 2017-10-21 ENCOUNTER — Ambulatory Visit: Payer: Self-pay | Admitting: Urgent Care

## 2017-10-21 VITALS — BP 134/83 | HR 79 | Temp 98.9°F | Resp 18 | Ht 72.0 in | Wt 398.2 lb

## 2017-10-21 DIAGNOSIS — R7303 Prediabetes: Secondary | ICD-10-CM

## 2017-10-21 DIAGNOSIS — I10 Essential (primary) hypertension: Secondary | ICD-10-CM

## 2017-10-21 DIAGNOSIS — Z024 Encounter for examination for driving license: Secondary | ICD-10-CM

## 2017-10-21 NOTE — Patient Instructions (Signed)
Health Maintenance, Male A healthy lifestyle and preventive care is important for your health and wellness. Ask your health care provider about what schedule of regular examinations is right for you. What should I know about weight and diet? Eat a Healthy Diet  Eat plenty of vegetables, fruits, whole grains, low-fat dairy products, and lean protein.  Do not eat a lot of foods high in solid fats, added sugars, or salt.  Maintain a Healthy Weight Regular exercise can help you achieve or maintain a healthy weight. You should:  Do at least 150 minutes of exercise each week. The exercise should increase your heart rate and make you sweat (moderate-intensity exercise).  Do strength-training exercises at least twice a week.  Watch Your Levels of Cholesterol and Blood Lipids  Have your blood tested for lipids and cholesterol every 5 years starting at 40 years of age. If you are at high risk for heart disease, you should start having your blood tested when you are 40 years old. You may need to have your cholesterol levels checked more often if: ? Your lipid or cholesterol levels are high. ? You are older than 40 years of age. ? You are at high risk for heart disease.  What should I know about cancer screening? Many types of cancers can be detected early and may often be prevented. Lung Cancer  You should be screened every year for lung cancer if: ? You are a current smoker who has smoked for at least 30 years. ? You are a former smoker who has quit within the past 15 years.  Talk to your health care provider about your screening options, when you should start screening, and how often you should be screened.  Colorectal Cancer  Routine colorectal cancer screening usually begins at 40 years of age and should be repeated every 5-10 years until you are 40 years old. You may need to be screened more often if early forms of precancerous polyps or small growths are found. Your health care provider  may recommend screening at an earlier age if you have risk factors for colon cancer.  Your health care provider may recommend using home test kits to check for hidden blood in the stool.  A small camera at the end of a tube can be used to examine your colon (sigmoidoscopy or colonoscopy). This checks for the earliest forms of colorectal cancer.  Prostate and Testicular Cancer  Depending on your age and overall health, your health care provider may do certain tests to screen for prostate and testicular cancer.  Talk to your health care provider about any symptoms or concerns you have about testicular or prostate cancer.  Skin Cancer  Check your skin from head to toe regularly.  Tell your health care provider about any new moles or changes in moles, especially if: ? There is a change in a mole's size, shape, or color. ? You have a mole that is larger than a pencil eraser.  Always use sunscreen. Apply sunscreen liberally and repeat throughout the day.  Protect yourself by wearing long sleeves, pants, a wide-brimmed hat, and sunglasses when outside.  What should I know about heart disease, diabetes, and high blood pressure?  If you are 18-39 years of age, have your blood pressure checked every 3-5 years. If you are 40 years of age or older, have your blood pressure checked every year. You should have your blood pressure measured twice-once when you are at a hospital or clinic, and once when   you are not at a hospital or clinic. Record the average of the two measurements. To check your blood pressure when you are not at a hospital or clinic, you can use: ? An automated blood pressure machine at a pharmacy. ? A home blood pressure monitor.  Talk to your health care provider about your target blood pressure.  If you are between 45-79 years old, ask your health care provider if you should take aspirin to prevent heart disease.  Have regular diabetes screenings by checking your fasting blood  sugar level. ? If you are at a normal weight and have a low risk for diabetes, have this test once every three years after the age of 45. ? If you are overweight and have a high risk for diabetes, consider being tested at a younger age or more often.  A one-time screening for abdominal aortic aneurysm (AAA) by ultrasound is recommended for men aged 65-75 years who are current or former smokers. What should I know about preventing infection? Hepatitis B If you have a higher risk for hepatitis B, you should be screened for this virus. Talk with your health care provider to find out if you are at risk for hepatitis B infection. Hepatitis C Blood testing is recommended for:  Everyone born from 1945 through 1965.  Anyone with known risk factors for hepatitis C.  Sexually Transmitted Diseases (STDs)  You should be screened each year for STDs including gonorrhea and chlamydia if: ? You are sexually active and are younger than 40 years of age. ? You are older than 40 years of age and your health care provider tells you that you are at risk for this type of infection. ? Your sexual activity has changed since you were last screened and you are at an increased risk for chlamydia or gonorrhea. Ask your health care provider if you are at risk.  Talk with your health care provider about whether you are at high risk of being infected with HIV. Your health care provider may recommend a prescription medicine to help prevent HIV infection.  What else can I do?  Schedule regular health, dental, and eye exams.  Stay current with your vaccines (immunizations).  Do not use any tobacco products, such as cigarettes, chewing tobacco, and e-cigarettes. If you need help quitting, ask your health care provider.  Limit alcohol intake to no more than 2 drinks per day. One drink equals 12 ounces of beer, 5 ounces of wine, or 1 ounces of hard liquor.  Do not use street drugs.  Do not share needles.  Ask your  health care provider for help if you need support or information about quitting drugs.  Tell your health care provider if you often feel depressed.  Tell your health care provider if you have ever been abused or do not feel safe at home. This information is not intended to replace advice given to you by your health care provider. Make sure you discuss any questions you have with your health care provider. Document Released: 01/16/2008 Document Revised: 03/18/2016 Document Reviewed: 04/23/2015 Elsevier Interactive Patient Education  2018 [employer]vier Inc[employer]     IF you received an x-ray today, you will receive an invoice from [facility]. Please contact [facility] at [phone] with questions or concerns regarding your invoice.   IF you received labwork today, you will receive an invoice from [facility]. Please contact [facility] at [phone] with questions or concerns regarding your invoice.   Our billing staff will not be   able to assist you with questions regarding bills from these companies.  You will be contacted with the lab results as soon as they are available. The fastest way to get your results is to activate your My Chart account. Instructions are located on the last page of this paperwork. If you have not heard from us regarding the results in 2 weeks, please contact this office.       

## 2017-10-21 NOTE — Progress Notes (Signed)
  Commercial Driver Medical Examination   Jack Thomas is a 42 y.o. male who presents today for a DOT physical exam. The patient reports history of pre-diabetes managed with diet only. Has history of HTN, HL, allergies, managed with medical therapy. Denies dizziness, chronic headache, blurred vision, chest pain, shortness of breath, heart racing, palpitations, nausea, vomiting, abdominal pain, hematuria, lower leg swelling. Denies smoking cigarettes, quit a few years ago.   The following portions of the patient's history were reviewed and updated as appropriate: allergies, current medications, past family history, past medical history, past social history and past surgical history.  Objective:   BP 134/83   Pulse 79   Temp 98.9 F (37.2 C) (Oral)   Resp 18   Ht 6' (1.829 m)   Wt (!) 398 lb 3.2 oz (180.6 kg)   SpO2 94%   BMI 54.01 kg/m   BP Readings from Last 3 Encounters:  10/21/17 134/83  09/10/17 139/84  08/11/17 (!) 141/95    Vision/hearing:  Visual Acuity Screening   Right eye Left eye Both eyes  Without correction:     With correction: 20/25 20/25-2 20/20-1  Comments: Peripheral Vision: Right eye 85 degrees. Left eye 85 degrees.  The patient can distinguish the colors red, amber and green.  Hearing Screening Comments: The patient was able to hear a forced whisper from 10 feet.  Patient can recognize and distinguish among traffic control signals and devices showing standard red, green, and amber colors.  Corrective lenses required: Yes  Monocular Vision?: No  Hearing aid requirement: No  Physical Exam  Constitutional: He is oriented to person, place, and time. He appears well-developed and well-nourished.  HENT:  TM's intact bilaterally, no effusions or erythema. Nasal turbinates pink and moist, nasal passages patent. No sinus tenderness. Oropharynx clear, mucous membranes moist, dentition in good repair.  Eyes: Pupils are equal, round, and reactive to light.  Conjunctivae and EOM are normal. Right eye exhibits no discharge. Left eye exhibits no discharge. No scleral icterus.  Neck: Normal range of motion. Neck supple.  Cardiovascular: Normal rate, regular rhythm and intact distal pulses. Exam reveals no gallop and no friction rub.  No murmur heard. Pulmonary/Chest: No stridor. No respiratory distress. He has no wheezes. He has no rales.  Abdominal: Soft. Bowel sounds are normal. He exhibits no distension and no mass. There is no tenderness.  Musculoskeletal: Normal range of motion. He exhibits no edema or tenderness.  Lymphadenopathy:    He has no cervical adenopathy.  Neurological: He is alert and oriented to person, place, and time. He has normal reflexes. He displays normal reflexes. Coordination normal.  Skin: Skin is warm and dry. No rash noted. No erythema. No pallor.  Psychiatric: He has a normal mood and affect.    Labs: Comments: Urine Specimen:  SpGr:  1.010   Blood:  Negative   Protein:  Negative   Sugar:  Negative  Assessment:    Healthy male exam.  Meets standards, but periodic monitoring required due to HTN, HL.  Driver qualified only for 1 year.    Plan:   Medical examiners certificate completed and printed. Return as needed.  Jaynee Eagles, PA-C [facility] [phone] 10/21/2017  11:21 AM

## 2017-11-25 ENCOUNTER — Ambulatory Visit: Payer: Managed Care, Other (non HMO) | Admitting: Medical

## 2017-12-02 ENCOUNTER — Encounter: Payer: Self-pay | Admitting: Family Medicine

## 2017-12-02 ENCOUNTER — Ambulatory Visit: Payer: Managed Care, Other (non HMO) | Admitting: Family Medicine

## 2017-12-02 VITALS — BP 152/106 | HR 74 | Temp 98.5°F | Resp 18 | Ht 72.0 in | Wt >= 6400 oz

## 2017-12-02 DIAGNOSIS — E119 Type 2 diabetes mellitus without complications: Secondary | ICD-10-CM | POA: Diagnosis not present

## 2017-12-02 DIAGNOSIS — E785 Hyperlipidemia, unspecified: Secondary | ICD-10-CM

## 2017-12-02 DIAGNOSIS — I10 Essential (primary) hypertension: Secondary | ICD-10-CM | POA: Diagnosis not present

## 2017-12-02 DIAGNOSIS — H60502 Unspecified acute noninfective otitis externa, left ear: Secondary | ICD-10-CM

## 2017-12-02 DIAGNOSIS — E1165 Type 2 diabetes mellitus with hyperglycemia: Secondary | ICD-10-CM | POA: Diagnosis not present

## 2017-12-02 DIAGNOSIS — J324 Chronic pansinusitis: Secondary | ICD-10-CM

## 2017-12-02 DIAGNOSIS — L603 Nail dystrophy: Secondary | ICD-10-CM | POA: Diagnosis not present

## 2017-12-02 LAB — THYROID PANEL WITH TSH
Free Thyroxine Index: 1.5 (ref 1.4–3.8)
T3 UPTAKE: 36 % — AB (ref 22–35)
T4, Total: 4.1 ug/dL — ABNORMAL LOW (ref 4.9–10.5)
TSH: 1.87 mIU/L (ref 0.40–4.50)

## 2017-12-02 MED ORDER — OFLOXACIN 0.3 % OT SOLN: 10.0000 [drp] | Freq: Every day | OTIC | 0 refills | Status: DC

## 2017-12-02 MED ORDER — TELMISARTAN 80 MG PO TABS: 80.0000 mg | ORAL_TABLET | Freq: Every day | ORAL | 3 refills | Status: DC

## 2017-12-02 MED ORDER — AMOXICILLIN-POT CLAVULANATE 875-125 MG PO TABS: 1.0000 | ORAL_TABLET | Freq: Two times a day (BID) | ORAL | 0 refills | Status: DC

## 2017-12-02 NOTE — Assessment & Plan Note (Signed)
Encouraged heart healthy diet, increase exercise, avoid trans fats, consider a krill oil cap daily 

## 2017-12-02 NOTE — Assessment & Plan Note (Signed)
abx per orders  con't singulair, claritin and flonase

## 2017-12-02 NOTE — Assessment & Plan Note (Signed)
floxin otic ent if no better

## 2017-12-02 NOTE — Assessment & Plan Note (Signed)
hgba1c to be checked, minimize simple carbs. Increase exercise as tolerated. Continue current meds  

## 2017-12-02 NOTE — Progress Notes (Signed)
Patient ID: Jack Thomas, male   DOB: [birth year], 42 y.o.   MRN: [id number]    Subjective:  I acted as a Education administrator for Dr. Carollee Thomas.  Jack Thomas, [place]   Patient ID: Jack Thomas, male    DOB: 01-Jan-1976, 42 y.o.   MRN: [id number]  Chief Complaint  Patient presents with  . Otalgia    left  . nail dryness    on fingernails and toenails.    Otalgia   There is pain in the left ear. This is a new problem. Episode onset: two weeks ago. Associated symptoms include ear discharge and rhinorrhea. Pertinent negatives include no coughing, headaches, rash or vomiting.    Patient is in today for left ear pain and dryness of toenails and fingernails.  He thinks that his dryness came from a medication that was given by his orthopedics.  HPI HYPERTENSION   Blood pressure range-not checking   Chest pain- no      Dyspnea- no Lightheadedness- no   Edema- no  Other side effects - no   Medication compliance: good Low salt diet- yes    DIABETES    Blood Sugar ranges-not checking   Polyuria- no New Visual problems- no  Hypoglycemic symptoms- no  Other side effects-no Medication compliance - good Last eye exam- due    HYPERLIPIDEMIA  Medication compliance- good RUQ pain- no  Muscle aches- no Other side effects-no   Patient Care Team: Jack Held, DO as PCP - General (Family Medicine)   Past Medical History:  Diagnosis Date  . Arthritis    right knee; left shoulder  . Diabetes mellitus without complication ([place])   . Hyperlipidemia   . Hypertension    takes metoprolol daily  . Sleep apnea    sleep study - recommended cpap    Past Surgical History:  Procedure Laterality Date  . KNEE SURGERY  2009   right  . NASAL SEPTOPLASTY W/ TURBINOPLASTY  12/03/2011   Procedure: NASAL SEPTOPLASTY WITH TURBINATE REDUCTION;  Surgeon: Izora Gala, MD;  Location: [facility];  Service: ENT;  Laterality: Bilateral;  . NASAL SINUS SURGERY  12/03/2011   Procedure: ENDOSCOPIC SINUS SURGERY;  Surgeon:  Izora Gala, MD;  Location: [facility];  Service: ENT;  Laterality: Bilateral;    Family History  Problem Relation Age of Onset  . Diabetes Mother   . Allergies Mother   . Hyperlipidemia Father   . Hypertension Father   . Diabetes Father   . Anesthesia problems Neg Hx   . Hypotension Neg Hx   . Malignant hyperthermia Neg Hx   . Pseudochol deficiency Neg Hx     Social History   Socioeconomic History  . Marital status: Single    Spouse name: Not on file  . Number of children: 0  . Years of education: Not on file  . Highest education level: Not on file  Occupational History  . Occupation: Contractor: [employer]  . Financial resource strain: Not on file  . Food insecurity:    Worry: Not on file    Inability: Not on file  . Transportation needs:    Medical: Not on file    Non-medical: Not on file  Tobacco Use  . Smoking status: Former Smoker    Packs/day: 0.50    Years: 6.00    Pack years: 3.00    Types: Cigars    Last attempt to quit: 11/02/2010    Years since quitting: 7.0  .  Smokeless tobacco: Never Used  Substance and Sexual Activity  . Alcohol use: No  . Drug use: No  . Sexual activity: Not on file  Lifestyle  . Physical activity:    Days per week: Not on file    Minutes per session: Not on file  . Stress: Not on file  Relationships  . Social connections:    Talks on phone: Not on file    Gets together: Not on file    Attends religious service: Not on file    Active member of club or organization: Not on file    Attends meetings of clubs or organizations: Not on file    Relationship status: Not on file  . Intimate partner violence:    Fear of current or ex partner: Not on file    Emotionally abused: Not on file    Physically abused: Not on file    Forced sexual activity: Not on file  Other Topics Concern  . Not on file  Social History Narrative  . Not on file    Outpatient Medications Prior to Visit  Medication Sig  Dispense Refill  . amLODipine (NORVASC) 10 MG tablet TAKE ONE TABLET BY MOUTH DAILY 90 tablet 1  . Diclofenac-Misoprostol 50-0.2 MG TBEC Take 1 tablet by mouth 3 (three) times daily between meals. 90 tablet 3  . fluticasone (FLONASE) 50 MCG/ACT nasal spray SPRAY TWO SPRAYS IN EACH NOSTRIL ONCE DAILY 16 g 2  . furosemide (LASIX) 20 MG tablet Take 1 tablet (20 mg total) by mouth daily. 90 tablet 3  . gabapentin (NEURONTIN) 300 MG capsule Take 1 capsule (300 mg total) by mouth at bedtime. 30 capsule 2  . glucose blood (ONETOUCH VERIO) test strip Check blood sugars once daily and as needed. 100 each 12  . loratadine (CLARITIN) 10 MG tablet Take 1 tablet (10 mg total) by mouth daily. 30 tablet 0  . metoprolol (TOPROL-XL) 200 MG 24 hr tablet Take 1 tablet (200 mg total) by mouth daily. 90 tablet 2  . montelukast (SINGULAIR) 10 MG tablet Take 1 tablet (10 mg total) by mouth at bedtime. 30 tablet 5  . ONETOUCH DELICA LANCETS 30Q MISC Check blood sugars once daily and as needed. 100 each 12  . rosuvastatin (CRESTOR) 20 MG tablet Take 1 tablet (20 mg total) daily by mouth. 30 tablet 2  . telmisartan (MICARDIS) 40 MG tablet TAKE ONE TABLET BY MOUTH DAILY 30 tablet 4   No facility-administered medications prior to visit.     No Known Allergies  Review of Systems  Constitutional: Negative for fever and malaise/fatigue.  HENT: Positive for congestion, ear discharge, ear pain and rhinorrhea.        Ear congestion   Eyes: Negative for blurred vision.  Respiratory: Negative for cough and shortness of breath.   Cardiovascular: Negative for chest pain, palpitations and leg swelling.  Gastrointestinal: Negative for vomiting.  Musculoskeletal: Negative for back pain.  Skin: Negative for rash.  Neurological: Negative for loss of consciousness and headaches.       Objective:    Physical Exam  Constitutional: He is oriented to person, place, and time. He appears well-developed and well-nourished.    HENT:  Right Ear: External ear normal. There is tenderness. Tympanic membrane is injected. A middle ear effusion is present.  Left Ear: Hearing, tympanic membrane, external ear and ear canal normal.  Nose: Right sinus exhibits maxillary sinus tenderness and frontal sinus tenderness. Left sinus exhibits maxillary sinus tenderness and frontal  sinus tenderness.  + PND + errythema  Eyes: Conjunctivae are normal. Right eye exhibits no discharge. Left eye exhibits no discharge.  Cardiovascular: Normal rate, regular rhythm and normal heart sounds.  No murmur heard. Pulmonary/Chest: Effort normal and breath sounds normal. No respiratory distress. He has no wheezes. He has no rales. He exhibits no tenderness.  Musculoskeletal: He exhibits no edema.  Lymphadenopathy:    He has cervical adenopathy.  Neurological: He is alert and oriented to person, place, and time.  Skin:     Nursing note and vitals reviewed.   BP (!) 152/106   Pulse 74   Temp 98.5 F (36.9 C) (Oral)   Resp 18   Ht 6' (1.829 m)   Wt (!) 401 lb 6.4 oz (182.1 kg)   SpO2 96%   BMI 54.44 kg/m  Wt Readings from Last 3 Encounters:  12/02/17 (!) 401 lb 6.4 oz (182.1 kg)  10/21/17 (!) 398 lb 3.2 oz (180.6 kg)  09/10/17 (!) 380 lb (172.4 kg)   BP Readings from Last 3 Encounters:  12/02/17 (!) 152/106  10/21/17 134/83  09/10/17 139/84     Immunization History  Administered Date(s) Administered  . Pneumococcal Polysaccharide-23 06/10/2017  . Td 10/16/2008  . Tdap 01/16/2013    Health Maintenance  Topic Date Due  . INFLUENZA VACCINE  06/03/2018 (Originally 03/03/2018)  . HEMOGLOBIN A1C  12/08/2017  . OPHTHALMOLOGY EXAM  03/18/2018  . FOOT EXAM  06/10/2018  . PNEUMOCOCCAL POLYSACCHARIDE VACCINE (2) 06/10/2022  . TETANUS/TDAP  01/17/2023  . HIV Screening  Completed    Lab Results  Component Value Date   WBC 11.4 (H) 06/10/2017   HGB 14.8 06/10/2017   HCT 45.5 06/10/2017   PLT 253.0 06/10/2017   GLUCOSE 122 (H)  06/10/2017   CHOL 213 (H) 06/10/2017   TRIG 250.0 (H) 06/10/2017   HDL 30.70 (L) 06/10/2017   LDLDIRECT 135.0 06/10/2017   LDLCALC 68 03/05/2017   ALT 20 06/10/2017   AST 23 06/10/2017   NA 138 06/10/2017   K 4.0 06/10/2017   CL 103 06/10/2017   CREATININE 0.94 06/10/2017   BUN 13 06/10/2017   CO2 28 06/10/2017   TSH 1.77 06/10/2017   INR 0.93 01/16/2013   HGBA1C 6.1 06/10/2017   MICROALBUR 1.2 06/10/2017    Lab Results  Component Value Date   TSH 1.77 06/10/2017   Lab Results  Component Value Date   WBC 11.4 (H) 06/10/2017   HGB 14.8 06/10/2017   HCT 45.5 06/10/2017   MCV 97.7 06/10/2017   PLT 253.0 06/10/2017   Lab Results  Component Value Date   NA 138 06/10/2017   K 4.0 06/10/2017   CO2 28 06/10/2017   GLUCOSE 122 (H) 06/10/2017   BUN 13 06/10/2017   CREATININE 0.94 06/10/2017   BILITOT 0.6 06/10/2017   ALKPHOS 63 06/10/2017   AST 23 06/10/2017   ALT 20 06/10/2017   PROT 7.7 06/10/2017   ALBUMIN 4.2 06/10/2017   CALCIUM 9.8 06/10/2017   GFR 114.80 06/10/2017   Lab Results  Component Value Date   CHOL 213 (H) 06/10/2017   Lab Results  Component Value Date   HDL 30.70 (L) 06/10/2017   Lab Results  Component Value Date   LDLCALC 68 03/05/2017   Lab Results  Component Value Date   TRIG 250.0 (H) 06/10/2017   Lab Results  Component Value Date   CHOLHDL 7 06/10/2017   Lab Results  Component Value Date   HGBA1C 6.1  06/10/2017         Assessment & Plan:   Problem List Items Addressed This Visit      Unprioritized   Acute otitis externa of left ear    floxin otic ent if no better      Relevant Medications   ofloxacin (FLOXIN OTIC) 0.3 % OTIC solution   amoxicillin-clavulanate (AUGMENTIN) 875-125 MG tablet   Essential hypertension - Primary   Relevant Medications   telmisartan (MICARDIS) 80 MG tablet   Other Relevant Orders   Lipid panel   Hemoglobin A1c   CBC with Differential/Platelet   Comprehensive metabolic panel    Pansinusitis    abx per orders  con't singulair, claritin and flonase       Relevant Medications   amoxicillin-clavulanate (AUGMENTIN) 875-125 MG tablet    Other Visit Diagnoses    Brittle nails       Relevant Orders   Thyroid Panel With TSH   Diet-controlled diabetes mellitus (HCC)       Relevant Medications   telmisartan (MICARDIS) 80 MG tablet   Other Relevant Orders   Hemoglobin A1c   Comprehensive metabolic panel   Hyperlipidemia LDL goal <70       Relevant Medications   telmisartan (MICARDIS) 80 MG tablet   Other Relevant Orders   Lipid panel   Comprehensive metabolic panel      I have discontinued Kamerin Kopke's telmisartan. I am also having him start on telmisartan, ofloxacin, and amoxicillin-clavulanate. Additionally, I am having him maintain his glucose blood, ONETOUCH DELICA LANCETS 85I, loratadine, furosemide, metoprolol, montelukast, amLODipine, rosuvastatin, gabapentin, fluticasone, and Diclofenac-miSOPROStol.  Meds ordered this encounter  Medications  . telmisartan (MICARDIS) 80 MG tablet    Sig: Take 1 tablet (80 mg total) by mouth daily.    Dispense:  90 tablet    Refill:  3  . ofloxacin (FLOXIN OTIC) 0.3 % OTIC solution    Sig: Place 10 drops into the left ear daily.    Dispense:  5 mL    Refill:  0  . amoxicillin-clavulanate (AUGMENTIN) 875-125 MG tablet    Sig: Take 1 tablet by mouth 2 (two) times daily.    Dispense:  20 tablet    Refill:  0    CMA served as scribe during this visit. History, Physical and Plan performed by medical provider. Documentation and orders reviewed and attested to.  Jack Held, DO

## 2017-12-02 NOTE — Patient Instructions (Signed)
Otitis Media, Adult Otitis media is redness, soreness, and puffiness (swelling) in the space just behind your eardrum (middle ear). It may be caused by allergies or infection. It often happens along with a cold. Follow these instructions at home:  Take your medicine as told. Finish it even if you start to feel better.  Only take over-the-counter or prescription medicines for pain, discomfort, or fever as told by your doctor.  Follow up with your doctor as told. Contact a doctor if:  You have otitis media only in one ear, or bleeding from your nose, or both.  You notice a lump on your neck.  You are not getting better in 3-5 days.  You feel worse instead of better. Get help right away if:  You have pain that is not helped with medicine.  You have puffiness, redness, or pain around your ear.  You get a stiff neck.  You cannot move part of your face (paralysis).  You notice that the bone behind your ear hurts when you touch it. This information is not intended to replace advice given to you by your health care provider. Make sure you discuss any questions you have with your health care provider. Document Released: 01/06/2008 Document Revised: 12/26/2015 Document Reviewed: 02/14/2013 Elsevier Interactive Patient Education  2017 Elsevier Inc.  

## 2017-12-02 NOTE — Assessment & Plan Note (Signed)
Poorly controlled will alter medications, encouraged DASH diet, minimize caffeine and obtain adequate sleep. Report concerning symptoms and follow up as directed and as needed 

## 2017-12-03 ENCOUNTER — Telehealth: Payer: Self-pay | Admitting: *Deleted

## 2017-12-03 LAB — LIPID PANEL
Cholesterol: 200 mg/dL (ref 0–200)
HDL: 35.3 mg/dL — ABNORMAL LOW (ref >39.00)
NONHDL: 164.99
Total CHOL/HDL Ratio: 6
Triglycerides: 291 mg/dL — ABNORMAL HIGH (ref 0.0–149.0)
VLDL: 58.2 mg/dL — ABNORMAL HIGH (ref 0.0–40.0)

## 2017-12-03 LAB — COMPREHENSIVE METABOLIC PANEL
ALBUMIN: 4.1 g/dL (ref 3.5–5.2)
ALK PHOS: 62 U/L (ref 39–117)
ALT: 20 U/L (ref 0–53)
AST: 24 U/L (ref 0–37)
BILIRUBIN TOTAL: 0.4 mg/dL (ref 0.2–1.2)
BUN: 12 mg/dL (ref 6–23)
CALCIUM: 9.3 mg/dL (ref 8.4–10.5)
CO2: 27 mEq/L (ref 19–32)
Chloride: 104 mEq/L (ref 96–112)
Creatinine, Ser: 0.86 mg/dL (ref 0.40–1.50)
GFR: 126.89 mL/min (ref >60.00)
GLUCOSE: 121 mg/dL — AB (ref 70–99)
Potassium: 4.1 mEq/L (ref 3.5–5.1)
Sodium: 139 mEq/L (ref 135–145)
TOTAL PROTEIN: 7.4 g/dL (ref 6.0–8.3)

## 2017-12-03 LAB — CBC WITH DIFFERENTIAL/PLATELET
BASOS ABS: 0.2 10*3/uL — AB (ref 0.0–0.1)
Basophils Relative: 1.7 % (ref 0.0–3.0)
Eosinophils Absolute: 0.3 10*3/uL (ref 0.0–0.7)
Eosinophils Relative: 2.4 % (ref 0.0–5.0)
HEMATOCRIT: 42.7 % (ref 39.0–52.0)
HEMOGLOBIN: 14.3 g/dL (ref 13.0–17.0)
LYMPHS PCT: 30.9 % (ref 12.0–46.0)
Lymphs Abs: 3.4 10*3/uL (ref 0.7–4.0)
MCHC: 33.4 g/dL (ref 30.0–36.0)
MCV: 95.5 fl (ref 78.0–100.0)
MONOS PCT: 7.7 % (ref 3.0–12.0)
Monocytes Absolute: 0.8 10*3/uL (ref 0.1–1.0)
NEUTROS PCT: 57.3 % (ref 43.0–77.0)
Neutro Abs: 6.2 10*3/uL (ref 1.4–7.7)
Platelets: 249 10*3/uL (ref 150.0–400.0)
RBC: 4.47 Mil/uL (ref 4.22–5.81)
RDW: 12.3 % (ref 11.5–15.5)
WBC: 10.9 10*3/uL — ABNORMAL HIGH (ref 4.0–10.5)

## 2017-12-03 LAB — HEMOGLOBIN A1C: HEMOGLOBIN A1C: 6.7 % — AB (ref 4.6–6.5)

## 2017-12-03 LAB — LDL CHOLESTEROL, DIRECT: Direct LDL: 122 mg/dL

## 2017-12-03 NOTE — Telephone Encounter (Signed)
Cipro otic or ophthalmic can work for ear. TY.

## 2017-12-03 NOTE — Telephone Encounter (Signed)
You want to use same sig?  10 drops daily.

## 2017-12-03 NOTE — Telephone Encounter (Signed)
4 drops per affected ear twice daily.

## 2017-12-03 NOTE — Telephone Encounter (Signed)
Patient was seen yesterday and today we received a fax from Jack Thomas stating that oflaxacin (Floxin) is currently unavailable.  Can we change to something else?

## 2017-12-07 MED ORDER — CIPROFLOXACIN-DEXAMETHASONE 0.3-0.1 % OT SUSP: 4.0000 [drp] | Freq: Two times a day (BID) | OTIC | 0 refills | Status: DC

## 2017-12-07 NOTE — Telephone Encounter (Signed)
Patient notified and verbalized understanding. 

## 2017-12-07 NOTE — Addendum Note (Signed)
Addended by: Wynonia Musty A on: 12/07/2017 10:18 AM   Modules accepted: Orders

## 2017-12-09 ENCOUNTER — Ambulatory Visit: Payer: Managed Care, Other (non HMO) | Admitting: Family Medicine

## 2017-12-13 ENCOUNTER — Telehealth: Payer: Self-pay

## 2017-12-13 DIAGNOSIS — E785 Hyperlipidemia, unspecified: Secondary | ICD-10-CM

## 2017-12-13 MED ORDER — FENOFIBRATE 160 MG PO TABS
160.0000 mg | ORAL_TABLET | Freq: Every day | ORAL | 1 refills | Status: DC
Start: 2017-12-13 — End: 2018-05-02

## 2017-12-13 NOTE — Telephone Encounter (Signed)
-----   Message from Ann Held, DO sent at 12/07/2017  1:19 PM EDT ----- Cholesterol--- LDL goal < 70,  HDL >40,  TG < 150.  Diet and exercise will increase HDL and decrease LDL and TG.  Fish,  Fish Oil, Flaxseed oil will also help increase the HDL and decrease Triglycerides.   Recheck labs in 3 months--- add fenofibrate 160 mg daily to crestor Lipid, cmp.

## 2017-12-16 ENCOUNTER — Ambulatory Visit: Payer: Managed Care, Other (non HMO) | Admitting: Family Medicine

## 2017-12-23 ENCOUNTER — Ambulatory Visit (INDEPENDENT_AMBULATORY_CARE_PROVIDER_SITE_OTHER): Payer: Managed Care, Other (non HMO) | Admitting: Family Medicine

## 2017-12-23 ENCOUNTER — Encounter: Payer: Self-pay | Admitting: Family Medicine

## 2017-12-23 VITALS — BP 122/86 | HR 76 | Temp 98.4°F | Resp 16 | Ht 72.0 in | Wt 397.4 lb

## 2017-12-23 DIAGNOSIS — E785 Hyperlipidemia, unspecified: Secondary | ICD-10-CM | POA: Diagnosis not present

## 2017-12-23 DIAGNOSIS — E1165 Type 2 diabetes mellitus with hyperglycemia: Secondary | ICD-10-CM | POA: Diagnosis not present

## 2017-12-23 DIAGNOSIS — I10 Essential (primary) hypertension: Secondary | ICD-10-CM

## 2017-12-23 NOTE — Progress Notes (Signed)
Patient ID: Jack Thomas, male    DOB: [birth year]  Age: 42 y.o. MRN: [id number]    Subjective:  Subjective  HPI [name] presents for bp and f/u  Ears . Pt is doing much better.  No complaints  Review of Systems  Constitutional: Negative.   HENT: Negative for congestion, ear pain, hearing loss, nosebleeds, postnasal drip, rhinorrhea, sinus pressure, sneezing and tinnitus.   Eyes: Negative for photophobia, discharge, itching and visual disturbance.  Respiratory: Negative.   Cardiovascular: Negative.   Gastrointestinal: Negative for abdominal distention, abdominal pain, anal bleeding, blood in stool and constipation.  Endocrine: Negative.   Genitourinary: Negative.   Musculoskeletal: Negative.   Skin: Negative.   Allergic/Immunologic: Negative.   Neurological: Negative for dizziness, weakness, light-headedness, numbness and headaches.  Psychiatric/Behavioral: Negative for agitation, confusion, decreased concentration, dysphoric mood, sleep disturbance and suicidal ideas. The patient is not nervous/anxious.     History Past Medical History:  Diagnosis Date  . Arthritis    right knee; left shoulder  . Diabetes mellitus without complication ([place])   . Hyperlipidemia   . Hypertension    takes metoprolol daily  . Sleep apnea    sleep study - recommended cpap    He has a past surgical history that includes Knee surgery (2009); Nasal sinus surgery (12/03/2011); and Nasal septoplasty w/ turbinoplasty (12/03/2011).   His family history includes Allergies in his mother; Diabetes in his father and mother; Hyperlipidemia in his father; Hypertension in his father.He reports that he quit smoking about 7 years ago. His smoking use included cigars. He has a 3.00 pack-year smoking history. He has never used smokeless tobacco. He reports that he does not drink alcohol or use drugs.  Current Outpatient Medications on File Prior to Visit  Medication Sig Dispense Refill  . amLODipine (NORVASC) 10  MG tablet TAKE ONE TABLET BY MOUTH DAILY 90 tablet 1  . Diclofenac-Misoprostol 50-0.2 MG TBEC Take 1 tablet by mouth 3 (three) times daily between meals. 90 tablet 3  . fenofibrate 160 MG tablet Take 1 tablet (160 mg total) by mouth daily. 30 tablet 1  . fluticasone (FLONASE) 50 MCG/ACT nasal spray SPRAY TWO SPRAYS IN EACH NOSTRIL ONCE DAILY 16 g 2  . furosemide (LASIX) 20 MG tablet Take 1 tablet (20 mg total) by mouth daily. 90 tablet 3  . gabapentin (NEURONTIN) 300 MG capsule Take 1 capsule (300 mg total) by mouth at bedtime. 30 capsule 2  . glucose blood (ONETOUCH VERIO) test strip Check blood sugars once daily and as needed. 100 each 12  . loratadine (CLARITIN) 10 MG tablet Take 1 tablet (10 mg total) by mouth daily. 30 tablet 0  . metoprolol (TOPROL-XL) 200 MG 24 hr tablet Take 1 tablet (200 mg total) by mouth daily. 90 tablet 2  . montelukast (SINGULAIR) 10 MG tablet Take 1 tablet (10 mg total) by mouth at bedtime. 30 tablet 5  . ONETOUCH DELICA LANCETS 42V MISC Check blood sugars once daily and as needed. 100 each 12  . rosuvastatin (CRESTOR) 20 MG tablet Take 1 tablet (20 mg total) daily by mouth. 30 tablet 2  . telmisartan (MICARDIS) 80 MG tablet Take 1 tablet (80 mg total) by mouth daily. 90 tablet 3   No current facility-administered medications on file prior to visit.      Objective:  Objective  Physical Exam  Constitutional: He is oriented to person, place, and time. Vital signs are normal. He appears well-developed and well-nourished. He is sleeping.  HENT:  Head: Normocephalic and atraumatic.  Mouth/Throat: Oropharynx is clear and moist.  Eyes: Pupils are equal, round, and reactive to light. EOM are normal.  Neck: Normal range of motion. Neck supple. No thyromegaly present.  Cardiovascular: Normal rate and regular rhythm.  No murmur heard. Pulmonary/Chest: Effort normal and breath sounds normal. No respiratory distress. He has no wheezes. He has no rales. He exhibits no  tenderness.  Musculoskeletal: He exhibits no edema or tenderness.  Neurological: He is alert and oriented to person, place, and time.  Skin: Skin is warm and dry.  Psychiatric: He has a normal mood and affect. His behavior is normal. Judgment and thought content normal.  Nursing note and vitals reviewed.  BP 122/86 (BP Location: Right Arm, Cuff Size: Large)   Pulse 76   Temp 98.4 F (36.9 C) (Oral)   Resp 16   Ht 6' (1.829 m)   Wt (!) 397 lb 6.4 oz (180.3 kg)   SpO2 98%   BMI 53.90 kg/m  Wt Readings from Last 3 Encounters:  12/23/17 (!) 397 lb 6.4 oz (180.3 kg)  12/02/17 (!) 401 lb 6.4 oz (182.1 kg)  10/21/17 (!) 398 lb 3.2 oz (180.6 kg)     Lab Results  Component Value Date   WBC 10.9 (H) 12/02/2017   HGB 14.3 12/02/2017   HCT 42.7 12/02/2017   PLT 249.0 12/02/2017   GLUCOSE 121 (H) 12/02/2017   CHOL 200 12/02/2017   TRIG 291.0 (H) 12/02/2017   HDL 35.30 (L) 12/02/2017   LDLDIRECT 122.0 12/02/2017   LDLCALC 68 03/05/2017   ALT 20 12/02/2017   AST 24 12/02/2017   NA 139 12/02/2017   K 4.1 12/02/2017   CL 104 12/02/2017   CREATININE 0.86 12/02/2017   BUN 12 12/02/2017   CO2 27 12/02/2017   TSH 1.87 12/02/2017   INR 0.93 01/16/2013   HGBA1C 6.7 (H) 12/02/2017   MICROALBUR 1.2 06/10/2017    Ct Thoracic Spine Wo Contrast  Result Date: 08/27/2017 CLINICAL DATA:  Lumbar spine pain.  No injury.  Suspect fracture. EXAM: CT THORACIC SPINE WITHOUT CONTRAST TECHNIQUE: Multidetector CT images of the thoracic were obtained using the standard protocol without intravenous contrast. COMPARISON:  Chest two-view 12/15/2016 FINDINGS: Alignment: Normal Vertebrae: Negative for thoracic fracture or mass Paraspinal and other soft tissues: No soft tissue swelling or edema. Visualized lungs clear. Disc levels: Mild thoracic disc degeneration with mild disc space narrowing and very mild spurring. No significant spinal stenosis. Canal evaluation limited due to patient size and artifact.  IMPRESSION: Negative for fracture. Mild thoracic degenerative changes. No acute skeletal abnormality. Electronically Signed   By: Franchot Gallo M.D.   On: 08/27/2017 13:10   Ct Lumbar Spine Wo Contrast  Result Date: 08/27/2017 CLINICAL DATA:  Back pain.  Left hip pain.  No injury. EXAM: CT LUMBAR SPINE WITHOUT CONTRAST TECHNIQUE: Multidetector CT imaging of the lumbar spine was performed without intravenous contrast administration. Multiplanar CT image reconstructions were also generated. COMPARISON:  Lumbar radiographs 06/22/2017 FINDINGS: Segmentation: Normal Alignment: Normal Vertebrae: Negative for fracture or mass.  No bone lesion. Paraspinal and other soft tissues: Negative for mass or adenopathy. Disc levels: L1-2: Negative L2-3: Negative L3-4: Mild disc bulging and mild facet degeneration. Mild spinal stenosis L4-5: Diffuse disc bulging and endplate spurring and moderate facet hypertrophy. Mild spinal stenosis. L5-S1: Moderate disc degeneration with disc bulging and diffuse endplate spurring. Moderate facet hypertrophy bilaterally. Severe foraminal encroachment bilaterally with impingement of the L5 nerve root bilaterally. Subarticular stenosis  with likely S1 impingement bilaterally. IMPRESSION: Negative for fracture Mild spinal stenosis L3-4 and L4-5 Disc degeneration and spondylosis and facet degeneration L5-S1 causing subarticular and marked foraminal encroachment bilaterally. Electronically Signed   By: Franchot Gallo M.D.   On: 08/27/2017 13:15     Assessment & Plan:  Plan  I have discontinued Blessed Bewley's ofloxacin, amoxicillin-clavulanate, and ciprofloxacin-dexamethasone. I am also having him maintain his glucose blood, ONETOUCH DELICA LANCETS 08U, loratadine, furosemide, metoprolol, montelukast, amLODipine, rosuvastatin, gabapentin, fluticasone, Diclofenac-miSOPROStol, telmisartan, and fenofibrate.  No orders of the defined types were placed in this encounter.   Problem List Items  Addressed This Visit      Unprioritized   Essential hypertension - Primary   Relevant Orders   Lipid panel   Comprehensive metabolic panel    Other Visit Diagnoses    Hyperlipidemia LDL goal <100       Relevant Orders   Lipid panel   Comprehensive metabolic panel      Follow-up: Return in about 3 months (around 03/25/2018), or lab visit.  Ann Held, DO

## 2017-12-23 NOTE — Assessment & Plan Note (Signed)
Tolerating statin, encouraged heart healthy diet, avoid trans fats, minimize simple carbs and saturated fats. Increase exercise as tolerated 

## 2017-12-23 NOTE — Patient Instructions (Signed)
DASH Eating Plan DASH stands for "Dietary Approaches to Stop Hypertension." The DASH eating plan is a healthy eating plan that has been shown to reduce high blood pressure (hypertension). It may also reduce your risk for type 2 diabetes, heart disease, and stroke. The DASH eating plan may also help with weight loss. What are tips for following this plan? General guidelines  Avoid eating more than 2,300 mg (milligrams) of salt (sodium) a day. If you have hypertension, you may need to reduce your sodium intake to 1,500 mg a day.  Limit alcohol intake to no more than 1 drink a day for nonpregnant women and 2 drinks a day for men. One drink equals 12 oz of beer, 5 oz of wine, or 1 oz of hard liquor.  Work with your health care provider to maintain a healthy body weight or to lose weight. Ask what an ideal weight is for you.  Get at least 30 minutes of exercise that causes your heart to beat faster (aerobic exercise) most days of the week. Activities may include walking, swimming, or biking.  Work with your health care provider or diet and nutrition specialist (dietitian) to adjust your eating plan to your individual calorie needs. Reading food labels  Check food labels for the amount of sodium per serving. Choose foods with less than 5 percent of the Daily Value of sodium. Generally, foods with less than 300 mg of sodium per serving fit into this eating plan.  To find whole grains, look for the word "whole" as the first word in the ingredient list. Shopping  Buy products labeled as "low-sodium" or "no salt added."  Buy fresh foods. Avoid canned foods and premade or frozen meals. Cooking  Avoid adding salt when cooking. Use salt-free seasonings or herbs instead of table salt or sea salt. Check with your health care provider or pharmacist before using salt substitutes.  Do not fry foods. Cook foods using healthy methods such as baking, boiling, grilling, and broiling instead.  Cook with  heart-healthy oils, such as olive, canola, soybean, or sunflower oil. Meal planning   Eat a balanced diet that includes: ? 5 or more servings of fruits and vegetables each day. At each meal, try to fill half of your plate with fruits and vegetables. ? Up to 6-8 servings of whole grains each day. ? Less than 6 oz of lean meat, poultry, or fish each day. A 3-oz serving of meat is about the same size as a deck of cards. One egg equals 1 oz. ? 2 servings of low-fat dairy each day. ? A serving of nuts, seeds, or beans 5 times each week. ? Heart-healthy fats. Healthy fats called Omega-3 fatty acids are found in foods such as flaxseeds and coldwater fish, like sardines, salmon, and mackerel.  Limit how much you eat of the following: ? Canned or prepackaged foods. ? Food that is high in trans fat, such as fried foods. ? Food that is high in saturated fat, such as fatty meat. ? Sweets, desserts, sugary drinks, and other foods with added sugar. ? Full-fat dairy products.  Do not salt foods before eating.  Try to eat at least 2 vegetarian meals each week.  Eat more home-cooked food and less restaurant, buffet, and fast food.  When eating at a restaurant, ask that your food be prepared with less salt or no salt, if possible. What foods are recommended? The items listed may not be a complete list. Talk with your dietitian about what   dietary choices are best for you. Grains Whole-grain or whole-wheat bread. Whole-grain or whole-wheat pasta. Brown rice. Oatmeal. Quinoa. Bulgur. Whole-grain and low-sodium cereals. Pita bread. Low-fat, low-sodium crackers. Whole-wheat flour tortillas. Vegetables Fresh or frozen vegetables (raw, steamed, roasted, or grilled). Low-sodium or reduced-sodium tomato and vegetable juice. Low-sodium or reduced-sodium tomato sauce and tomato paste. Low-sodium or reduced-sodium canned vegetables. Fruits All fresh, dried, or frozen fruit. Canned fruit in natural juice (without  added sugar). Meat and other protein foods Skinless chicken or turkey. Ground chicken or turkey. Pork with fat trimmed off. Fish and seafood. Egg whites. Dried beans, peas, or lentils. Unsalted nuts, nut butters, and seeds. Unsalted canned beans. Lean cuts of beef with fat trimmed off. Low-sodium, lean deli meat. Dairy Low-fat (1%) or fat-free (skim) milk. Fat-free, low-fat, or reduced-fat cheeses. Nonfat, low-sodium ricotta or cottage cheese. Low-fat or nonfat yogurt. Low-fat, low-sodium cheese. Fats and oils Soft margarine without trans fats. Vegetable oil. Low-fat, reduced-fat, or light mayonnaise and salad dressings (reduced-sodium). Canola, safflower, olive, soybean, and sunflower oils. Avocado. Seasoning and other foods Herbs. Spices. Seasoning mixes without salt. Unsalted popcorn and pretzels. Fat-free sweets. What foods are not recommended? The items listed may not be a complete list. Talk with your dietitian about what dietary choices are best for you. Grains Baked goods made with fat, such as croissants, muffins, or some breads. Dry pasta or rice meal packs. Vegetables Creamed or fried vegetables. Vegetables in a cheese sauce. Regular canned vegetables (not low-sodium or reduced-sodium). Regular canned tomato sauce and paste (not low-sodium or reduced-sodium). Regular tomato and vegetable juice (not low-sodium or reduced-sodium). Pickles. Olives. Fruits Canned fruit in a light or heavy syrup. Fried fruit. Fruit in cream or butter sauce. Meat and other protein foods Fatty cuts of meat. Ribs. Fried meat. Bacon. Sausage. Bologna and other processed lunch meats. Salami. Fatback. Hotdogs. Bratwurst. Salted nuts and seeds. Canned beans with added salt. Canned or smoked fish. Whole eggs or egg yolks. Chicken or turkey with skin. Dairy Whole or 2% milk, cream, and half-and-half. Whole or full-fat cream cheese. Whole-fat or sweetened yogurt. Full-fat cheese. Nondairy creamers. Whipped toppings.  Processed cheese and cheese spreads. Fats and oils Butter. Stick margarine. Lard. Shortening. Ghee. Bacon fat. Tropical oils, such as coconut, palm kernel, or palm oil. Seasoning and other foods Salted popcorn and pretzels. Onion salt, garlic salt, seasoned salt, table salt, and sea salt. Worcestershire sauce. Tartar sauce. Barbecue sauce. Teriyaki sauce. Soy sauce, including reduced-sodium. Steak sauce. Canned and packaged gravies. Fish sauce. Oyster sauce. Cocktail sauce. Horseradish that you find on the shelf. Ketchup. Mustard. Meat flavorings and tenderizers. Bouillon cubes. Hot sauce and Tabasco sauce. Premade or packaged marinades. Premade or packaged taco seasonings. Relishes. Regular salad dressings. Where to find more information:  National Heart, Lung, and Blood Institute: www.nhlbi.nih.gov  American Heart Association: www.heart.org Summary  The DASH eating plan is a healthy eating plan that has been shown to reduce high blood pressure (hypertension). It may also reduce your risk for type 2 diabetes, heart disease, and stroke.  With the DASH eating plan, you should limit salt (sodium) intake to 2,300 mg a day. If you have hypertension, you may need to reduce your sodium intake to 1,500 mg a day.  When on the DASH eating plan, aim to eat more fresh fruits and vegetables, whole grains, lean proteins, low-fat dairy, and heart-healthy fats.  Work with your health care provider or diet and nutrition specialist (dietitian) to adjust your eating plan to your individual   calorie needs. This information is not intended to replace advice given to you by your health care provider. Make sure you discuss any questions you have with your health care provider. Document Released: 07/09/2011 Document Revised: 07/13/2016 Document Reviewed: 07/13/2016 Elsevier Interactive Patient Education  2018 [employer]  

## 2017-12-23 NOTE — Assessment & Plan Note (Signed)
Well controlled, no changes to meds. Encouraged heart healthy diet such as the DASH diet and exercise as tolerated.  °

## 2017-12-23 NOTE — Assessment & Plan Note (Signed)
Check labs 

## 2018-01-05 ENCOUNTER — Other Ambulatory Visit: Payer: Self-pay | Admitting: Family Medicine

## 2018-01-14 ENCOUNTER — Encounter: Payer: Self-pay | Admitting: Family Medicine

## 2018-01-17 ENCOUNTER — Telehealth: Payer: Self-pay

## 2018-01-17 ENCOUNTER — Other Ambulatory Visit: Payer: Self-pay | Admitting: Family Medicine

## 2018-01-17 DIAGNOSIS — H6503 Acute serous otitis media, bilateral: Secondary | ICD-10-CM

## 2018-01-17 MED ORDER — AMOXICILLIN-POT CLAVULANATE 875-125 MG PO TABS: 1.0000 | ORAL_TABLET | Freq: Two times a day (BID) | ORAL | 0 refills | Status: DC

## 2018-01-17 NOTE — Telephone Encounter (Signed)
abx sent in--- needs ov to check end of this week or next

## 2018-01-17 NOTE — Telephone Encounter (Signed)
Pt. Requesting "ear drop med" as "infection is back". No otic rx found. Send to Dr. Etter Sjogren to advise.

## 2018-01-17 NOTE — Telephone Encounter (Signed)
Author phoned pt. to relay Dr. Nonda Lou message. Pt. stated he was looking for ear drops, but pt. Made aware of Dr's preference for po medication at this time, and need to make an appointment. Pt. Made an appointment for 6/20 at 4PM with Dr. Etter Sjogren.

## 2018-01-20 ENCOUNTER — Ambulatory Visit (INDEPENDENT_AMBULATORY_CARE_PROVIDER_SITE_OTHER): Payer: Managed Care, Other (non HMO) | Admitting: Family Medicine

## 2018-01-20 ENCOUNTER — Encounter: Payer: Self-pay | Admitting: Family Medicine

## 2018-01-20 VITALS — BP 128/82 | HR 78 | Temp 98.4°F | Resp 18 | Ht 72.05 in | Wt >= 6400 oz

## 2018-01-20 DIAGNOSIS — H6505 Acute serous otitis media, recurrent, left ear: Secondary | ICD-10-CM | POA: Diagnosis not present

## 2018-01-20 MED ORDER — OFLOXACIN 0.3 % OT SOLN
10.0000 [drp] | Freq: Every day | OTIC | 0 refills | Status: DC
Start: 2018-01-20 — End: 2018-06-06

## 2018-01-20 NOTE — Progress Notes (Signed)
Subjective:  I acted as a Education administrator for [employer]. Yancey Flemings, [place]   Patient ID: Jack Thomas, male    DOB: January 03, 1976, 42 y.o.   MRN: [id number]  Chief Complaint  Patient presents with  . Ear Pain    HPI  Patient is in today for follow up on left ear pain.  No other complaints.    Patient Care Team: Carollee Herter, Alferd Apa, DO as PCP - General (Family Medicine)   Past Medical History:  Diagnosis Date  . Arthritis    right knee; left shoulder  . Diabetes mellitus without complication ([place])   . Hyperlipidemia   . Hypertension    takes metoprolol daily  . Sleep apnea    sleep study - recommended cpap    Past Surgical History:  Procedure Laterality Date  . KNEE SURGERY  2009   right  . NASAL SEPTOPLASTY W/ TURBINOPLASTY  12/03/2011   Procedure: NASAL SEPTOPLASTY WITH TURBINATE REDUCTION;  Surgeon: Izora Gala, MD;  Location: [facility];  Service: ENT;  Laterality: Bilateral;  . NASAL SINUS SURGERY  12/03/2011   Procedure: ENDOSCOPIC SINUS SURGERY;  Surgeon: Izora Gala, MD;  Location: [facility];  Service: ENT;  Laterality: Bilateral;    Family History  Problem Relation Age of Onset  . Diabetes Mother   . Allergies Mother   . Hyperlipidemia Father   . Hypertension Father   . Diabetes Father   . Anesthesia problems Neg Hx   . Hypotension Neg Hx   . Malignant hyperthermia Neg Hx   . Pseudochol deficiency Neg Hx     Social History   Socioeconomic History  . Marital status: Single    Spouse name: Not on file  . Number of children: 0  . Years of education: Not on file  . Highest education level: Not on file  Occupational History  . Occupation: Contractor: [employer]  . Financial resource strain: Not on file  . Food insecurity:    Worry: Not on file    Inability: Not on file  . Transportation needs:    Medical: Not on file    Non-medical: Not on file  Tobacco Use  . Smoking status: Former Smoker    Packs/day: 0.50    Years: 6.00   Pack years: 3.00    Types: Cigars    Last attempt to quit: 11/02/2010    Years since quitting: 7.2  . Smokeless tobacco: Never Used  Substance and Sexual Activity  . Alcohol use: No  . Drug use: No  . Sexual activity: Not on file  Lifestyle  . Physical activity:    Days per week: Not on file    Minutes per session: Not on file  . Stress: Not on file  Relationships  . Social connections:    Talks on phone: Not on file    Gets together: Not on file    Attends religious service: Not on file    Active member of club or organization: Not on file    Attends meetings of clubs or organizations: Not on file    Relationship status: Not on file  . Intimate partner violence:    Fear of current or ex partner: Not on file    Emotionally abused: Not on file    Physically abused: Not on file    Forced sexual activity: Not on file  Other Topics Concern  . Not on file  Social History Narrative  . Not on  file    Outpatient Medications Prior to Visit  Medication Sig Dispense Refill  . amLODipine (NORVASC) 10 MG tablet TAKE ONE TABLET BY MOUTH DAILY 90 tablet 1  . amoxicillin-clavulanate (AUGMENTIN) 875-125 MG tablet Take 1 tablet by mouth 2 (two) times daily. 20 tablet 0  . Diclofenac-Misoprostol 50-0.2 MG TBEC Take 1 tablet by mouth 3 (three) times daily between meals. 90 tablet 3  . fenofibrate 160 MG tablet Take 1 tablet (160 mg total) by mouth daily. 30 tablet 1  . fluticasone (FLONASE) 50 MCG/ACT nasal spray SPRAY TWO SPRAYS IN EACH NOSTRIL ONCE DAILY 16 g 2  . furosemide (LASIX) 20 MG tablet Take 1 tablet (20 mg total) by mouth daily. 90 tablet 3  . gabapentin (NEURONTIN) 300 MG capsule Take 1 capsule (300 mg total) by mouth at bedtime. 30 capsule 2  . glucose blood (ONETOUCH VERIO) test strip Check blood sugars once daily and as needed. 100 each 12  . loratadine (CLARITIN) 10 MG tablet Take 1 tablet (10 mg total) by mouth daily. 30 tablet 0  . metoprolol (TOPROL-XL) 200 MG 24 hr tablet  Take 1 tablet (200 mg total) by mouth daily. 90 tablet 2  . metoprolol (TOPROL-XL) 200 MG 24 hr tablet TAKE 1 TABLET BY MOUTH DAILY 90 tablet 0  . montelukast (SINGULAIR) 10 MG tablet Take 1 tablet (10 mg total) by mouth at bedtime. 30 tablet 5  . ONETOUCH DELICA LANCETS 43X MISC Check blood sugars once daily and as needed. 100 each 12  . rosuvastatin (CRESTOR) 20 MG tablet Take 1 tablet (20 mg total) daily by mouth. 30 tablet 2  . telmisartan (MICARDIS) 80 MG tablet Take 1 tablet (80 mg total) by mouth daily. 90 tablet 3   No facility-administered medications prior to visit.     No Known Allergies  Review of Systems  Constitutional: Negative for chills, fever and malaise/fatigue.  HENT: Negative for congestion and hearing loss.   Eyes: Negative for discharge.  Respiratory: Negative for cough, sputum production and shortness of breath.   Cardiovascular: Negative for chest pain, palpitations and leg swelling.  Gastrointestinal: Negative for abdominal pain, blood in stool, constipation, diarrhea, heartburn, nausea and vomiting.  Genitourinary: Negative for dysuria, frequency, hematuria and urgency.  Musculoskeletal: Negative for back pain, falls and myalgias.  Skin: Negative for rash.  Neurological: Negative for dizziness, sensory change, loss of consciousness, weakness and headaches.  Endo/Heme/Allergies: Negative for environmental allergies. Does not bruise/bleed easily.  Psychiatric/Behavioral: Negative for depression and suicidal ideas. The patient is not nervous/anxious and does not have insomnia.        Objective:    Physical Exam  Constitutional: He is oriented to person, place, and time. Vital signs are normal. He appears well-developed and well-nourished. He is sleeping.  HENT:  Head: Normocephalic and atraumatic.  Left Ear: There is swelling. Tympanic membrane is injected and erythematous. Tympanic membrane is not retracted. A middle ear effusion is present. Decreased  hearing is noted.  Mouth/Throat: Oropharynx is clear and moist.  Eyes: Pupils are equal, round, and reactive to light. EOM are normal.  Neck: Normal range of motion. Neck supple. No thyromegaly present.  Cardiovascular: Normal rate and regular rhythm.  No murmur heard. Pulmonary/Chest: Effort normal and breath sounds normal. No respiratory distress. He has no wheezes. He has no rales. He exhibits no tenderness.  Musculoskeletal: He exhibits no edema or tenderness.  Neurological: He is alert and oriented to person, place, and time.  Skin: Skin is warm  and dry.  Psychiatric: He has a normal mood and affect. His behavior is normal. Judgment and thought content normal.  Nursing note and vitals reviewed.   BP 128/82 (BP Location: Left Arm, Patient Position: Sitting, Cuff Size: Large)   Pulse 78   Temp 98.4 F (36.9 C) (Oral)   Resp 18   Ht 6' 0.05" (1.83 m)   Wt (!) 402 lb 9.6 oz (182.6 kg)   SpO2 96%   BMI 54.53 kg/m  Wt Readings from Last 3 Encounters:  01/20/18 (!) 402 lb 9.6 oz (182.6 kg)  12/23/17 (!) 397 lb 6.4 oz (180.3 kg)  12/02/17 (!) 401 lb 6.4 oz (182.1 kg)   BP Readings from Last 3 Encounters:  01/20/18 128/82  12/23/17 122/86  12/02/17 (!) 152/106     Immunization History  Administered Date(s) Administered  . Pneumococcal Polysaccharide-23 06/10/2017  . Td 10/16/2008  . Tdap 01/16/2013    Health Maintenance  Topic Date Due  . INFLUENZA VACCINE  06/03/2018 (Originally 03/03/2018)  . OPHTHALMOLOGY EXAM  03/18/2018  . HEMOGLOBIN A1C  06/04/2018  . FOOT EXAM  06/10/2018  . PNEUMOCOCCAL POLYSACCHARIDE VACCINE (2) 06/10/2022  . TETANUS/TDAP  01/17/2023  . HIV Screening  Completed    Lab Results  Component Value Date   WBC 10.9 (H) 12/02/2017   HGB 14.3 12/02/2017   HCT 42.7 12/02/2017   PLT 249.0 12/02/2017   GLUCOSE 121 (H) 12/02/2017   CHOL 200 12/02/2017   TRIG 291.0 (H) 12/02/2017   HDL 35.30 (L) 12/02/2017   LDLDIRECT 122.0 12/02/2017   LDLCALC 68  03/05/2017   ALT 20 12/02/2017   AST 24 12/02/2017   NA 139 12/02/2017   K 4.1 12/02/2017   CL 104 12/02/2017   CREATININE 0.86 12/02/2017   BUN 12 12/02/2017   CO2 27 12/02/2017   TSH 1.87 12/02/2017   INR 0.93 01/16/2013   HGBA1C 6.7 (H) 12/02/2017   MICROALBUR 1.2 06/10/2017    Lab Results  Component Value Date   TSH 1.87 12/02/2017   Lab Results  Component Value Date   WBC 10.9 (H) 12/02/2017   HGB 14.3 12/02/2017   HCT 42.7 12/02/2017   MCV 95.5 12/02/2017   PLT 249.0 12/02/2017   Lab Results  Component Value Date   NA 139 12/02/2017   K 4.1 12/02/2017   CO2 27 12/02/2017   GLUCOSE 121 (H) 12/02/2017   BUN 12 12/02/2017   CREATININE 0.86 12/02/2017   BILITOT 0.4 12/02/2017   ALKPHOS 62 12/02/2017   AST 24 12/02/2017   ALT 20 12/02/2017   PROT 7.4 12/02/2017   ALBUMIN 4.1 12/02/2017   CALCIUM 9.3 12/02/2017   GFR 126.89 12/02/2017   Lab Results  Component Value Date   CHOL 200 12/02/2017   Lab Results  Component Value Date   HDL 35.30 (L) 12/02/2017   Lab Results  Component Value Date   LDLCALC 68 03/05/2017   Lab Results  Component Value Date   TRIG 291.0 (H) 12/02/2017   Lab Results  Component Value Date   CHOLHDL 6 12/02/2017   Lab Results  Component Value Date   HGBA1C 6.7 (H) 12/02/2017         Assessment & Plan:   Problem List Items Addressed This Visit    None    Visit Diagnoses    Recurrent acute serous otitis media of left ear    -  Primary   Relevant Medications   ofloxacin (FLOXIN OTIC) 0.3 % OTIC  solution   Other Relevant Orders   Ambulatory referral to ENT    finish augmentin   I am having Tyson Alias start on ofloxacin. I am also having him maintain his glucose blood, ONETOUCH DELICA LANCETS 12O, loratadine, furosemide, metoprolol, montelukast, amLODipine, rosuvastatin, gabapentin, fluticasone, Diclofenac-miSOPROStol, telmisartan, fenofibrate, metoprolol, and amoxicillin-clavulanate.  Meds ordered this  encounter  Medications  . ofloxacin (FLOXIN OTIC) 0.3 % OTIC solution    Sig: Place 10 drops into the left ear daily.    Dispense:  5 mL    Refill:  0    CMA served as scribe during this visit. History, Physical and Plan performed by medical provider. Documentation and orders reviewed and attested to.  Ann Held, DO

## 2018-01-20 NOTE — Patient Instructions (Signed)

## 2018-03-07 ENCOUNTER — Other Ambulatory Visit: Payer: Self-pay | Admitting: Family Medicine

## 2018-03-07 DIAGNOSIS — J301 Allergic rhinitis due to pollen: Secondary | ICD-10-CM

## 2018-03-20 ENCOUNTER — Other Ambulatory Visit: Payer: Self-pay | Admitting: Family Medicine

## 2018-03-20 DIAGNOSIS — R600 Localized edema: Secondary | ICD-10-CM

## 2018-03-26 IMAGING — CR DG CHEST 2V
2 series · 2 of 2 positions shown · non-contrast
Comparison: None.

CLINICAL DATA: Cough and body aches for 3 days

EXAM:
CHEST  2 VIEW

[w chest pa]
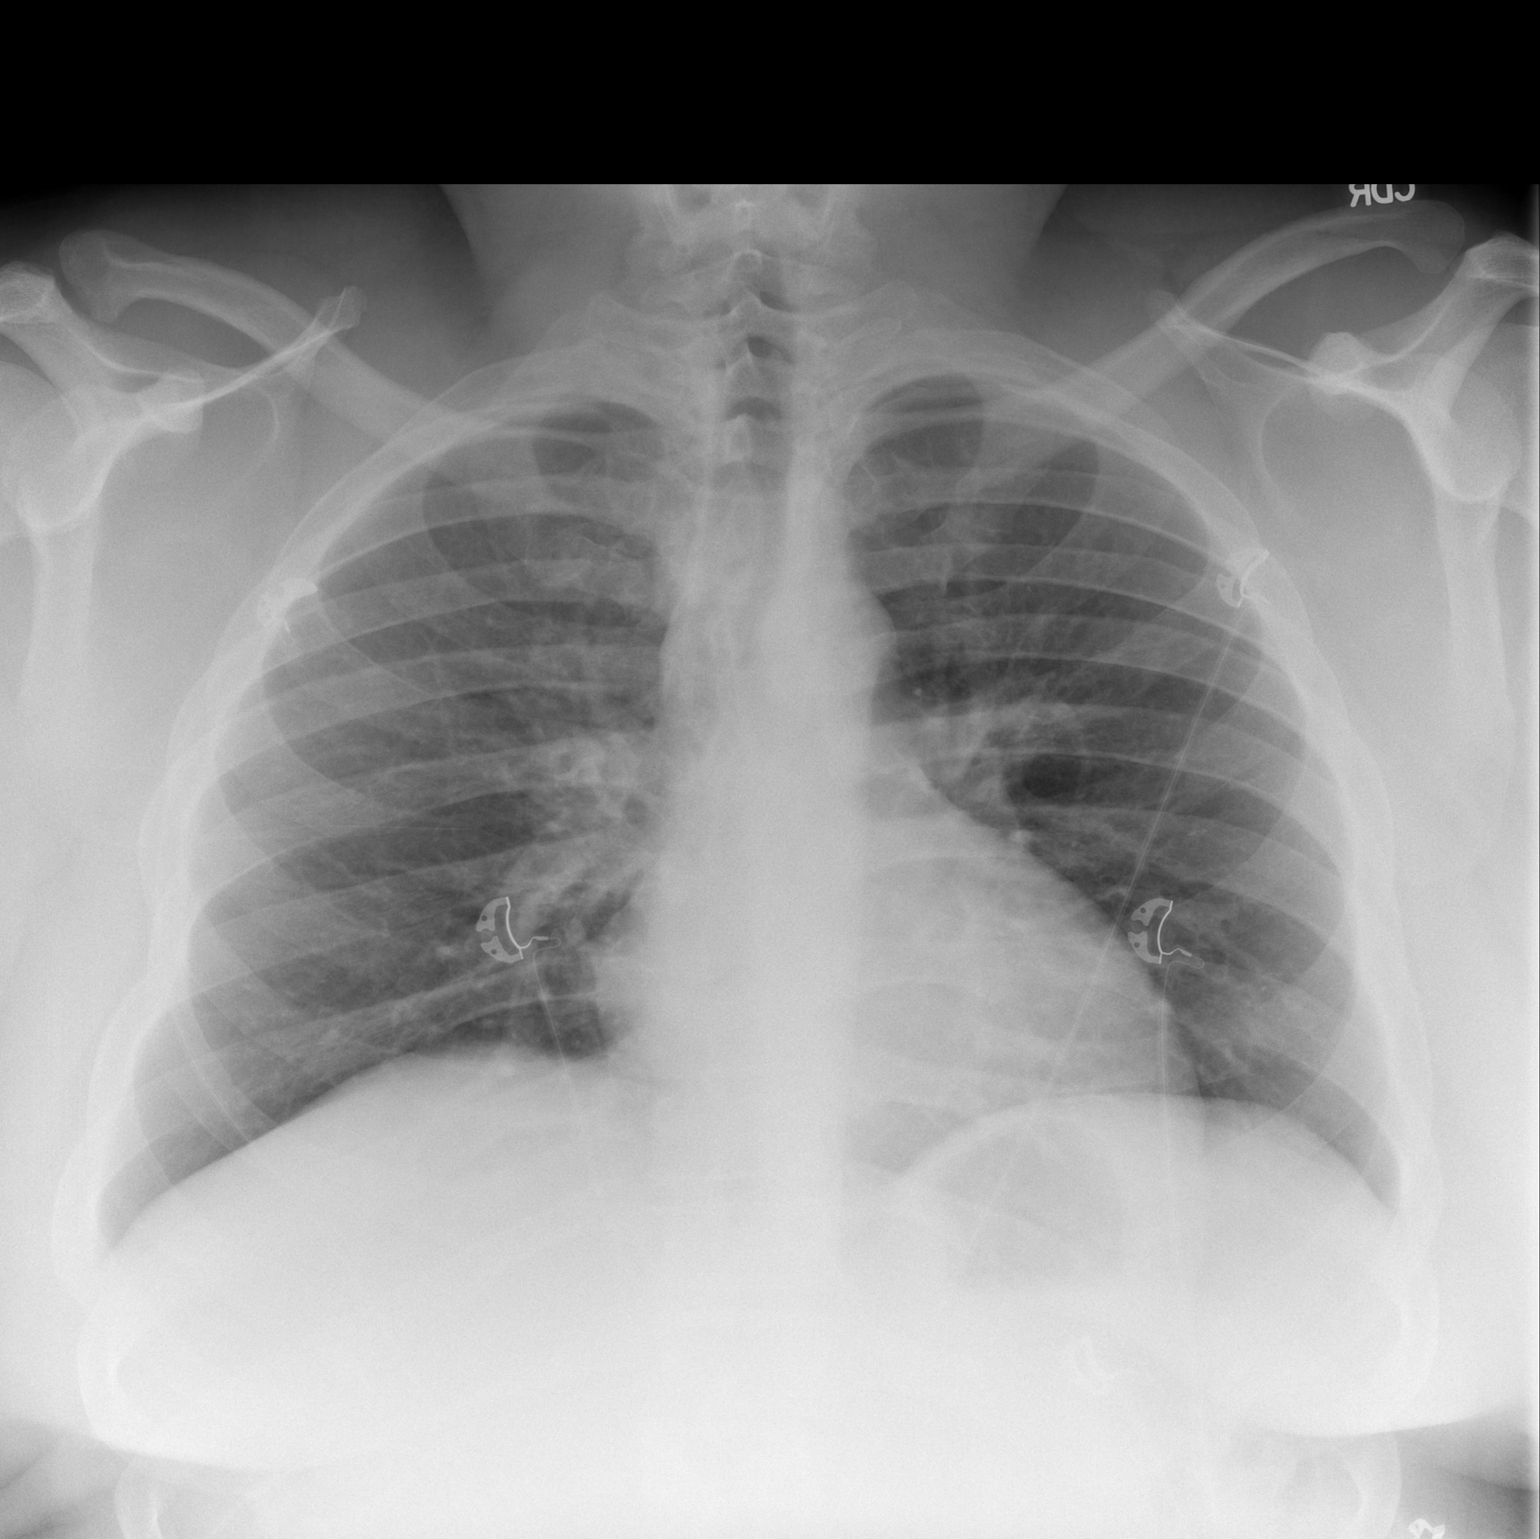

[w chest lat]
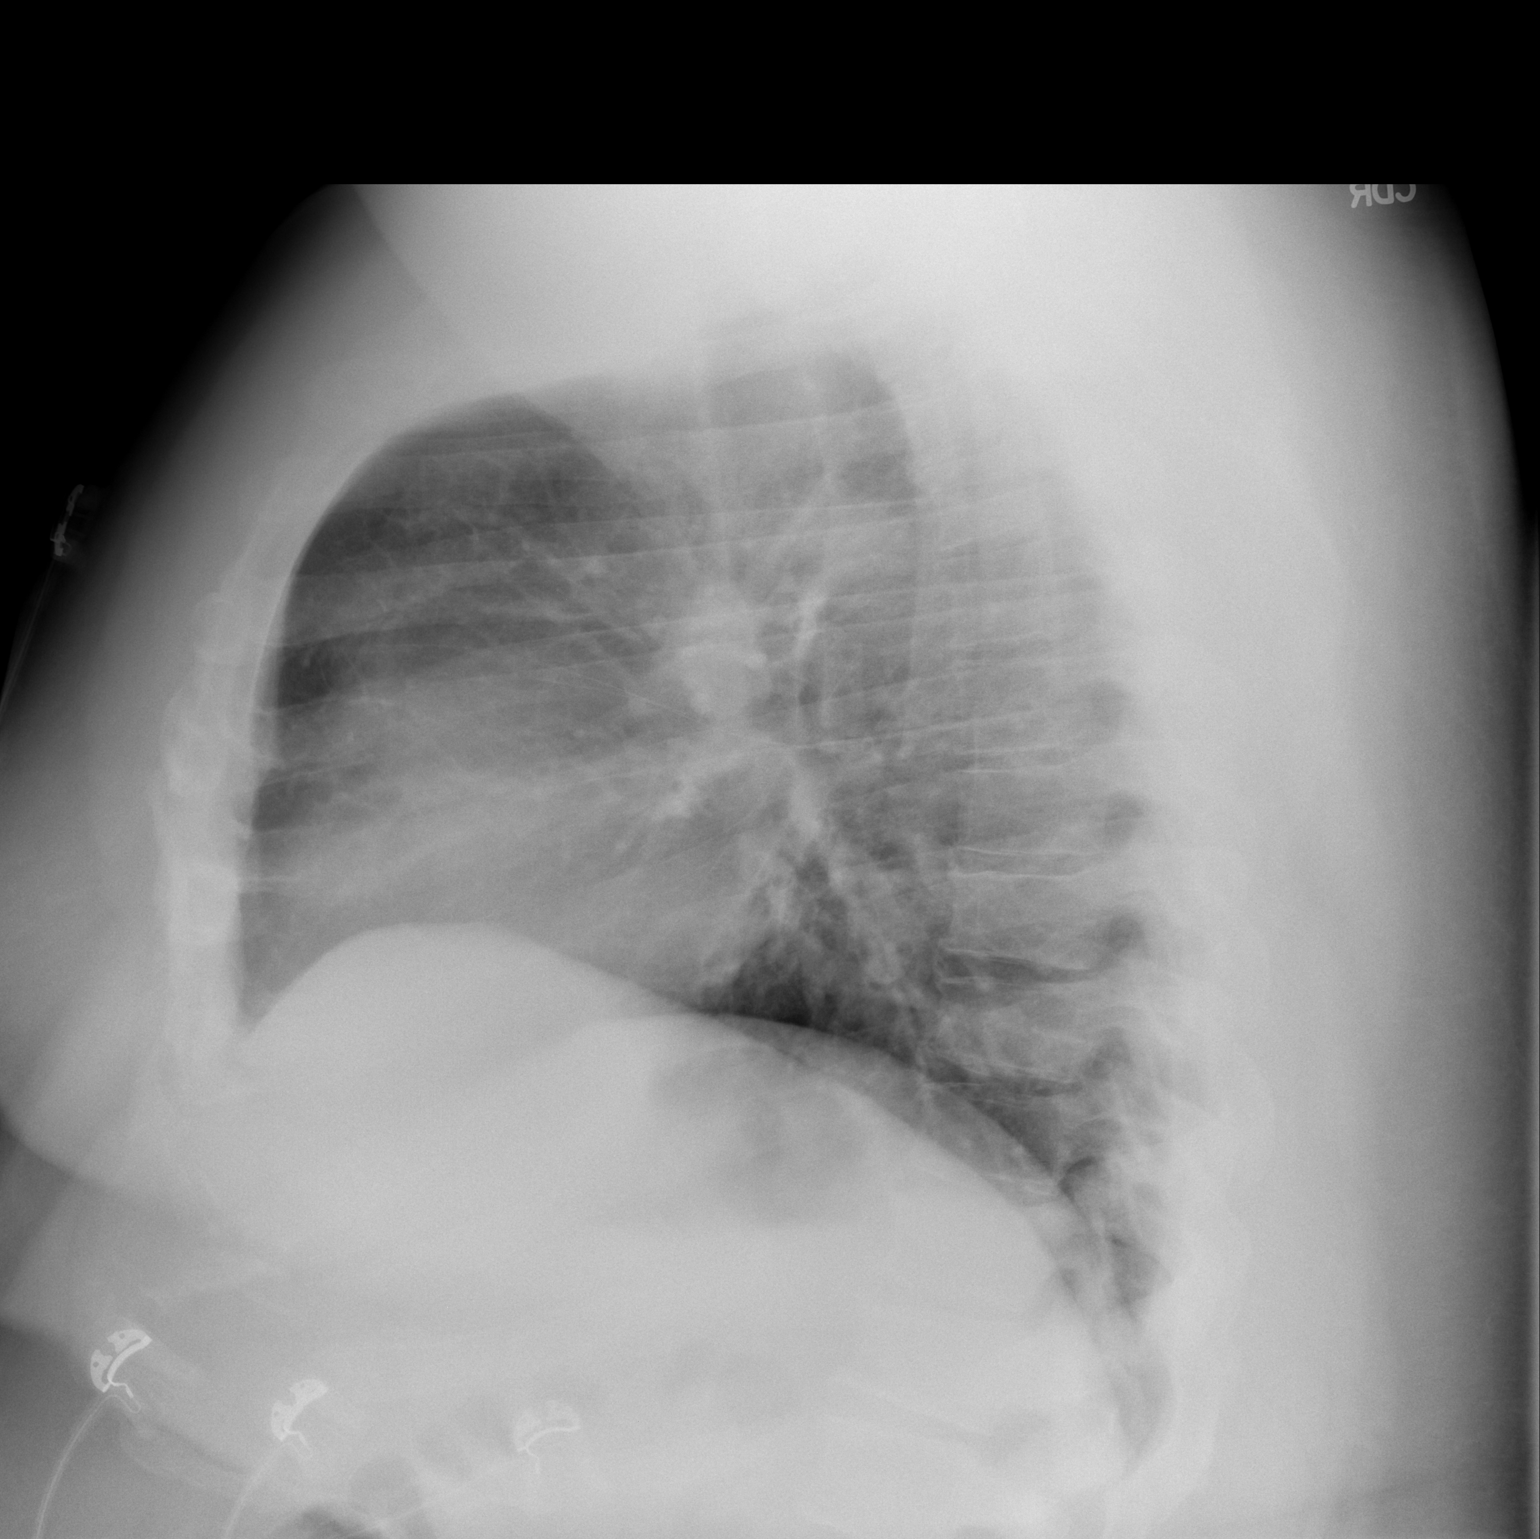

[2 of 2 positions shown; findings below may reference images not displayed]

FINDINGS: The heart size and mediastinal contours are within normal limits.
Both lungs are clear. The visualized skeletal structures are
unremarkable.
IMPRESSION: No active cardiopulmonary disease.

## 2018-03-31 ENCOUNTER — Ambulatory Visit: Payer: Managed Care, Other (non HMO) | Admitting: Family Medicine

## 2018-05-01 ENCOUNTER — Emergency Department (HOSPITAL_BASED_OUTPATIENT_CLINIC_OR_DEPARTMENT_OTHER)
Admission: EM | Admit: 2018-05-01 | Discharge: 2018-05-01 | Disposition: A | Discharge status: Home / Self Care | Payer: Managed Care, Other (non HMO) | Attending: Emergency Medicine | Admitting: Emergency Medicine

## 2018-05-01 ENCOUNTER — Emergency Department (HOSPITAL_BASED_OUTPATIENT_CLINIC_OR_DEPARTMENT_OTHER): Payer: Managed Care, Other (non HMO)

## 2018-05-01 ENCOUNTER — Other Ambulatory Visit: Payer: Self-pay

## 2018-05-01 ENCOUNTER — Encounter (HOSPITAL_BASED_OUTPATIENT_CLINIC_OR_DEPARTMENT_OTHER): Payer: Self-pay | Admitting: Emergency Medicine

## 2018-05-01 DIAGNOSIS — S99921A Unspecified injury of right foot, initial encounter: Secondary | ICD-10-CM | POA: Diagnosis present

## 2018-05-01 DIAGNOSIS — E119 Type 2 diabetes mellitus without complications: Secondary | ICD-10-CM | POA: Diagnosis not present

## 2018-05-01 DIAGNOSIS — I10 Essential (primary) hypertension: Secondary | ICD-10-CM | POA: Diagnosis not present

## 2018-05-01 DIAGNOSIS — Z87891 Personal history of nicotine dependence: Secondary | ICD-10-CM | POA: Diagnosis not present

## 2018-05-01 DIAGNOSIS — S92424A Nondisplaced fracture of distal phalanx of right great toe, initial encounter for closed fracture: Secondary | ICD-10-CM | POA: Diagnosis not present

## 2018-05-01 DIAGNOSIS — Y939 Activity, unspecified: Secondary | ICD-10-CM | POA: Diagnosis not present

## 2018-05-01 DIAGNOSIS — Z79899 Other long term (current) drug therapy: Secondary | ICD-10-CM | POA: Diagnosis not present

## 2018-05-01 DIAGNOSIS — X58XXXA Exposure to other specified factors, initial encounter: Secondary | ICD-10-CM | POA: Diagnosis not present

## 2018-05-01 DIAGNOSIS — Y929 Unspecified place or not applicable: Secondary | ICD-10-CM | POA: Diagnosis not present

## 2018-05-01 DIAGNOSIS — Y999 Unspecified external cause status: Secondary | ICD-10-CM | POA: Diagnosis not present
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
## 2018-05-01 NOTE — Discharge Instructions (Addendum)
Take ibuprofen and Tylenol for pain  Elevate your foot as needed

## 2018-05-02 ENCOUNTER — Other Ambulatory Visit: Payer: Self-pay | Admitting: Family Medicine

## 2018-05-02 DIAGNOSIS — E785 Hyperlipidemia, unspecified: Secondary | ICD-10-CM

## 2018-05-22 ENCOUNTER — Other Ambulatory Visit: Payer: Self-pay | Admitting: Family Medicine

## 2018-05-22 DIAGNOSIS — I10 Essential (primary) hypertension: Secondary | ICD-10-CM

## 2018-06-06 ENCOUNTER — Other Ambulatory Visit: Payer: Self-pay | Admitting: Family Medicine

## 2018-06-06 ENCOUNTER — Encounter: Payer: Self-pay | Admitting: Family Medicine

## 2018-06-06 DIAGNOSIS — H6505 Acute serous otitis media, recurrent, left ear: Secondary | ICD-10-CM

## 2018-06-06 MED ORDER — OFLOXACIN 0.3 % OT SOLN: 10.0000 [drp] | Freq: Every day | OTIC | 0 refills | Status: DC

## 2018-06-06 NOTE — Telephone Encounter (Signed)
I have sent in the drops He will need ov if drops do not work

## 2018-07-16 ENCOUNTER — Other Ambulatory Visit: Payer: Self-pay | Admitting: Family Medicine

## 2018-07-16 DIAGNOSIS — E785 Hyperlipidemia, unspecified: Secondary | ICD-10-CM

## 2018-07-20 MED ORDER — METOPROLOL SUCCINATE ER 200 MG PO TB24: 200.0000 mg | ORAL_TABLET | Freq: Every day | ORAL | 1 refills | Status: DC

## 2018-07-20 MED ORDER — FENOFIBRATE 160 MG PO TABS: 160.0000 mg | ORAL_TABLET | Freq: Every day | ORAL | 1 refills | Status: DC

## 2018-09-26 ENCOUNTER — Other Ambulatory Visit: Payer: Self-pay | Admitting: Family Medicine

## 2018-09-26 DIAGNOSIS — R600 Localized edema: Secondary | ICD-10-CM

## 2018-10-04 ENCOUNTER — Other Ambulatory Visit: Payer: Self-pay | Admitting: Family Medicine

## 2018-10-04 DIAGNOSIS — R600 Localized edema: Secondary | ICD-10-CM

## 2018-11-24 ENCOUNTER — Other Ambulatory Visit: Payer: Self-pay | Admitting: Family Medicine

## 2018-11-24 DIAGNOSIS — I10 Essential (primary) hypertension: Secondary | ICD-10-CM

## 2019-01-08 ENCOUNTER — Other Ambulatory Visit: Payer: Self-pay | Admitting: Family Medicine

## 2019-01-08 DIAGNOSIS — J301 Allergic rhinitis due to pollen: Secondary | ICD-10-CM

## 2019-01-11 ENCOUNTER — Other Ambulatory Visit: Payer: Self-pay | Admitting: Family Medicine

## 2019-01-11 DIAGNOSIS — I10 Essential (primary) hypertension: Secondary | ICD-10-CM

## 2019-01-11 MED ORDER — TELMISARTAN 80 MG PO TABS: 80.0000 mg | ORAL_TABLET | Freq: Every day | ORAL | 0 refills | Status: DC

## 2019-01-11 NOTE — Telephone Encounter (Signed)
Rx sent 

## 2019-01-11 NOTE — Telephone Encounter (Signed)
Copied from [place] [id number]. Topic: Quick Communication - Rx Refill/Question >> Jan 11, 2019 11:15 AM Alanda Slim E wrote: Medication: telmisartan (MICARDIS) 80 MG tablet - should be reduced to 40MG   Has the patient contacted their pharmacy? No   Preferred Pharmacy (with phone number or street name): [facility] [address] [place], [place] - [address] [phone] (Phone) [phone] (Fax)    Agent: Please be advised that RX refills may take up to 3 business days. We ask that you follow-up with your pharmacy.

## 2019-01-13 ENCOUNTER — Telehealth: Payer: Self-pay | Admitting: *Deleted

## 2019-01-13 NOTE — Telephone Encounter (Signed)
Left message on machine to call back to make virtual appointment for follow up htn and hyperlipidemia

## 2019-02-23 ENCOUNTER — Other Ambulatory Visit: Payer: Self-pay | Admitting: Family Medicine

## 2019-02-23 DIAGNOSIS — J301 Allergic rhinitis due to pollen: Secondary | ICD-10-CM

## 2019-02-28 ENCOUNTER — Other Ambulatory Visit: Payer: Self-pay | Admitting: *Deleted

## 2019-02-28 DIAGNOSIS — J0110 Acute frontal sinusitis, unspecified: Secondary | ICD-10-CM

## 2019-02-28 MED ORDER — FLUTICASONE PROPIONATE 50 MCG/ACT NA SUSP: NASAL | 0 refills | Status: DC

## 2019-03-06 ENCOUNTER — Other Ambulatory Visit: Payer: Self-pay | Admitting: Family Medicine

## 2019-03-06 DIAGNOSIS — I10 Essential (primary) hypertension: Secondary | ICD-10-CM

## 2019-03-06 DIAGNOSIS — R600 Localized edema: Secondary | ICD-10-CM

## 2019-03-15 ENCOUNTER — Other Ambulatory Visit: Payer: Self-pay | Admitting: Family Medicine

## 2019-03-15 DIAGNOSIS — J301 Allergic rhinitis due to pollen: Secondary | ICD-10-CM

## 2019-04-26 ENCOUNTER — Other Ambulatory Visit: Payer: Self-pay | Admitting: Family Medicine

## 2019-04-26 DIAGNOSIS — J301 Allergic rhinitis due to pollen: Secondary | ICD-10-CM

## 2019-04-26 DIAGNOSIS — I10 Essential (primary) hypertension: Secondary | ICD-10-CM

## 2019-05-02 ENCOUNTER — Encounter: Payer: Self-pay | Admitting: Family Medicine

## 2019-05-03 ENCOUNTER — Ambulatory Visit (INDEPENDENT_AMBULATORY_CARE_PROVIDER_SITE_OTHER): Payer: Managed Care, Other (non HMO) | Admitting: Family Medicine

## 2019-05-03 ENCOUNTER — Encounter: Payer: Self-pay | Admitting: Family Medicine

## 2019-05-03 ENCOUNTER — Other Ambulatory Visit: Payer: Self-pay

## 2019-05-03 DIAGNOSIS — E1169 Type 2 diabetes mellitus with other specified complication: Secondary | ICD-10-CM | POA: Diagnosis not present

## 2019-05-03 DIAGNOSIS — I10 Essential (primary) hypertension: Secondary | ICD-10-CM

## 2019-05-03 DIAGNOSIS — J0110 Acute frontal sinusitis, unspecified: Secondary | ICD-10-CM | POA: Diagnosis not present

## 2019-05-03 DIAGNOSIS — E1165 Type 2 diabetes mellitus with hyperglycemia: Secondary | ICD-10-CM

## 2019-05-03 DIAGNOSIS — E785 Hyperlipidemia, unspecified: Secondary | ICD-10-CM

## 2019-05-03 MED ORDER — LEVOCETIRIZINE DIHYDROCHLORIDE 5 MG PO TABS: 5.0000 mg | ORAL_TABLET | Freq: Every evening | ORAL | 5 refills | Status: DC

## 2019-05-03 MED ORDER — AMOXICILLIN-POT CLAVULANATE 875-125 MG PO TABS: 1.0000 | ORAL_TABLET | Freq: Two times a day (BID) | ORAL | 0 refills | Status: DC

## 2019-05-03 MED ORDER — FLUTICASONE PROPIONATE 50 MCG/ACT NA SUSP: NASAL | 5 refills | Status: DC

## 2019-05-03 NOTE — Progress Notes (Signed)
Virtual Visit via Video Note  I connected with Tyson Alias on 05/03/19 at 11:40 AM EDT by a video enabled telemedicine application and verified that I am speaking with the correct person using two identifiers.  Location: Patient: work  Provider: home    I discussed the limitations of evaluation and management by telemedicine and the availability of in person appointments. The patient expressed understanding and agreed to proceed.  History of Present Illness: Pt c/o sinus congestion x 2 weeks , no fevers  + cough , productive     He was taking mucinex D and a "sinus pill ?     Congestion is keeping him from sleeping    Observations/Objective: No vitals obtained--- pt is at work  Pt is in nad   Assessment and Plan: 1. Uncontrolled type 2 diabetes mellitus with hyperglycemia ([place]) Check labs  hgba1c to be checked , minimize simple carbs. Increase exercise as tolerated. Continue current meds  - Microalbumin / creatinine urine ratio; Future - Lipid panel; Future - Hemoglobin A1c; Future - Comprehensive metabolic panel; Future  2. Hyperlipidemia associated with type 2 diabetes mellitus ([place]) Tolerating statin, encouraged heart healthy diet, avoid trans fats, minimize simple carbs and saturated fats. Increase exercise as tolerated - Microalbumin / creatinine urine ratio; Future - Lipid panel; Future - Hemoglobin A1c; Future - Comprehensive metabolic panel; Future  3. Essential hypertension Well controlled, no changes to meds. Encouraged heart healthy diet such as the DASH diet and exercise as tolerated. --- pt could not check bp today  - Microalbumin / creatinine urine ratio; Future - Lipid panel; Future - Hemoglobin A1c; Future - Comprehensive metabolic panel; Future  4. Acute frontal sinusitis, recurrence not specified meds per orders ----- call back prn  - fluticasone (FLONASE) 50 MCG/ACT nasal spray; SPRAY TWO SPRAYS IN EACH NOSTRIL ONCE DAILY.  Needs ov before any more  refills.  Dispense: 16 g; Refill: 5 - levocetirizine (XYZAL) 5 MG tablet; Take 1 tablet (5 mg total) by mouth every evening.  Dispense: 30 tablet; Refill: 5 - amoxicillin-clavulanate (AUGMENTIN) 875-125 MG tablet; Take 1 tablet by mouth 2 (two) times daily.  Dispense: 20 tablet; Refill: 0   Follow Up Instructions:    I discussed the assessment and treatment plan with the patient. The patient was provided an opportunity to ask questions and all were answered. The patient agreed with the plan and demonstrated an understanding of the instructions.   The patient was advised to call back or seek an in-person evaluation if the symptoms worsen or if the condition fails to improve as anticipated.  I provided 15 minutes of non-face-to-face time during this encounter.   Ann Held, DO

## 2019-05-04 MED ORDER — CEFDINIR 300 MG PO CAPS: 300.0000 mg | ORAL_CAPSULE | Freq: Two times a day (BID) | ORAL | 0 refills | Status: DC

## 2019-05-04 NOTE — Telephone Encounter (Signed)
Change to omnicef 300 mg 1 po bid x 10 days

## 2019-05-04 NOTE — Telephone Encounter (Signed)
Pt notified of the change. Sent pharmacy

## 2019-05-22 ENCOUNTER — Other Ambulatory Visit: Payer: Self-pay | Admitting: Family Medicine

## 2019-05-22 DIAGNOSIS — J301 Allergic rhinitis due to pollen: Secondary | ICD-10-CM

## 2019-05-22 DIAGNOSIS — I10 Essential (primary) hypertension: Secondary | ICD-10-CM

## 2019-06-02 ENCOUNTER — Telehealth: Payer: Self-pay | Admitting: Specialist

## 2019-06-02 NOTE — Telephone Encounter (Signed)
Called patient left message on voicemail to call back concerning message needing an appointment with Dr Louanne Skye or Jeneen Rinks for back pain per pt request

## 2019-06-05 ENCOUNTER — Ambulatory Visit (INDEPENDENT_AMBULATORY_CARE_PROVIDER_SITE_OTHER): Payer: Managed Care, Other (non HMO)

## 2019-06-05 ENCOUNTER — Other Ambulatory Visit: Payer: Self-pay | Admitting: Specialist

## 2019-06-05 ENCOUNTER — Encounter: Payer: Self-pay | Admitting: Specialist

## 2019-06-05 ENCOUNTER — Ambulatory Visit (INDEPENDENT_AMBULATORY_CARE_PROVIDER_SITE_OTHER): Payer: Managed Care, Other (non HMO) | Admitting: Specialist

## 2019-06-05 VITALS — BP 145/100 | HR 73 | Ht 72.0 in | Wt 395.0 lb

## 2019-06-05 DIAGNOSIS — M5136 Other intervertebral disc degeneration, lumbar region: Secondary | ICD-10-CM | POA: Diagnosis not present

## 2019-06-05 DIAGNOSIS — M48062 Spinal stenosis, lumbar region with neurogenic claudication: Secondary | ICD-10-CM

## 2019-06-05 DIAGNOSIS — M4726 Other spondylosis with radiculopathy, lumbar region: Secondary | ICD-10-CM

## 2019-06-05 MED ORDER — TRAMADOL HCL 50 MG PO TABS: 100.0000 mg | ORAL_TABLET | Freq: Four times a day (QID) | ORAL | 0 refills | Status: DC | PRN

## 2019-06-05 MED ORDER — GABAPENTIN 300 MG PO CAPS: 300.0000 mg | ORAL_CAPSULE | Freq: Every day | ORAL | 3 refills | Status: DC

## 2019-06-05 MED ORDER — METHYLPREDNISOLONE 4 MG PO TBPK: ORAL_TABLET | ORAL | 0 refills | Status: DC
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
## 2019-06-05 NOTE — Patient Instructions (Signed)
Avoid bending, stooping and avoid lifting weights greater than 10 lbs. Avoid prolong standing and walking. Avoid frequent bending and stooping  No lifting greater than 10 lbs. May use ice or moist heat for pain. Weight loss is of benefit. Handicap license is approved. Myleogram and post myelogram CT scan to assess for stenosis

## 2019-06-10 IMAGING — CT CT L SPINE W/O CM
3 of 6 series · 11 of 33 positions shown, 13 images · non-contrast
Comparison: Lumbar radiographs 06/22/2017

CLINICAL DATA: Back pain.  Left hip pain.  No injury.

EXAM:
CT LUMBAR SPINE WITHOUT CONTRAST
TECHNIQUE: Multidetector CT imaging of the lumbar spine was performed without
intravenous contrast administration. Multiplanar CT image
reconstructions were also generated.

[Series 4: t spine soft · axial · 0.43mm/px · z∈[-357,-117]mm · 5 of 170 slices shown, 7 images]
[im 25/170  soft-tissue]
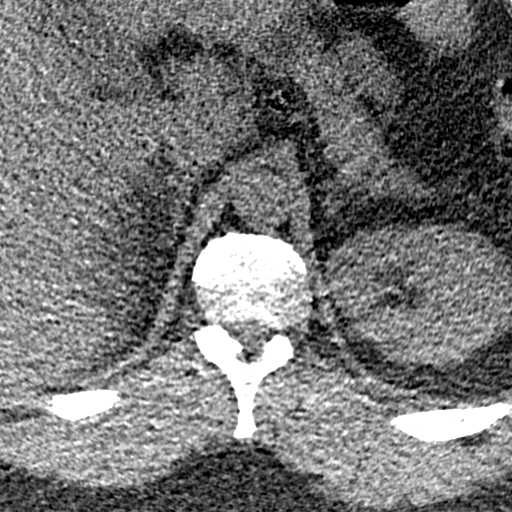
[im 25/170  bone]
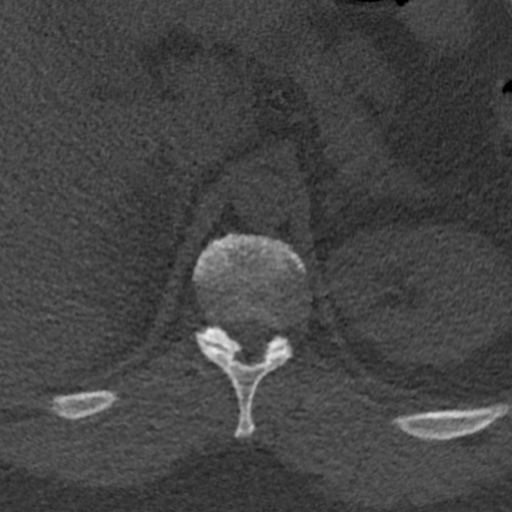
[im 49/170  bone]
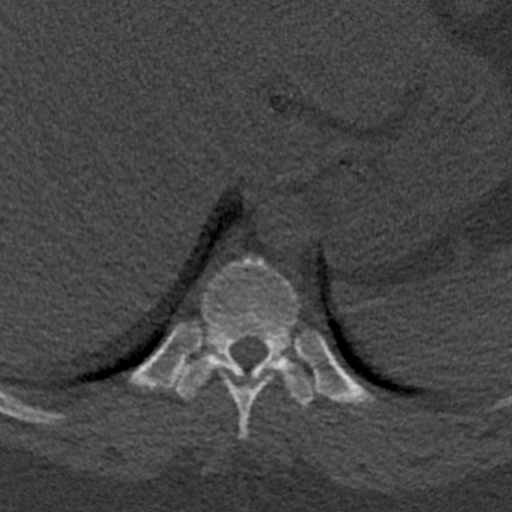
[im 97/170  bone]
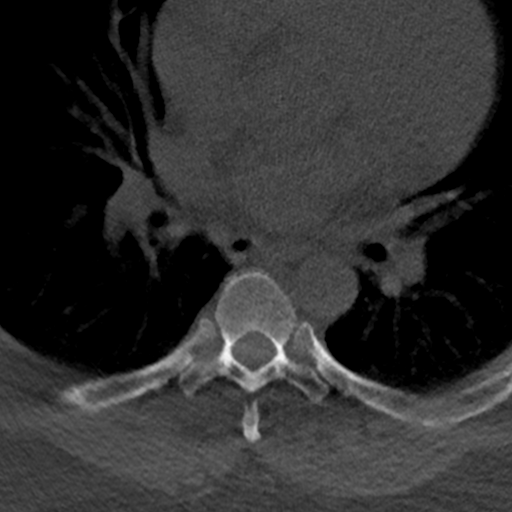
[im 121/170  bone]
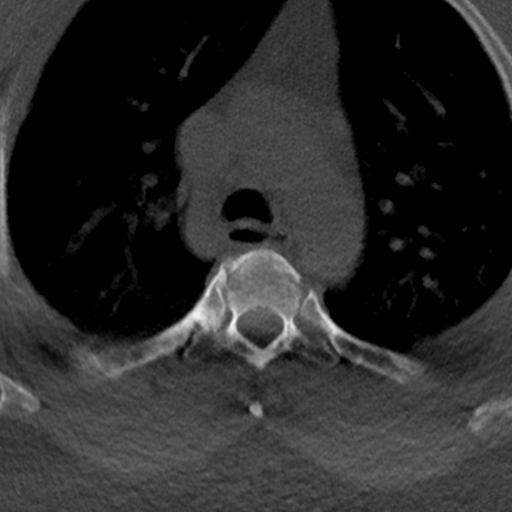
[im 145/170  soft-tissue]
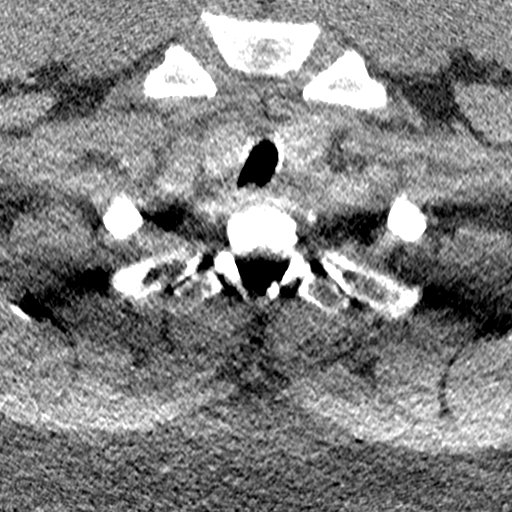
[im 145/170  bone]
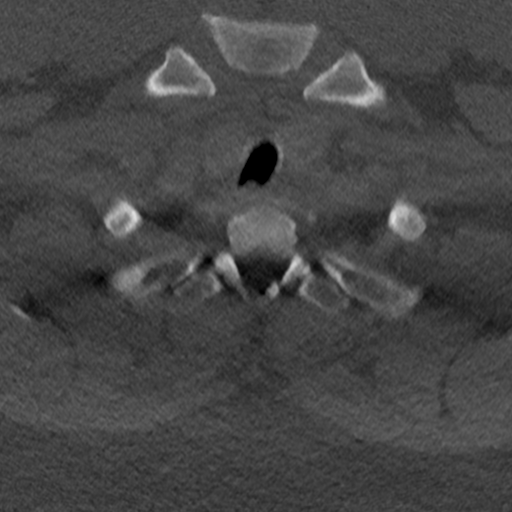

[Series 11: coronal bone · coronal · 0.31mm/px · 1 of 73 slices shown]
[im 37/73  bone]
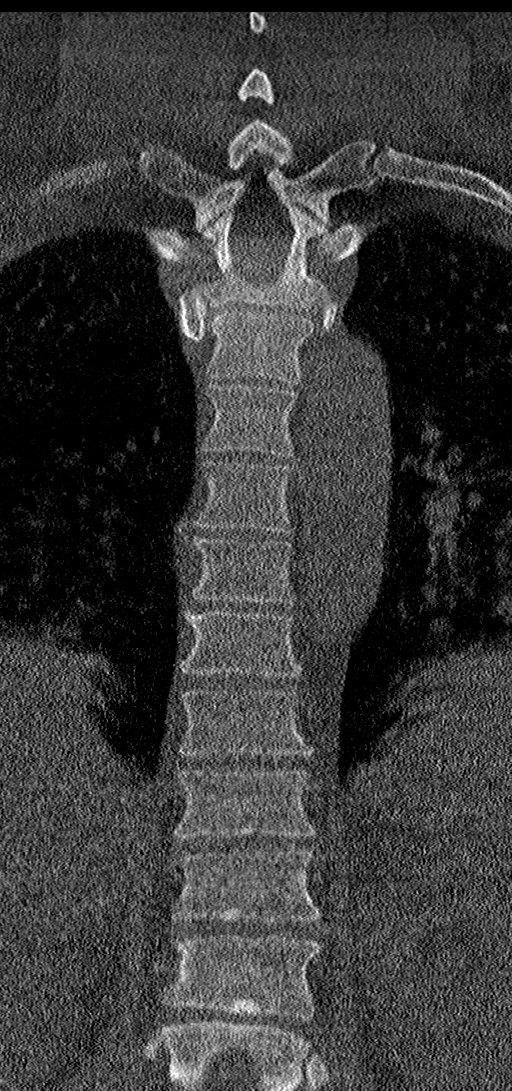

[Series 12: sagittal bone · sagittal · 0.31mm/px · 5 of 82 slices shown]
[im 14/82  bone]
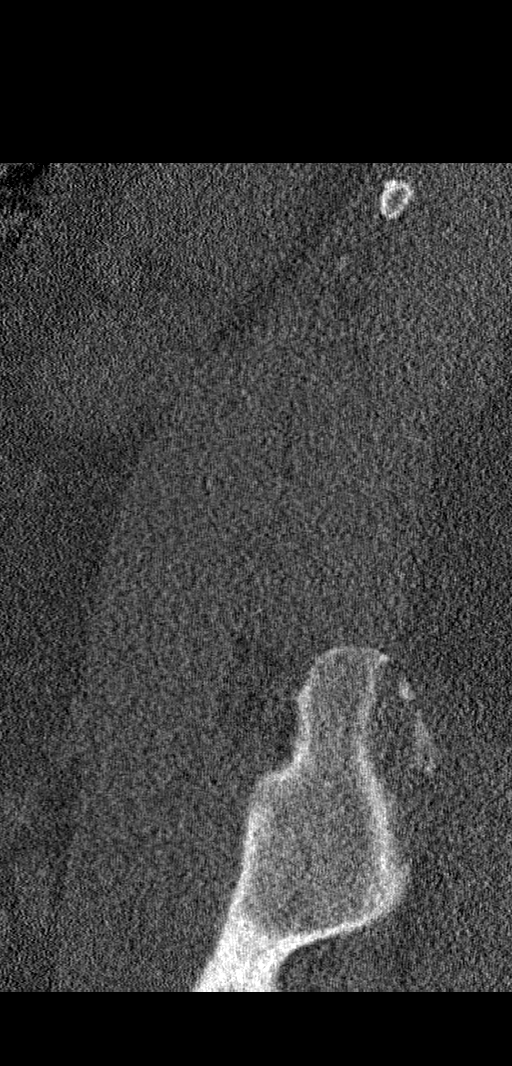
[im 28/82  bone]
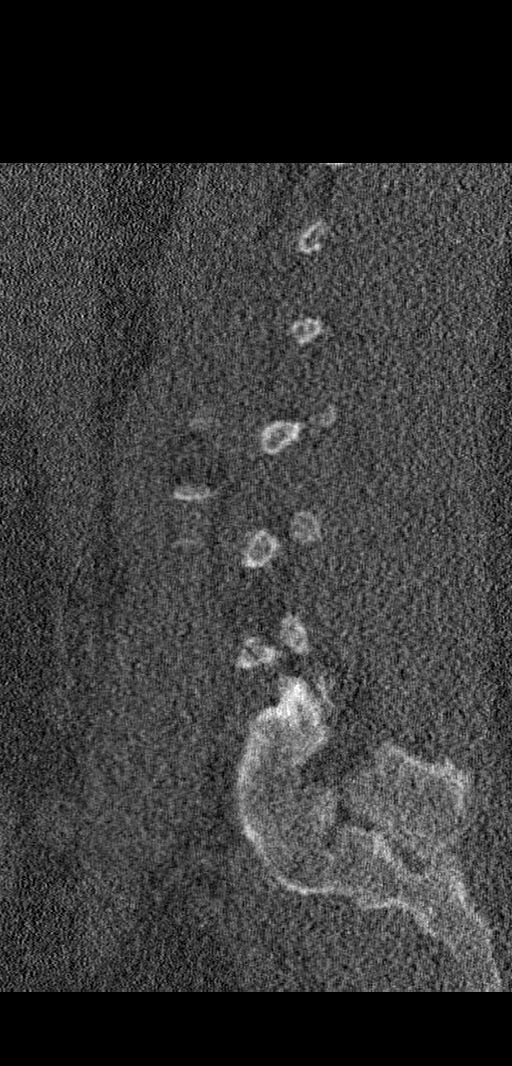
[im 41/82  bone]
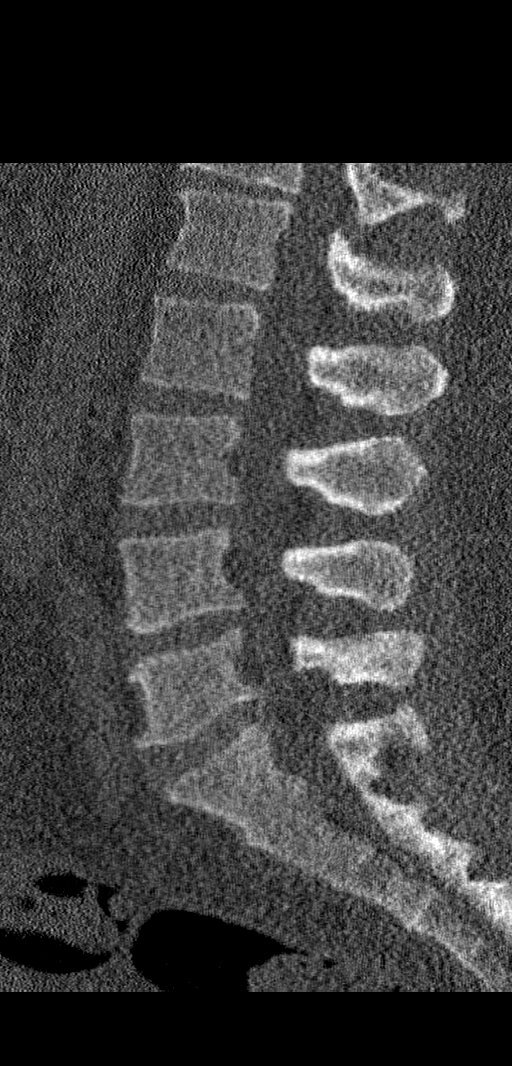
[im 55/82  bone]
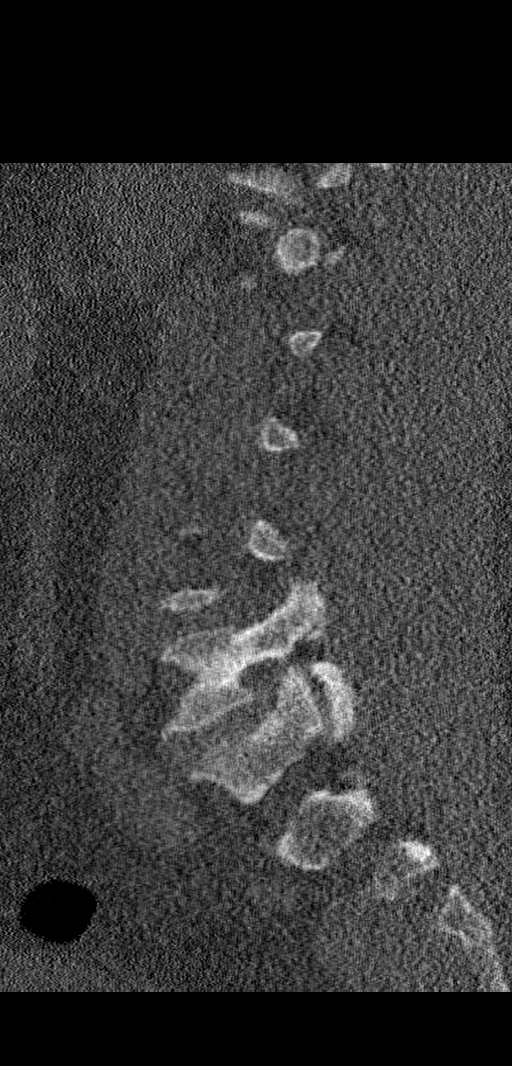
[im 68/82  bone]
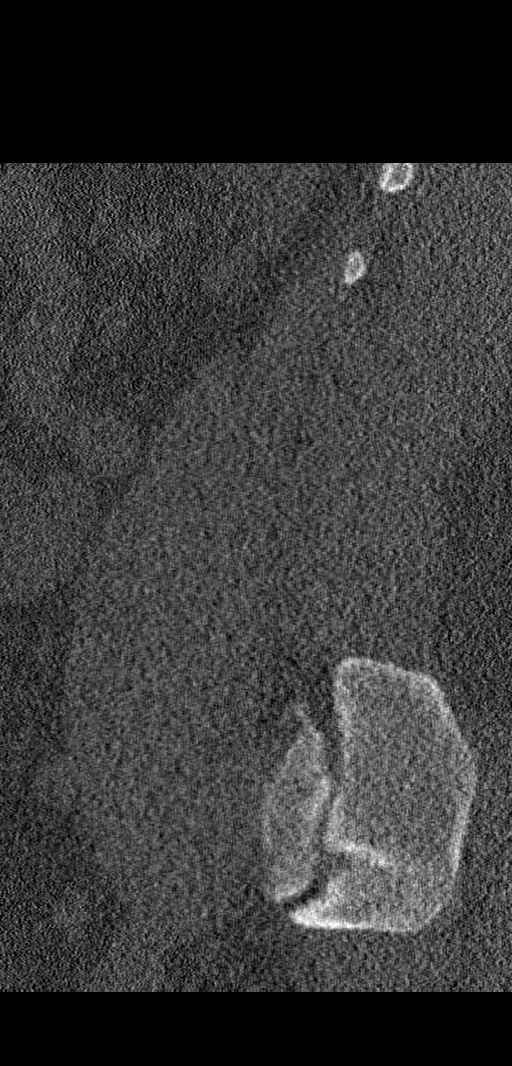

[11 of 33 positions shown; findings below may reference images not displayed]

FINDINGS: Segmentation: Normal

Alignment: Normal

Vertebrae: Negative for fracture or mass.  No bone lesion.

Paraspinal and other soft tissues: Negative for mass or adenopathy.

Disc levels: L1-2: Negative

L2-3: Negative

L3-4: Mild disc bulging and mild facet degeneration. Mild spinal
stenosis

L4-5: Diffuse disc bulging and endplate spurring and moderate facet
hypertrophy. Mild spinal stenosis.

L5-S1: Moderate disc degeneration with disc bulging and diffuse
endplate spurring. Moderate facet hypertrophy bilaterally. Severe
foraminal encroachment bilaterally with impingement of the L5 nerve
root bilaterally. Subarticular stenosis with likely S1 impingement
bilaterally.
IMPRESSION: Negative for fracture

Mild spinal stenosis L3-4 and L4-5

Disc degeneration and spondylosis and facet degeneration L5-S1
causing subarticular and marked foraminal encroachment bilaterally.

## 2019-06-12 ENCOUNTER — Encounter: Payer: Self-pay | Admitting: Specialist

## 2019-06-12 ENCOUNTER — Encounter: Payer: Self-pay | Admitting: Radiology

## 2019-06-12 ENCOUNTER — Other Ambulatory Visit: Payer: Self-pay | Admitting: Family Medicine

## 2019-06-12 ENCOUNTER — Telehealth: Payer: Self-pay | Admitting: Specialist

## 2019-06-12 DIAGNOSIS — E785 Hyperlipidemia, unspecified: Secondary | ICD-10-CM

## 2019-06-12 DIAGNOSIS — R600 Localized edema: Secondary | ICD-10-CM

## 2019-06-12 NOTE — Telephone Encounter (Signed)
Received call from patient asking if he can get a not for being out of work Saturday and Sunday. Patient said he was in a lot of pain. The number to contact patient is [phone]

## 2019-06-12 NOTE — Telephone Encounter (Signed)
I called and lmom that his note has been written and sent to him in MyChart to print off, however if he needs a stamped signature on it I would have to mail it or he would have to pick up.  He will need to let me know.

## 2019-06-13 ENCOUNTER — Other Ambulatory Visit: Payer: Self-pay | Admitting: Family Medicine

## 2019-06-13 DIAGNOSIS — J301 Allergic rhinitis due to pollen: Secondary | ICD-10-CM

## 2019-06-13 NOTE — Addendum Note (Signed)
Addended by: Daylene Posey T on: 06/13/2019 02:01 PM   Modules accepted: Orders

## 2019-07-05 ENCOUNTER — Encounter: Payer: Self-pay | Admitting: Specialist

## 2019-07-05 ENCOUNTER — Other Ambulatory Visit: Payer: Self-pay

## 2019-07-05 ENCOUNTER — Ambulatory Visit (INDEPENDENT_AMBULATORY_CARE_PROVIDER_SITE_OTHER): Payer: Managed Care, Other (non HMO) | Admitting: Specialist

## 2019-07-05 VITALS — BP 161/110 | HR 108 | Ht 72.0 in | Wt 395.0 lb

## 2019-07-05 DIAGNOSIS — M546 Pain in thoracic spine: Secondary | ICD-10-CM

## 2019-07-05 DIAGNOSIS — M542 Cervicalgia: Secondary | ICD-10-CM

## 2019-07-16 ENCOUNTER — Other Ambulatory Visit: Payer: Self-pay | Admitting: Family Medicine

## 2019-07-16 DIAGNOSIS — I10 Essential (primary) hypertension: Secondary | ICD-10-CM

## 2019-08-03 ENCOUNTER — Ambulatory Visit: Payer: Managed Care, Other (non HMO) | Admitting: Specialist

## 2019-08-03 ENCOUNTER — Encounter: Payer: Self-pay | Admitting: Specialist

## 2019-08-03 ENCOUNTER — Other Ambulatory Visit: Payer: Self-pay

## 2019-08-03 VITALS — BP 168/115 | HR 93 | Ht 72.0 in | Wt 395.0 lb

## 2019-08-03 DIAGNOSIS — M48062 Spinal stenosis, lumbar region with neurogenic claudication: Secondary | ICD-10-CM | POA: Diagnosis not present

## 2019-08-03 DIAGNOSIS — M5136 Other intervertebral disc degeneration, lumbar region: Secondary | ICD-10-CM | POA: Diagnosis not present

## 2019-08-03 MED ORDER — TRAMADOL HCL 50 MG PO TABS: 100.0000 mg | ORAL_TABLET | Freq: Four times a day (QID) | ORAL | 0 refills | Status: DC | PRN

## 2019-08-03 MED ORDER — GABAPENTIN 300 MG PO CAPS: 300.0000 mg | ORAL_CAPSULE | Freq: Two times a day (BID) | ORAL | 3 refills | Status: DC
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
## 2019-08-03 NOTE — Patient Instructions (Signed)
Avoid frequent bending and stooping  No lifting greater than 10 lbs. May use ice or moist heat for pain. Weight loss is of benefit. Best medication for lumbar disc disease is arthritis medications like motrin, celebrex and naprosyn. Exercise is important to improve your indurance and does allow people to function better inspite of back pain.  Avoid bending, stooping and avoid lifting weights greater than 10 lbs. Avoid prolong standing and walking. Avoid frequent bending and stooping  No lifting greater than 10 lbs. May use ice or moist heat for pain. Weight loss is of benefit. Handicap license is approved. Dr. Romona Curls secretary/Assistant will call to arrange for epidural steroid injection

## 2019-08-24 ENCOUNTER — Other Ambulatory Visit: Payer: Self-pay | Admitting: Family Medicine

## 2019-08-24 DIAGNOSIS — I10 Essential (primary) hypertension: Secondary | ICD-10-CM

## 2019-09-05 ENCOUNTER — Ambulatory Visit (INDEPENDENT_AMBULATORY_CARE_PROVIDER_SITE_OTHER): Payer: Managed Care, Other (non HMO) | Admitting: Physical Medicine and Rehabilitation

## 2019-09-05 ENCOUNTER — Other Ambulatory Visit: Payer: Self-pay

## 2019-09-05 ENCOUNTER — Encounter: Payer: Self-pay | Admitting: Physical Medicine and Rehabilitation

## 2019-09-05 ENCOUNTER — Ambulatory Visit: Payer: Self-pay

## 2019-09-05 VITALS — BP 177/107 | HR 71

## 2019-09-05 DIAGNOSIS — M5416 Radiculopathy, lumbar region: Secondary | ICD-10-CM | POA: Diagnosis not present

## 2019-09-05 MED ORDER — DEXAMETHASONE SODIUM PHOSPHATE 10 MG/ML IJ SOLN: 15.0000 mg | Freq: Once | INTRAMUSCULAR | Status: AC

## 2019-09-05 NOTE — Progress Notes (Signed)
.  Numeric Pain Rating Scale and Functional Assessment Average Pain 7   In the last MONTH (on 0-10 scale) has pain interfered with the following?  1. General activity like being  able to carry out your everyday physical activities such as walking, climbing stairs, carrying groceries, or moving a chair?  Rating(7)   +Driver, -BT, -Dye Allergies.   

## 2019-10-17 ENCOUNTER — Other Ambulatory Visit: Payer: Self-pay | Admitting: Family Medicine

## 2019-10-17 DIAGNOSIS — I10 Essential (primary) hypertension: Secondary | ICD-10-CM

## 2019-11-19 NOTE — Progress Notes (Signed)
Jack Thomas - 44 y.o. male MRN [id number]  Date of birth: May 11, 1976  Office Visit Note: Visit Date: 09/05/2019 PCP: Ann Held, DO Referred by: Ann Held, *  Subjective: Chief Complaint  Patient presents with  . Lower Back - Pain   HPI:  [name] is a 44 y.o. male who comes in today Planned right L3 transforaminal epidural steroid injection at the request of Dr. Basil Dess. The patient has failed conservative care including home exercise, medications, time and activity modification.  This injection will be diagnostic and hopefully therapeutic.  Please see requesting physician notes for further details and justification.  Technically the injection will be quite challenging the patient is 395 pounds.  He is having mainly right-sided hip and leg pain.  Myelogram noted below.   ROS Otherwise per HPI.  Assessment & Plan: Visit Diagnoses:  1. Lumbar radiculopathy     Plan: No additional findings.   Meds & Orders:  Meds ordered this encounter  Medications  . dexamethasone (DECADRON) injection 15 mg    Orders Placed This Encounter  Procedures  . XR C-ARM NO REPORT  . Epidural Steroid injection    Follow-up: No follow-ups on file.   Procedures: No procedures performed  Lumbosacral Transforaminal Epidural Steroid Injection - Sub-Pedicular Approach with Fluoroscopic Guidance  Patient: Jack Thomas      Date of Birth: 1976-05-12 MRN: [id number] PCP: Ann Held, DO      Visit Date: 09/05/2019   Universal Protocol:    Date/Time: 09/05/2019  Consent Given By: the patient  Position: PRONE  Additional Comments: Vital signs were monitored before and after the procedure. Patient was prepped and draped in the usual sterile fashion. The correct patient, procedure, and site was verified.   Injection Procedure Details:  Procedure Site One Meds Administered:  Meds ordered this encounter  Medications  . dexamethasone (DECADRON)  injection 15 mg    Laterality: Right  Location/Site:  L3-L4  Needle size: 22 G  Needle type: Spinal  Needle Placement: Transforaminal  Findings:    -Comments: There was good flow of contrast under the pedicle with lateral fluoroscopic imaging showing placement in the foramen  Procedure Details: After squaring off the end-plates to get a true AP view, the C-arm was positioned so that an oblique view of the foramen as noted above was visualized. The target area is just inferior to the "nose of the scotty dog" or sub pedicular. The soft tissues overlying this structure were infiltrated with 2-3 ml. of 1% Lidocaine without Epinephrine.  The spinal needle was inserted toward the target using a "trajectory" view along the fluoroscope beam.  Under AP and lateral visualization, the needle was advanced so it did not puncture dura and was located close the 6 O'Clock position of the pedical in AP tracterory. Biplanar projections were used to confirm position. Aspiration was confirmed to be negative for CSF and/or blood. A 1-2 ml. volume of Isovue-250 was injected and flow of contrast was noted at each level. Radiographs were obtained for documentation purposes.   After attaining the desired flow of contrast documented above, a 0.5 to 1.0 ml test dose of 0.25% Marcaine was injected into each respective transforaminal space.  The patient was observed for 90 seconds post injection.  After no sensory deficits were reported, and normal lower extremity motor function was noted,   the above injectate was administered so that equal amounts of the injectate were placed at each foramen (level) into  the transforaminal epidural space.   Additional Comments:  The patient tolerated the procedure well Dressing: 2 x 2 sterile gauze and Band-Aid    Post-procedure details: Patient was observed during the procedure. Post-procedure instructions were reviewed.  Patient left the clinic in stable condition.      Clinical History: FINDINGS:  Limited exam due to body habitus and suboptimal positioning on GI fluoroscopy table.  MYELOGRAM:  Scout films: Normal alignment. No acute fractures, lytic, or blastic lesions. Abdominal soft tissues unremarkable.  An L3-L4 interlaminar approach was taken. CSF flow was noted upon positioning of the needle in the expected location of the thecal sac. A small amount of contrast likely within the disc at this level was noted upon initial injection. Of note, the patient reported symptoms similar to his usual\leg pain symptoms upon initial injection of contrast. The needle was then slightly retracted and again CSF return was noted. The remainder of the 10 mL of contrast was injected without resistance and appeared to be likely intrathecal on lateral images. No evidence of myelographic block.   LUMBAR SPINE CT:  Small amount of contrast material is seen in the dorsal aspect of the L3-L4 disc. Remainder of the contrast is predominantly extradural with a small amount of intrathecal contrast, limiting evaluation of the cauda equina nerve roots.  Lumbar vertebral body heights and alignment are maintained. No evidence of acute fracture. Mild multilevel disc height loss and endplate spurring. Level by level details as below:  T11-T12: Mild disc bulge and asymmetric left facet arthrosis with small osteophytes contributing to mild to moderate left foraminal stenosis. T12-L1: No substantial canal or foraminal stenosis. L1-L2: No substantial canal or foraminal stenosis. L2-L3: Mild diffuse disc bulge contributes to likely mild bilateral foraminal narrowing. No evidence of high-grade canal stenosis L3-L4: Mild diffuse disc bulge, bilateral facet arthrosis, and ligamentum flavum thickening contributes to mild to moderate bilateral foraminal narrowing. No evidence of high-grade canal stenosis. L4-L5: Left eccentric disc osteophyte complex, bilateral facet arthrosis, and ligamentum  flavum thickening contributes to mild to moderate right and likely severe left foraminal stenosis. There is likely at least mild canal stenosis, suboptimally evaluated. L5-S1: Moderate diffuse disc bulge with superimposed bilateral facet arthrosis contributes to advanced bilateral foraminal stenosis and mild to moderate canal stenosis.  The paravertebral soft tissues are within normal limits.  CONCLUSION:   Exam is limited secondary to predominantly epidural contrast injection, limiting evaluation of the cauda equina nerve roots.  Small focus of intradiscal injection of contrast at L3-4, reproduced the patient's symptoms, suggesting this as the possible site of patient discomfort.  Multilevel degenerative changes throughout the lumbar spine as above with areas of advanced foraminal stenosis bilaterally at L5-S1 and on the left at L4-L5. There is likely mild canal stenosis at L4-L5 and mild to moderate canal stenosis at L5-S1 without findings on this exam to suggest high-grade canal stenosis, although evaluation is limited due to predominantly extradural contrast injection. Consider further evaluation with MRI if clinically feasible or if indicated.     Objective:  VS:  HT:    WT:   BMI:     BP:(!) 177/107  HR:71bpm  TEMP: ( )  RESP:  Physical Exam  Ortho Exam Imaging: No results found.

## 2019-11-19 NOTE — Procedures (Signed)
Lumbosacral Transforaminal Epidural Steroid Injection - Sub-Pedicular Approach with Fluoroscopic Guidance  Patient: Jack Thomas      Date of Birth: 1975-09-08 MRN: [id number] PCP: Ann Held, DO      Visit Date: 09/05/2019   Universal Protocol:    Date/Time: 09/05/2019  Consent Given By: the patient  Position: PRONE  Additional Comments: Vital signs were monitored before and after the procedure. Patient was prepped and draped in the usual sterile fashion. The correct patient, procedure, and site was verified.   Injection Procedure Details:  Procedure Site One Meds Administered:  Meds ordered this encounter  Medications  . dexamethasone (DECADRON) injection 15 mg    Laterality: Right  Location/Site:  L3-L4  Needle size: 22 G  Needle type: Spinal  Needle Placement: Transforaminal  Findings:    -Comments: There was good flow of contrast under the pedicle with lateral fluoroscopic imaging showing placement in the foramen  Procedure Details: After squaring off the end-plates to get a true AP view, the C-arm was positioned so that an oblique view of the foramen as noted above was visualized. The target area is just inferior to the "nose of the scotty dog" or sub pedicular. The soft tissues overlying this structure were infiltrated with 2-3 ml. of 1% Lidocaine without Epinephrine.  The spinal needle was inserted toward the target using a "trajectory" view along the fluoroscope beam.  Under AP and lateral visualization, the needle was advanced so it did not puncture dura and was located close the 6 O'Clock position of the pedical in AP tracterory. Biplanar projections were used to confirm position. Aspiration was confirmed to be negative for CSF and/or blood. A 1-2 ml. volume of Isovue-250 was injected and flow of contrast was noted at each level. Radiographs were obtained for documentation purposes.   After attaining the desired flow of contrast documented above, a  0.5 to 1.0 ml test dose of 0.25% Marcaine was injected into each respective transforaminal space.  The patient was observed for 90 seconds post injection.  After no sensory deficits were reported, and normal lower extremity motor function was noted,   the above injectate was administered so that equal amounts of the injectate were placed at each foramen (level) into the transforaminal epidural space.   Additional Comments:  The patient tolerated the procedure well Dressing: 2 x 2 sterile gauze and Band-Aid    Post-procedure details: Patient was observed during the procedure. Post-procedure instructions were reviewed.  Patient left the clinic in stable condition.

## 2019-11-27 ENCOUNTER — Ambulatory Visit (INDEPENDENT_AMBULATORY_CARE_PROVIDER_SITE_OTHER): Payer: Managed Care, Other (non HMO) | Admitting: Family Medicine

## 2019-11-27 ENCOUNTER — Encounter: Payer: Self-pay | Admitting: Gastroenterology

## 2019-11-27 ENCOUNTER — Telehealth: Payer: Self-pay | Admitting: *Deleted

## 2019-11-27 ENCOUNTER — Encounter: Payer: Self-pay | Admitting: Family Medicine

## 2019-11-27 ENCOUNTER — Other Ambulatory Visit: Payer: Self-pay

## 2019-11-27 VITALS — BP 168/120 | HR 78 | Temp 96.9°F | Resp 20 | Ht 72.0 in | Wt 394.8 lb

## 2019-11-27 DIAGNOSIS — E1165 Type 2 diabetes mellitus with hyperglycemia: Secondary | ICD-10-CM

## 2019-11-27 DIAGNOSIS — E119 Type 2 diabetes mellitus without complications: Secondary | ICD-10-CM

## 2019-11-27 DIAGNOSIS — G4733 Obstructive sleep apnea (adult) (pediatric): Secondary | ICD-10-CM

## 2019-11-27 DIAGNOSIS — I10 Essential (primary) hypertension: Secondary | ICD-10-CM | POA: Diagnosis not present

## 2019-11-27 DIAGNOSIS — E785 Hyperlipidemia, unspecified: Secondary | ICD-10-CM | POA: Diagnosis not present

## 2019-11-27 DIAGNOSIS — K529 Noninfective gastroenteritis and colitis, unspecified: Secondary | ICD-10-CM | POA: Insufficient documentation

## 2019-11-27 DIAGNOSIS — J3089 Other allergic rhinitis: Secondary | ICD-10-CM

## 2019-11-27 LAB — COMPREHENSIVE METABOLIC PANEL
ALT: 25 U/L (ref 0–53)
AST: 24 U/L (ref 0–37)
Albumin: 4 g/dL (ref 3.5–5.2)
Alkaline Phosphatase: 63 U/L (ref 39–117)
BUN: 10 mg/dL (ref 6–23)
CO2: 33 mEq/L — ABNORMAL HIGH (ref 19–32)
Calcium: 9.2 mg/dL (ref 8.4–10.5)
Chloride: 99 mEq/L (ref 96–112)
Creatinine, Ser: 0.84 mg/dL (ref 0.40–1.50)
GFR: 121.47 mL/min (ref >60.00)
Glucose, Bld: 289 mg/dL — ABNORMAL HIGH (ref 70–99)
Potassium: 3.7 mEq/L (ref 3.5–5.1)
Sodium: 137 mEq/L (ref 135–145)
Total Bilirubin: 0.6 mg/dL (ref 0.2–1.2)
Total Protein: 7.1 g/dL (ref 6.0–8.3)

## 2019-11-27 LAB — LIPID PANEL
Cholesterol: 201 mg/dL — ABNORMAL HIGH (ref 0–200)
HDL: 34.8 mg/dL — ABNORMAL LOW (ref >39.00)
NonHDL: 166.31
Total CHOL/HDL Ratio: 6
Triglycerides: 288 mg/dL — ABNORMAL HIGH (ref 0.0–149.0)
VLDL: 57.6 mg/dL — ABNORMAL HIGH (ref 0.0–40.0)

## 2019-11-27 LAB — THYROID PANEL WITH TSH
Free Thyroxine Index: 1.4 (ref 1.4–3.8)
T3 Uptake: 37 % — ABNORMAL HIGH (ref 22–35)
T4, Total: 3.7 ug/dL — ABNORMAL LOW (ref 4.9–10.5)
TSH: 1.56 mIU/L (ref 0.40–4.50)

## 2019-11-27 LAB — HEMOGLOBIN A1C: Hgb A1c MFr Bld: 8.9 % — ABNORMAL HIGH (ref 4.6–6.5)

## 2019-11-27 LAB — LDL CHOLESTEROL, DIRECT: Direct LDL: 119 mg/dL

## 2019-11-27 NOTE — Assessment & Plan Note (Signed)
Well controlled, no changes to meds. Encouraged heart healthy diet such as the DASH diet and exercise as tolerated.  °

## 2019-11-27 NOTE — Patient Instructions (Addendum)
Carbohydrate Counting for Diabetes Mellitus, Adult  Carbohydrate counting is a method of keeping track of how many carbohydrates you eat. Eating carbohydrates naturally increases the amount of sugar (glucose) in the blood. Counting how many carbohydrates you eat helps keep your blood glucose within normal limits, which helps you manage your diabetes (diabetes mellitus). It is important to know how many carbohydrates you can safely have in each meal. This is different for every person. A diet and nutrition specialist (registered dietitian) can help you make a meal plan and calculate how many carbohydrates you should have at each meal and snack. Carbohydrates are found in the following foods:  Grains, such as breads and cereals.  Dried beans and soy products.  Starchy vegetables, such as potatoes, peas, and corn.  Fruit and fruit juices.  Milk and yogurt.  Sweets and snack foods, such as cake, cookies, candy, chips, and soft drinks. How do I count carbohydrates? There are two ways to count carbohydrates in food. You can use either of the methods or a combination of both. Reading "Nutrition Facts" on packaged food The "Nutrition Facts" list is included on the labels of almost all packaged foods and beverages in the [place] It includes:  The serving size.  Information about nutrients in each serving, including the grams (g) of carbohydrate per serving. To use the "Nutrition Facts":  Decide how many servings you will have.  Multiply the number of servings by the number of carbohydrates per serving.  The resulting number is the total amount of carbohydrates that you will be having. Learning standard serving sizes of other foods When you eat carbohydrate foods that are not packaged or do not include "Nutrition Facts" on the label, you need to measure the servings in order to count the amount of carbohydrates:  Measure the foods that you will eat with a food scale or measuring cup, if  needed.  Decide how many standard-size servings you will eat.  Multiply the number of servings by 15. Most carbohydrate-rich foods have about 15 g of carbohydrates per serving. ? For example, if you eat 8 oz (170 g) of strawberries, you will have eaten 2 servings and 30 g of carbohydrates (2 servings x 15 g = 30 g).  For foods that have more than one food mixed, such as soups and casseroles, you must count the carbohydrates in each food that is included. The following list contains standard serving sizes of common carbohydrate-rich foods. Each of these servings has about 15 g of carbohydrates:   hamburger bun or  English muffin.   oz (15 mL) syrup.   oz (14 g) jelly.  1 slice of bread.  1 six-inch tortilla.  3 oz (85 g) cooked rice or pasta.  4 oz (113 g) cooked dried beans.  4 oz (113 g) starchy vegetable, such as peas, corn, or potatoes.  4 oz (113 g) hot cereal.  4 oz (113 g) mashed potatoes or  of a large baked potato.  4 oz (113 g) canned or frozen fruit.  4 oz (120 mL) fruit juice.  4-6 crackers.  6 chicken nuggets.  6 oz (170 g) unsweetened dry cereal.  6 oz (170 g) plain fat-free yogurt or yogurt sweetened with artificial sweeteners.  8 oz (240 mL) milk.  8 oz (170 g) fresh fruit or one small piece of fruit.  24 oz (680 g) popped popcorn. Example of carbohydrate counting Sample meal  3 oz (85 g) chicken breast.  6 oz (170 g)   brown rice.  4 oz (113 g) corn.  8 oz (240 mL) milk.  8 oz (170 g) strawberries with sugar-free whipped topping. Carbohydrate calculation 1. Identify the foods that contain carbohydrates: ? Rice. ? Corn. ? Milk. ? Strawberries. 2. Calculate how many servings you have of each food: ? 2 servings rice. ? 1 serving corn. ? 1 serving milk. ? 1 serving strawberries. 3. Multiply each number of servings by 15 g: ? 2 servings rice x 15 g = 30 g. ? 1 serving corn x 15 g = 15 g. ? 1 serving milk x 15 g = 15 g. ? 1  serving strawberries x 15 g = 15 g. 4. Add together all of the amounts to find the total grams of carbohydrates eaten: ? 30 g + 15 g + 15 g + 15 g = 75 g of carbohydrates total. Summary  Carbohydrate counting is a method of keeping track of how many carbohydrates you eat.  Eating carbohydrates naturally increases the amount of sugar (glucose) in the blood.  Counting how many carbohydrates you eat helps keep your blood glucose within normal limits, which helps you manage your diabetes.  A diet and nutrition specialist (registered dietitian) can help you make a meal plan and calculate how many carbohydrates you should have at each meal and snack. This information is not intended to replace advice given to you by your health care provider. Make sure you discuss any questions you have with your health care provider. Document Revised: 02/11/2017 Document Reviewed: 01/01/2016 Elsevier Patient Education  [place].  Diarrhea, Adult Diarrhea is frequent loose and watery bowel movements. Diarrhea can make you feel weak and cause you to become dehydrated. Dehydration can make you tired and thirsty, cause you to have a dry mouth, and decrease how often you urinate. Diarrhea typically lasts 2-3 days. However, it can last longer if it is a sign of something more serious. It is important to treat your diarrhea as told by your health care provider. Follow these instructions at home: Eating and drinking     Follow these recommendations as told by your health care provider:  Take an oral rehydration solution (ORS). This is an over-the-counter medicine that helps return your body to its normal balance of nutrients and water. It is found at pharmacies and retail stores.  Drink plenty of fluids, such as water, ice chips, diluted fruit juice, and low-calorie sports drinks. You can drink milk also, if desired.  Avoid drinking fluids that contain a lot of sugar or caffeine, such as energy drinks,  sports drinks, and soda.  Eat bland, easy-to-digest foods in small amounts as you are able. These foods include bananas, applesauce, rice, lean meats, toast, and crackers.  Avoid alcohol.  Avoid spicy or fatty foods.  Medicines  Take over-the-counter and prescription medicines only as told by your health care provider.  If you were prescribed an antibiotic medicine, take it as told by your health care provider. Do not stop using the antibiotic even if you start to feel better. General instructions   Wash your hands often using soap and water. If soap and water are not available, use a hand sanitizer. Others in the household should wash their hands as well. Hands should be washed: ? After using the toilet or changing a diaper. ? Before preparing, cooking, or serving food. ? While caring for a sick person or while visiting someone in a hospital.  Drink enough fluid to keep your urine pale  yellow.  Rest at home while you recover.  Watch your condition for any changes.  Take a warm bath to relieve any burning or pain from frequent diarrhea episodes.  Keep all follow-up visits as told by your health care provider. This is important. Contact a health care provider if:  You have a fever.  Your diarrhea gets worse.  You have new symptoms.  You cannot keep fluids down.  You feel light-headed or dizzy.  You have a headache.  You have muscle cramps. Get help right away if:  You have chest pain.  You feel extremely weak or you faint.  You have bloody or black stools or stools that look like tar.  You have severe pain, cramping, or bloating in your abdomen.  You have trouble breathing or you are breathing very quickly.  Your heart is beating very quickly.  Your skin feels cold and clammy.  You feel confused.  You have signs of dehydration, such as: ? Dark urine, very little urine, or no urine. ? Cracked lips. ? Dry mouth. ? Sunken  eyes. ? Sleepiness. ? Weakness. Summary  Diarrhea is frequent loose and watery bowel movements. Diarrhea can make you feel weak and cause you to become dehydrated.  Drink enough fluids to keep your urine pale yellow.  Make sure that you wash your hands after using the toilet. If soap and water are not available, use hand sanitizer.  Contact a health care provider if your diarrhea gets worse or you have new symptoms.  Get help right away if you have signs of dehydration. This information is not intended to replace advice given to you by your health care provider. Make sure you discuss any questions you have with your health care provider. Document Revised: 12/06/2018 Document Reviewed: 12/24/2017 Elsevier Patient Education  [place].

## 2019-11-27 NOTE — Telephone Encounter (Signed)
[facility] returned my call stating pt had a routine eye exam on 08/03/19 but has never had a diabetic eye exam.  Please advise?

## 2019-11-27 NOTE — Assessment & Plan Note (Signed)
hgba1c to be checked, minimize simple carbs. Increase exercise as tolerated. Continue current meds  

## 2019-11-27 NOTE — Assessment & Plan Note (Signed)
Tolerating statin, encouraged heart healthy diet, avoid trans fats, minimize simple carbs and saturated fats. Increase exercise as tolerated 

## 2019-11-27 NOTE — Telephone Encounter (Signed)
Spoke with [facility] @ [phone] and requested most recent diabetic eye exam.

## 2019-11-27 NOTE — Progress Notes (Signed)
Patient ID: Jack Thomas, male    DOB: 03-11-1976  Age: 44 y.o. MRN: [id number]    Subjective:  Subjective  HPI Jakaylen Faber presents for f/u dm, chol and htn    He c/o diarrhea that is worsening.  He is eating more fruits and veg and is not eating any fried food.   No NV   + cramping  He is also c/o worsening allergies -- he is taking all his allergy meds and still is congested and sneezing all the time   HPI HYPERTENSION   Blood pressure range-not checking   Chest pain- no      Dyspnea- no Lightheadedness- no   Edema- no  Other side effects - no   Medication compliance: good Low salt diet- yes     DIABETES    Blood Sugar ranges-140s  Polyuria- no New Visual problems- no  Hypoglycemic symptoms- no  Other side effects-no Medication compliance - good Last eye exam- due Foot exam- today   HYPERLIPIDEMIA  Medication compliance- good RUQ pain- no  Muscle aches- no Other side effects-no      Review of Systems  Constitutional: Negative for appetite change, diaphoresis, fatigue and unexpected weight change.  HENT: Positive for congestion, postnasal drip, rhinorrhea and sneezing.   Eyes: Negative for pain, redness and visual disturbance.  Respiratory: Positive for cough. Negative for chest tightness, shortness of breath and wheezing.   Cardiovascular: Negative for chest pain, palpitations and leg swelling.  Endocrine: Negative for cold intolerance, heat intolerance, polydipsia, polyphagia and polyuria.  Genitourinary: Negative for difficulty urinating, dysuria and frequency.  Neurological: Negative for dizziness, light-headedness, numbness and headaches.    History Past Medical History:  Diagnosis Date  . Arthritis    right knee; left shoulder  . Diabetes mellitus without complication ([place])   . Hyperlipidemia   . Hypertension    takes metoprolol daily  . Sleep apnea    sleep study - recommended cpap    He has a past surgical history that includes Knee  surgery (2009); Nasal sinus surgery (12/03/2011); and Nasal septoplasty w/ turbinoplasty (12/03/2011).   His family history includes Allergies in his mother; Diabetes in his father and mother; Hyperlipidemia in his father; Hypertension in his father.He reports that he quit smoking about 9 years ago. His smoking use included cigars. He has a 3.00 pack-year smoking history. He has never used smokeless tobacco. He reports that he does not drink alcohol or use drugs.  Current Outpatient Medications on File Prior to Visit  Medication Sig Dispense Refill  . amLODipine (NORVASC) 10 MG tablet TAKE 1 TABLET BY MOUTH DAILY *NEEDS APPOINTMENT* 30 tablet 0  . fenofibrate 160 MG tablet TAKE 1 TABLET BY MOUTH DAILY 90 tablet 1  . fluticasone (FLONASE) 50 MCG/ACT nasal spray SPRAY TWO SPRAYS IN EACH NOSTRIL ONCE DAILY.  Needs ov before any more refills. 16 g 5  . furosemide (LASIX) 20 MG tablet TAKE ONE TABLET BY MOUTH DAILY 30 tablet 5  . gabapentin (NEURONTIN) 300 MG capsule Take 1 capsule (300 mg total) by mouth 2 (two) times daily. 180 capsule 3  . glucose blood (ONETOUCH VERIO) test strip Check blood sugars once daily and as needed. 100 each 12  . levocetirizine (XYZAL) 5 MG tablet Take 1 tablet (5 mg total) by mouth every evening. 30 tablet 5  . loratadine (CLARITIN) 10 MG tablet Take 1 tablet (10 mg total) by mouth daily. 30 tablet 0  . metoprolol (TOPROL-XL) 200 MG  24 hr tablet TAKE 1 TABLET BY MOUTH DAILY 90 tablet 1  . montelukast (SINGULAIR) 10 MG tablet TAKE ONE TABLET BY MOUTH AT BEDTIME 90 tablet 1  . ONETOUCH DELICA LANCETS 99991111 MISC Check blood sugars once daily and as needed. 100 each 12  . rosuvastatin (CRESTOR) 20 MG tablet Take 1 tablet (20 mg total) daily by mouth. 30 tablet 2  . telmisartan (MICARDIS) 80 MG tablet TAKE ONE TABLET BY MOUTH DAILY (Patient taking differently: Take 40 mg by mouth daily. ) 30 tablet 0   Current Facility-Administered Medications on File Prior to Visit  Medication  Dose Route Frequency Provider Last Rate Last Admin  . dexamethasone (DECADRON) injection 15 mg  15 mg Other Once Magnus Sinning, MD         Objective:  Objective  Physical Exam Vitals and nursing note reviewed.  Constitutional:      General: He is sleeping. He is not in acute distress.    Appearance: He is well-developed. He is obese.  HENT:     Head: Normocephalic and atraumatic.  Eyes:     Pupils: Pupils are equal, round, and reactive to light.  Neck:     Thyroid: No thyromegaly.  Cardiovascular:     Rate and Rhythm: Normal rate and regular rhythm.     Heart sounds: Normal heart sounds. No murmur.  Pulmonary:     Effort: Pulmonary effort is normal. No respiratory distress.     Breath sounds: Normal breath sounds. No wheezing or rales.  Chest:     Chest wall: No tenderness.  Musculoskeletal:        General: No tenderness.     Cervical back: Normal range of motion and neck supple.  Skin:    General: Skin is warm and dry.  Neurological:     Mental Status: He is oriented to person, place, and time.  Psychiatric:        Behavior: Behavior normal.        Thought Content: Thought content normal.        Judgment: Judgment normal.    BP (!) 168/120 (BP Location: Right Arm, Cuff Size: Large)   Pulse 78   Temp (!) 96.9 F (36.1 C) (Oral)   Resp 20   Ht 6' (1.829 m)   Wt (!) 394 lb 12.8 oz (179.1 kg)   SpO2 95%   BMI 53.54 kg/m  Wt Readings from Last 3 Encounters:  11/27/19 (!) 394 lb 12.8 oz (179.1 kg)  08/03/19 (!) 395 lb (179.2 kg)  07/05/19 (!) 395 lb (179.2 kg)     Lab Results  Component Value Date   WBC 10.9 (H) 12/02/2017   HGB 14.3 12/02/2017   HCT 42.7 12/02/2017   PLT 249.0 12/02/2017   GLUCOSE 289 (H) 11/27/2019   CHOL 201 (H) 11/27/2019   TRIG 288.0 (H) 11/27/2019   HDL 34.80 (L) 11/27/2019   LDLDIRECT 119.0 11/27/2019   LDLCALC 68 03/05/2017   ALT 25 11/27/2019   AST 24 11/27/2019   NA 137 11/27/2019   K 3.7 11/27/2019   CL 99 11/27/2019    CREATININE 0.84 11/27/2019   BUN 10 11/27/2019   CO2 33 (H) 11/27/2019   TSH 1.87 12/02/2017   INR 0.93 01/16/2013   HGBA1C 8.9 (H) 11/27/2019   MICROALBUR 1.2 06/10/2017    DG Foot Complete Right  Result Date: 05/01/2018 CLINICAL DATA:  Right foot pain. EXAM: RIGHT FOOT COMPLETE - 3+ VIEW COMPARISON:  None. FINDINGS: There is a  comminuted fracture of the base of the first distal phalanx with articular surface involvement and without significant displacement or angulation. There is no evidence of arthropathy or other focal bone abnormality. Soft tissues are unremarkable. IMPRESSION: Comminuted fracture of the base of the first distal phalanx with articular surface involvement and without significant displacement or angulation. Electronically Signed   By: Kathreen Devoid   On: 05/01/2018 11:52     Assessment & Plan:  Plan  I have discontinued Allyson Sabal traMADol, ofloxacin, amoxicillin-clavulanate, cefdinir, methylPREDNISolone, and traMADol. I am also having him maintain his glucose blood, OneTouch Delica Lancets 99991111, loratadine, rosuvastatin, fluticasone, levocetirizine, metoprolol, fenofibrate, furosemide, montelukast, gabapentin, amLODipine, and telmisartan. We will continue to administer dexamethasone.  No orders of the defined types were placed in this encounter.   Problem List Items Addressed This Visit      Unprioritized   Chronic diarrhea    Inc fiber in diet Refer to GI Probiotic       Relevant Orders   Ambulatory referral to Gastroenterology   Thyroid Panel With TSH   Diabetes mellitus type II, uncontrolled ([place])    hgba1c to be checked, minimize simple carbs. Increase exercise as tolerated. Continue current meds       Essential hypertension    Well controlled, no changes to meds. Encouraged heart healthy diet such as the DASH diet and exercise as tolerated.       Relevant Orders   Lipid panel (Completed)   Comprehensive metabolic panel (Completed)    Hyperlipidemia - Primary    Tolerating statin, encouraged heart healthy diet, avoid trans fats, minimize simple carbs and saturated fats. Increase exercise as tolerated      Relevant Orders   Lipid panel (Completed)   Comprehensive metabolic panel (Completed)   Obstructive sleep apnea    con't cpap       Other Visit Diagnoses    Diet-controlled diabetes mellitus ([place])       Relevant Orders   Comprehensive metabolic panel (Completed)   Hemoglobin A1c (Completed)   Environmental and seasonal allergies       Relevant Orders   Ambulatory referral to Allergy      Follow-up: Return in about 3 weeks (around 12/18/2019), or if symptoms worsen or fail to improve, for hypertension.  Ann Held, DO

## 2019-11-27 NOTE — Assessment & Plan Note (Signed)
- 

## 2019-11-27 NOTE — Assessment & Plan Note (Signed)
Inc fiber in diet Refer to GI Probiotic

## 2019-11-28 NOTE — Telephone Encounter (Signed)
Pt needs to make eye app and let them know he is a diabetic

## 2019-11-29 ENCOUNTER — Other Ambulatory Visit: Payer: Self-pay | Admitting: Family Medicine

## 2019-11-29 DIAGNOSIS — E1169 Type 2 diabetes mellitus with other specified complication: Secondary | ICD-10-CM

## 2019-11-29 DIAGNOSIS — E1165 Type 2 diabetes mellitus with hyperglycemia: Secondary | ICD-10-CM

## 2019-11-29 DIAGNOSIS — E785 Hyperlipidemia, unspecified: Secondary | ICD-10-CM

## 2019-11-30 ENCOUNTER — Telehealth: Payer: Self-pay

## 2019-11-30 ENCOUNTER — Other Ambulatory Visit: Payer: Self-pay

## 2019-11-30 MED ORDER — RYBELSUS 3 MG PO TABS: 1.0000 | ORAL_TABLET | Freq: Every day | ORAL | 2 refills | Status: DC

## 2019-11-30 NOTE — Telephone Encounter (Signed)
PA initiated via Covermymeds; KEY: [id number]. PA approved.   [id number];Review Type:Prior Auth;Coverage Start Date:11/30/2019;Coverage End Date:08/02/2098;

## 2019-12-02 ENCOUNTER — Encounter: Payer: Self-pay | Admitting: Family Medicine

## 2019-12-06 ENCOUNTER — Other Ambulatory Visit: Payer: Self-pay | Admitting: Family Medicine

## 2019-12-06 DIAGNOSIS — I10 Essential (primary) hypertension: Secondary | ICD-10-CM

## 2019-12-19 ENCOUNTER — Ambulatory Visit (INDEPENDENT_AMBULATORY_CARE_PROVIDER_SITE_OTHER): Payer: Managed Care, Other (non HMO) | Admitting: Family Medicine

## 2019-12-19 ENCOUNTER — Other Ambulatory Visit: Payer: Self-pay

## 2019-12-19 ENCOUNTER — Encounter: Payer: Self-pay | Admitting: Family Medicine

## 2019-12-19 VITALS — BP 144/100 | HR 81 | Temp 97.1°F | Resp 18 | Ht 72.0 in | Wt 386.6 lb

## 2019-12-19 DIAGNOSIS — I10 Essential (primary) hypertension: Secondary | ICD-10-CM | POA: Diagnosis not present

## 2019-12-19 MED ORDER — CHLORTHALIDONE 25 MG PO TABS: 25.0000 mg | ORAL_TABLET | Freq: Every day | ORAL | 2 refills | Status: DC

## 2019-12-19 NOTE — Patient Instructions (Signed)
DASH Eating Plan DASH stands for "Dietary Approaches to Stop Hypertension." The DASH eating plan is a healthy eating plan that has been shown to reduce high blood pressure (hypertension). It may also reduce your risk for type 2 diabetes, heart disease, and stroke. The DASH eating plan may also help with weight loss. What are tips for following this plan?  General guidelines  Avoid eating more than 2,300 mg (milligrams) of salt (sodium) a day. If you have hypertension, you may need to reduce your sodium intake to 1,500 mg a day.  Limit alcohol intake to no more than 1 drink a day for nonpregnant women and 2 drinks a day for men. One drink equals 12 oz of beer, 5 oz of wine, or 1 oz of hard liquor.  Work with your health care provider to maintain a healthy body weight or to lose weight. Ask what an ideal weight is for you.  Get at least 30 minutes of exercise that causes your heart to beat faster (aerobic exercise) most days of the week. Activities may include walking, swimming, or biking.  Work with your health care provider or diet and nutrition specialist (dietitian) to adjust your eating plan to your individual calorie needs. Reading food labels   Check food labels for the amount of sodium per serving. Choose foods with less than 5 percent of the Daily Value of sodium. Generally, foods with less than 300 mg of sodium per serving fit into this eating plan.  To find whole grains, look for the word "whole" as the first word in the ingredient list. Shopping  Buy products labeled as "low-sodium" or "no salt added."  Buy fresh foods. Avoid canned foods and premade or frozen meals. Cooking  Avoid adding salt when cooking. Use salt-free seasonings or herbs instead of table salt or sea salt. Check with your health care provider or pharmacist before using salt substitutes.  Do not fry foods. Cook foods using healthy methods such as baking, boiling, grilling, and broiling instead.  Cook with  heart-healthy oils, such as olive, canola, soybean, or sunflower oil. Meal planning  Eat a balanced diet that includes: ? 5 or more servings of fruits and vegetables each day. At each meal, try to fill half of your plate with fruits and vegetables. ? Up to 6-8 servings of whole grains each day. ? Less than 6 oz of lean meat, poultry, or fish each day. A 3-oz serving of meat is about the same size as a deck of cards. One egg equals 1 oz. ? 2 servings of low-fat dairy each day. ? A serving of nuts, seeds, or beans 5 times each week. ? Heart-healthy fats. Healthy fats called Omega-3 fatty acids are found in foods such as flaxseeds and coldwater fish, like sardines, salmon, and mackerel.  Limit how much you eat of the following: ? Canned or prepackaged foods. ? Food that is high in trans fat, such as fried foods. ? Food that is high in saturated fat, such as fatty meat. ? Sweets, desserts, sugary drinks, and other foods with added sugar. ? Full-fat dairy products.  Do not salt foods before eating.  Try to eat at least 2 vegetarian meals each week.  Eat more home-cooked food and less restaurant, buffet, and fast food.  When eating at a restaurant, ask that your food be prepared with less salt or no salt, if possible. What foods are recommended? The items listed may not be a complete list. Talk with your dietitian about   what dietary choices are best for you. Grains Whole-grain or whole-wheat bread. Whole-grain or whole-wheat pasta. Brown rice. Oatmeal. Quinoa. Bulgur. Whole-grain and low-sodium cereals. Pita bread. Low-fat, low-sodium crackers. Whole-wheat flour tortillas. Vegetables Fresh or frozen vegetables (raw, steamed, roasted, or grilled). Low-sodium or reduced-sodium tomato and vegetable juice. Low-sodium or reduced-sodium tomato sauce and tomato paste. Low-sodium or reduced-sodium canned vegetables. Fruits All fresh, dried, or frozen fruit. Canned fruit in natural juice (without  added sugar). Meat and other protein foods Skinless chicken or turkey. Ground chicken or turkey. Pork with fat trimmed off. Fish and seafood. Egg whites. Dried beans, peas, or lentils. Unsalted nuts, nut butters, and seeds. Unsalted canned beans. Lean cuts of beef with fat trimmed off. Low-sodium, lean deli meat. Dairy Low-fat (1%) or fat-free (skim) milk. Fat-free, low-fat, or reduced-fat cheeses. Nonfat, low-sodium ricotta or cottage cheese. Low-fat or nonfat yogurt. Low-fat, low-sodium cheese. Fats and oils Soft margarine without trans fats. Vegetable oil. Low-fat, reduced-fat, or light mayonnaise and salad dressings (reduced-sodium). Canola, safflower, olive, soybean, and sunflower oils. Avocado. Seasoning and other foods Herbs. Spices. Seasoning mixes without salt. Unsalted popcorn and pretzels. Fat-free sweets. What foods are not recommended? The items listed may not be a complete list. Talk with your dietitian about what dietary choices are best for you. Grains Baked goods made with fat, such as croissants, muffins, or some breads. Dry pasta or rice meal packs. Vegetables Creamed or fried vegetables. Vegetables in a cheese sauce. Regular canned vegetables (not low-sodium or reduced-sodium). Regular canned tomato sauce and paste (not low-sodium or reduced-sodium). Regular tomato and vegetable juice (not low-sodium or reduced-sodium). Pickles. Olives. Fruits Canned fruit in a light or heavy syrup. Fried fruit. Fruit in cream or butter sauce. Meat and other protein foods Fatty cuts of meat. Ribs. Fried meat. Bacon. Sausage. Bologna and other processed lunch meats. Salami. Fatback. Hotdogs. Bratwurst. Salted nuts and seeds. Canned beans with added salt. Canned or smoked fish. Whole eggs or egg yolks. Chicken or turkey with skin. Dairy Whole or 2% milk, cream, and half-and-half. Whole or full-fat cream cheese. Whole-fat or sweetened yogurt. Full-fat cheese. Nondairy creamers. Whipped toppings.  Processed cheese and cheese spreads. Fats and oils Butter. Stick margarine. Lard. Shortening. Ghee. Bacon fat. Tropical oils, such as coconut, palm kernel, or palm oil. Seasoning and other foods Salted popcorn and pretzels. Onion salt, garlic salt, seasoned salt, table salt, and sea salt. Worcestershire sauce. Tartar sauce. Barbecue sauce. Teriyaki sauce. Soy sauce, including reduced-sodium. Steak sauce. Canned and packaged gravies. Fish sauce. Oyster sauce. Cocktail sauce. Horseradish that you find on the shelf. Ketchup. Mustard. Meat flavorings and tenderizers. Bouillon cubes. Hot sauce and Tabasco sauce. Premade or packaged marinades. Premade or packaged taco seasonings. Relishes. Regular salad dressings. Where to find more information:  National Heart, Lung, and Blood Institute: www.nhlbi.nih.gov  American Heart Association: www.heart.org Summary  The DASH eating plan is a healthy eating plan that has been shown to reduce high blood pressure (hypertension). It may also reduce your risk for type 2 diabetes, heart disease, and stroke.  With the DASH eating plan, you should limit salt (sodium) intake to 2,300 mg a day. If you have hypertension, you may need to reduce your sodium intake to 1,500 mg a day.  When on the DASH eating plan, aim to eat more fresh fruits and vegetables, whole grains, lean proteins, low-fat dairy, and heart-healthy fats.  Work with your health care provider or diet and nutrition specialist (dietitian) to adjust your eating plan to your   individual calorie needs. This information is not intended to replace advice given to you by your health care provider. Make sure you discuss any questions you have with your health care provider. Document Revised: 07/02/2017 Document Reviewed: 07/13/2016 Elsevier Patient Education  2020 Elsevier Inc.  

## 2019-12-19 NOTE — Progress Notes (Signed)
Patient ID: Jack Thomas, male    DOB: 1976/06/02  Age: 44 y.o. MRN: [id number]    Subjective:  Subjective  HPI Jack Thomas presents for f/u bp    No other complaints   Review of Systems  Constitutional: Negative.   HENT: Negative for congestion, ear pain, hearing loss, nosebleeds, postnasal drip, rhinorrhea, sinus pressure, sneezing and tinnitus.   Eyes: Negative for photophobia, discharge, itching and visual disturbance.  Respiratory: Negative.   Cardiovascular: Negative.   Gastrointestinal: Negative for abdominal distention, abdominal pain, anal bleeding, blood in stool and constipation.  Endocrine: Negative.   Genitourinary: Negative.   Musculoskeletal: Negative.   Skin: Negative.   Allergic/Immunologic: Negative.   Neurological: Negative for dizziness, weakness, light-headedness, numbness and headaches.  Psychiatric/Behavioral: Negative for agitation, confusion, decreased concentration, dysphoric mood, sleep disturbance and suicidal ideas. The patient is not nervous/anxious.     History Past Medical History:  Diagnosis Date  . Arthritis    right knee; left shoulder  . Diabetes mellitus without complication ([place])   . Hyperlipidemia   . Hypertension    takes metoprolol daily  . Sleep apnea    sleep study - recommended cpap    He has a past surgical history that includes Knee surgery (2009); Nasal sinus surgery (12/03/2011); and Nasal septoplasty w/ turbinoplasty (12/03/2011).   His family history includes Allergies in his mother; Diabetes in his father and mother; Hyperlipidemia in his father; Hypertension in his father.He reports that he has been smoking cigars. He has a 3.00 pack-year smoking history. He has never used smokeless tobacco. He reports current alcohol use. He reports that he does not use drugs.  Current Outpatient Medications on File Prior to Visit  Medication Sig Dispense Refill  . glucose blood (ONETOUCH VERIO) test strip Check blood sugars once daily and  as needed. 100 each 12  . loratadine (CLARITIN) 10 MG tablet Take 1 tablet (10 mg total) by mouth daily. 30 tablet 0  . ONETOUCH DELICA LANCETS 99991111 MISC Check blood sugars once daily and as needed. 100 each 12  . Semaglutide (RYBELSUS) 3 MG TABS Take 1 tablet by mouth daily. 30 tablet 2   Current Facility-Administered Medications on File Prior to Visit  Medication Dose Route Frequency Provider Last Rate Last Admin  . dexamethasone (DECADRON) injection 15 mg  15 mg Other Once Magnus Sinning, MD         Objective:  Objective  Physical Exam Vitals and nursing note reviewed.  Constitutional:      General: He is sleeping.     Appearance: He is well-developed.  HENT:     Head: Normocephalic and atraumatic.  Eyes:     Pupils: Pupils are equal, round, and reactive to light.  Neck:     Thyroid: No thyromegaly.  Cardiovascular:     Rate and Rhythm: Normal rate and regular rhythm.     Heart sounds: No murmur.  Pulmonary:     Effort: Pulmonary effort is normal. No respiratory distress.     Breath sounds: Normal breath sounds. No wheezing or rales.  Chest:     Chest wall: No tenderness.  Musculoskeletal:        General: No tenderness.     Cervical back: Normal range of motion and neck supple.  Skin:    General: Skin is warm and dry.  Neurological:     Mental Status: He is oriented to person, place, and time.  Psychiatric:        Behavior: Behavior normal.  Thought Content: Thought content normal.        Judgment: Judgment normal.    BP (!) 144/100 (BP Location: Right Arm, Patient Position: Sitting, Cuff Size: Large)   Pulse 81   Temp (!) 97.1 F (36.2 C) (Temporal)   Resp 18   Ht 6' (1.829 m)   Wt (!) 386 lb 9.6 oz (175.4 kg)   SpO2 95%   BMI 52.43 kg/m  Wt Readings from Last 3 Encounters:  12/21/19 (!) 394 lb (178.7 kg)  12/21/19 (!) 394 lb 4 oz (178.8 kg)  12/19/19 (!) 386 lb 9.6 oz (175.4 kg)     Lab Results  Component Value Date   WBC 10.9 (H) 12/02/2017     HGB 14.3 12/02/2017   HCT 42.7 12/02/2017   PLT 249.0 12/02/2017   GLUCOSE 289 (H) 11/27/2019   CHOL 201 (H) 11/27/2019   TRIG 288.0 (H) 11/27/2019   HDL 34.80 (L) 11/27/2019   LDLDIRECT 119.0 11/27/2019   LDLCALC 68 03/05/2017   ALT 25 11/27/2019   AST 24 11/27/2019   NA 137 11/27/2019   K 3.7 11/27/2019   CL 99 11/27/2019   CREATININE 0.84 11/27/2019   BUN 10 11/27/2019   CO2 33 (H) 11/27/2019   TSH 1.56 11/27/2019   INR 0.93 01/16/2013   HGBA1C 8.9 (H) 11/27/2019   MICROALBUR 1.2 06/10/2017    DG Foot Complete Right  Result Date: 05/01/2018 CLINICAL DATA:  Right foot pain. EXAM: RIGHT FOOT COMPLETE - 3+ VIEW COMPARISON:  None. FINDINGS: There is a comminuted fracture of the base of the first distal phalanx with articular surface involvement and without significant displacement or angulation. There is no evidence of arthropathy or other focal bone abnormality. Soft tissues are unremarkable. IMPRESSION: Comminuted fracture of the base of the first distal phalanx with articular surface involvement and without significant displacement or angulation. Electronically Signed   By: Kathreen Devoid   On: 05/01/2018 11:52     Assessment & Plan:  Plan  I have changed Jack Thomas's metoprolol. I am also having him start on losartan. Additionally, I am having him maintain his glucose blood, OneTouch Delica Lancets 99991111, loratadine, and Rybelsus. We will continue to administer dexamethasone.  Meds ordered this encounter  Medications  . DISCONTD: chlorthalidone (HYGROTON) 25 MG tablet    Sig: Take 1 tablet (25 mg total) by mouth daily.    Dispense:  30 tablet    Refill:  2  . metoprolol (TOPROL-XL) 200 MG 24 hr tablet    Sig: Take 1 tablet (200 mg total) by mouth daily.    Dispense:  90 tablet    Refill:  1  . losartan (COZAAR) 50 MG tablet    Sig: Take 1 tablet (50 mg total) by mouth daily.    Dispense:  30 tablet    Refill:  1    Problem List Items Addressed This Visit       Unprioritized   Essential hypertension - Primary   Relevant Medications   metoprolol (TOPROL-XL) 200 MG 24 hr tablet   losartan (COZAAR) 50 MG tablet    Poorly controlled will alter medications, encouraged DASH diet, minimize caffeine and obtain adequate sleep. Report concerning symptoms and follow up as directed and as needed Follow-up: Return in about 3 weeks (around 01/09/2020), or if symptoms worsen or fail to improve, for ++.  Ann Held, DO

## 2019-12-21 ENCOUNTER — Ambulatory Visit (INDEPENDENT_AMBULATORY_CARE_PROVIDER_SITE_OTHER): Payer: Managed Care, Other (non HMO) | Admitting: Allergy and Immunology

## 2019-12-21 ENCOUNTER — Ambulatory Visit: Payer: Managed Care, Other (non HMO) | Admitting: Gastroenterology

## 2019-12-21 ENCOUNTER — Other Ambulatory Visit: Payer: Self-pay

## 2019-12-21 ENCOUNTER — Encounter: Payer: Self-pay | Admitting: Allergy and Immunology

## 2019-12-21 ENCOUNTER — Encounter: Payer: Self-pay | Admitting: Gastroenterology

## 2019-12-21 VITALS — BP 142/80 | HR 73 | Temp 96.8°F | Ht 73.0 in | Wt 394.2 lb

## 2019-12-21 DIAGNOSIS — J3089 Other allergic rhinitis: Secondary | ICD-10-CM

## 2019-12-21 DIAGNOSIS — R1084 Generalized abdominal pain: Secondary | ICD-10-CM | POA: Diagnosis not present

## 2019-12-21 DIAGNOSIS — R197 Diarrhea, unspecified: Secondary | ICD-10-CM | POA: Diagnosis not present

## 2019-12-21 DIAGNOSIS — D7219 Other eosinophilia: Secondary | ICD-10-CM

## 2019-12-21 DIAGNOSIS — J4540 Moderate persistent asthma, uncomplicated: Secondary | ICD-10-CM | POA: Diagnosis not present

## 2019-12-21 DIAGNOSIS — J324 Chronic pansinusitis: Secondary | ICD-10-CM

## 2019-12-21 DIAGNOSIS — R109 Unspecified abdominal pain: Secondary | ICD-10-CM | POA: Diagnosis not present

## 2019-12-21 DIAGNOSIS — K219 Gastro-esophageal reflux disease without esophagitis: Secondary | ICD-10-CM

## 2019-12-21 DIAGNOSIS — Z79899 Other long term (current) drug therapy: Secondary | ICD-10-CM

## 2019-12-21 DIAGNOSIS — Z6841 Body Mass Index (BMI) 40.0 and over, adult: Secondary | ICD-10-CM

## 2019-12-21 DIAGNOSIS — D72824 Basophilia: Secondary | ICD-10-CM

## 2019-12-21 DIAGNOSIS — J339 Nasal polyp, unspecified: Secondary | ICD-10-CM | POA: Diagnosis not present

## 2019-12-21 MED ORDER — FLUTICASONE PROPIONATE 50 MCG/ACT NA SUSP: NASAL | 5 refills | Status: DC

## 2019-12-21 MED ORDER — AIRDUO DIGIHALER 113-14 MCG/ACT IN AEPB: INHALATION_SPRAY | RESPIRATORY_TRACT | 5 refills | Status: DC

## 2019-12-21 MED ORDER — AZELASTINE HCL 0.1 % NA SOLN: NASAL | 5 refills | Status: DC

## 2019-12-21 MED ORDER — PROAIR DIGIHALER 108 MCG/ACT IN AEPB: INHALATION_SPRAY | RESPIRATORY_TRACT | 1 refills | Status: DC

## 2019-12-21 MED ORDER — CLENPIQ 10-3.5-12 MG-GM -GM/160ML PO SOLN: 1.0000 | Freq: Once | ORAL | 0 refills | Status: AC

## 2019-12-21 NOTE — Progress Notes (Signed)
Chief Complaint: Diarrhea, abdominal cramping, reflux symptoms  Referring Provider:     Carollee Herter, Alferd Apa, DO    HPI:    Jack Thomas is a 44 y.o. male with a history of hypertension, hyperlipidemia, diabetes (A1c 8.9%), OSA, morbid obesity (BMI 52), referred [facility] for evaluation of diarrhea and abdominal cramping.  He states he has been having postprandial urgency with loose, watery, nonbloody stools. Usually within 10 mins of eating. Has decreased dairy and eating healthier (grilled food, vegetables) without change (avoids all fried foods, heavy seasoning). Sxs present for 1-2 years. No hematochezia or melena. Has trialed OTC anti-diarrheals with some improvement along with Pepto-Bismol. No incontinence or seepage.   Also with intermittent generalized/lower abdominal cramping. Typically in AM, lasting 10-15 mins.  Independent of change in bowel habits.  No Rx trial, but does improve with mustard and pickle juice.   Finally, he reports a long standing hx of reflux (HB, regurgitation). Worse during daytime and post prandial. Worse after caffeinated soda. Takes OTC antacids prn, approx 2-3 times/month. No dysphagia.   No n/v/f/c.    -11/27/2019: A1c 8.9, CO2 33 otherwise normal CMP  No previous EGD or colonoscopy.  No known family history of CRC, GI malignancy, liver disease, pancreatic disease, or IBD.   Past Medical History:  Diagnosis Date  . Arthritis    right knee; left shoulder  . Diabetes mellitus without complication ([place])   . Hyperlipidemia   . Hypertension    takes metoprolol daily  . Sleep apnea    sleep study - recommended cpap     Past Surgical History:  Procedure Laterality Date  . KNEE SURGERY  2009   right  . NASAL SEPTOPLASTY W/ TURBINOPLASTY  12/03/2011   Procedure: NASAL SEPTOPLASTY WITH TURBINATE REDUCTION;  Surgeon: Izora Gala, MD;  Location: [facility];  Service: ENT;  Laterality: Bilateral;  . NASAL SINUS  SURGERY  12/03/2011   Procedure: ENDOSCOPIC SINUS SURGERY;  Surgeon: Izora Gala, MD;  Location: [facility];  Service: ENT;  Laterality: Bilateral;   Family History  Problem Relation Age of Onset  . Diabetes Mother   . Allergies Mother   . Hyperlipidemia Father   . Hypertension Father   . Diabetes Father   . Anesthesia problems Neg Hx   . Hypotension Neg Hx   . Malignant hyperthermia Neg Hx   . Pseudochol deficiency Neg Hx   . Colon cancer Neg Hx   . Esophageal cancer Neg Hx    Social History   Tobacco Use  . Smoking status: Former Smoker    Packs/day: 0.50    Years: 6.00    Pack years: 3.00    Types: Cigars    Quit date: 11/02/2010    Years since quitting: 9.1  . Smokeless tobacco: Never Used  Substance Use Topics  . Alcohol use: Yes    Comment: ocassionally   . Drug use: No   Current Outpatient Medications  Medication Sig Dispense Refill  . amLODipine (NORVASC) 10 MG tablet TAKE 1 TABLET BY MOUTH DAILY **NEEDS APPOINTMENT** 30 tablet 0  . fenofibrate 160 MG tablet TAKE 1 TABLET BY MOUTH DAILY 90 tablet 1  . fluticasone (FLONASE) 50 MCG/ACT nasal spray SPRAY TWO SPRAYS IN EACH NOSTRIL ONCE DAILY.  Needs ov before any more refills. 16 g 5  . furosemide (LASIX) 20 MG tablet TAKE ONE TABLET BY MOUTH DAILY 30 tablet 5  .  gabapentin (NEURONTIN) 300 MG capsule Take 1 capsule (300 mg total) by mouth 2 (two) times daily. 180 capsule 3  . glucose blood (ONETOUCH VERIO) test strip Check blood sugars once daily and as needed. 100 each 12  . levocetirizine (XYZAL) 5 MG tablet Take 1 tablet (5 mg total) by mouth every evening. 30 tablet 5  . loratadine (CLARITIN) 10 MG tablet Take 1 tablet (10 mg total) by mouth daily. 30 tablet 0  . metoprolol (TOPROL-XL) 200 MG 24 hr tablet TAKE 1 TABLET BY MOUTH DAILY 90 tablet 1  . ONETOUCH DELICA LANCETS 99991111 MISC Check blood sugars once daily and as needed. 100 each 12  . rosuvastatin (CRESTOR) 20 MG tablet Take 1 tablet (20 mg total) daily by mouth. 30  tablet 2  . Semaglutide (RYBELSUS) 3 MG TABS Take 1 tablet by mouth daily. 30 tablet 2  . telmisartan (MICARDIS) 80 MG tablet TAKE ONE TABLET BY MOUTH DAILY 30 tablet 0  . chlorthalidone (HYGROTON) 25 MG tablet Take 1 tablet (25 mg total) by mouth daily. (Patient not taking: Reported on 12/21/2019) 30 tablet 2   Current Facility-Administered Medications  Medication Dose Route Frequency Provider Last Rate Last Admin  . dexamethasone (DECADRON) injection 15 mg  15 mg Other Once Magnus Sinning, MD       No Known Allergies   Review of Systems: All systems reviewed and negative except where noted in HPI.     Physical Exam:    Wt Readings from Last 3 Encounters:  12/21/19 (!) 394 lb 4 oz (178.8 kg)  12/19/19 (!) 386 lb 9.6 oz (175.4 kg)  11/27/19 (!) 394 lb 12.8 oz (179.1 kg)    BP (!) 142/80   Pulse 73   Temp (!) 96.8 F (36 C)   Ht 6\' 1"  (1.854 m)   Wt (!) 394 lb 4 oz (178.8 kg)   BMI 52.02 kg/m  Constitutional:  Pleasant, in no acute distress. Psychiatric: Normal mood and affect. Behavior is normal. EENT: Pupils normal.  Conjunctivae are normal. No scleral icterus. Neck supple. No cervical LAD. Cardiovascular: Normal rate, regular rhythm. No edema Pulmonary/chest: Effort normal and breath sounds normal. No wheezing, rales or rhonchi. Abdominal: Soft, nondistended, nontender. Bowel sounds active throughout. There are no masses palpable. No hepatomegaly. Neurological: Alert and oriented to person place and time. Skin: Skin is warm and dry. No rashes noted.   ASSESSMENT AND PLAN;   1) Diarrhea 2) Abdominal cramping Suspect component of IBS D based on description, discussed full DDX with the patient today and plan for evaluation as follows:  -Check GI PCR panel to rule out superimposed infection -EGD with duodenal biopsies -Colonoscopy with random rectal biopsies to rule out microscopic colitis, IBD, etc. -Start fiber supplement daily -Continue adequate  hydration -Continue avoidance of exacerbating foods  3) GERD: -Evaluate for erosive esophagitis, LES laxity, hiatal hernia at time of EGD -Resume antireflux lifestyle/dietary modifications with avoidance of exacerbating foods -Okay to resume on-demand OTC antacids  4) Obesity (BMI 52): -Discussed obesity with relation to underlying GI symptoms -Procedures to be performed at [facility] given BMI > 50 -Discussed benefits of weight loss from GI standpoint along with overall health  5) Diabetes (A1c 8.9) -Discussed relationship of uncontrolled diabetes with GI symptoms along with potential GI side effect profile of some of the diabetic medications  The indications, risks, and benefits of EGD and colonoscopy were explained to the patient in detail. Risks include but are not limited to bleeding, perforation,  adverse reaction to medications, and cardiopulmonary compromise. Sequelae include but are not limited to the possibility of surgery, hositalization, and mortality. The patient verbalized understanding and wished to proceed. All questions answered, referred to scheduler and bowel prep ordered. Further recommendations pending results of the exam.     Lavena Bullion, DO, FACG  12/21/2019, 11:12 AM   Ann Held, *

## 2019-12-21 NOTE — Patient Instructions (Signed)
You have been scheduled for an endoscopy and colonoscopy. Please follow the written instructions given to you at your visit today. Please pick up your prep supplies at the pharmacy within the next 1-3 days. If you use inhalers (even only as needed), please bring them with you on the day of your procedure. Your physician has requested that you go to www.startemmi.com and enter the access code given to you at your visit today. This web site gives a general overview about your procedure. However, you should still follow specific instructions given to you by our office regarding your preparation for the procedure.  Your provider has requested that you go to the basement level for lab work at [facility] in [place] [address]. Press "B" on the elevator. The lab is located at the first door on the left as you exit the elevator.  Please start taking a daily fiber supplement such as citrucel, benefiber, metamucil, or fiber choice.   It was a pleasure to see you today!  Vito Cirigliano, D.O.

## 2019-12-21 NOTE — Patient Instructions (Addendum)
  1.  Allergen avoidance measures - dog  2.  Every night use the following medications before placing CPAP:   A. OTC Afrin - single spray single nostril. Alternate every other day  B. Flonase - 2 sprays each nostril  C. Azelastine - 1 spray each nostril  3.  Every day use the following medication:   A. AirDuo 113 Digihaler - 1 inhalation 2 times per day  4. Treat and prevent headaches:   A. Slowly consolidate all forms of caffeine  B. Eliminate analgesics (naprosyn)   5. If needed:   A. Proair HFA Digihaler - 2 inhalations every 4-6 hours  B. Nasal saline  C. loratadine  6. Metoprolol????  7. Blood - CBC w/d, Area 2 aeroallergen profile  8. Return to clinic in 4 weeks or earlier if problem

## 2019-12-21 NOTE — Progress Notes (Signed)
- [place] [facility] [place] - [place] [facility] [place]   Dear Dr. Carollee Herter,  Thank you for referring Jack Thomas to the [facility] on 12/21/2019.   Below is a summation of this patient's evaluation and recommendations.  Thank you for your referral. I will keep you informed about this patient's response to treatment.   If you have any questions please do not hesitate to contact me.   Sincerely,  Jiles Prows, MD Allergy / Immunology [place]   ______________________________________________________________________    NEW PATIENT NOTE  Referring Provider: Ann Held, * Primary Provider: Carollee Herter, Alferd Apa, DO Date of office visit: 12/21/2019    Subjective:   Chief Complaint:  Jack Thomas (DOB: [birth year]) is a 44 y.o. male who presents to the clinic on 12/21/2019 with a chief complaint of Asthma and Allergic Rhinitis  .     HPI: Jack Thomas presents to this clinic in evaluation of several issues.  First, he has a long history of nasal congestion and stuffiness and runny nose and sneezing and some occasional itchy eyes occurring on a perennial basis with flare during the spring and fall usually following exposure to pollen.  This appears to be an issue even though he uses Flonase and an antihistamine on a consistent basis.  He tells me that he underwent a certain nasal surgery in May 2018 and there may have been removal of polyps.  He does not have any anosmia or ugly nasal discharge.  Second, he has wheezing and coughing and shortness of breath which has been present for the past year or so.  He has been given some albuterol which he can tell does help this issue.  If he is exposed to cold air he gets the symptoms as well.  He does not really exert himself to any large degree.  Third, he has headaches.  He has a headache every other day that is pounding and is  behind his eyes with spots in his vision.  He usually can function with these headaches.  He will take 4 Naprosyn tablets every time he gets a headache.  He drinks a coffee per day, a soda per day, and sometimes an energy drink.  Fourth, he has sleep apnea and is having difficulty using his CPAP machine.  He has difficulty using it because he is so congested at nighttime.  Past Medical History:  Diagnosis Date  . Arthritis    right knee; left shoulder  . Diabetes mellitus without complication ([place])   . Hyperlipidemia   . Hypertension    takes metoprolol daily  . Sleep apnea    sleep study - recommended cpap    Past Surgical History:  Procedure Laterality Date  . KNEE SURGERY  2009   right  . NASAL SEPTOPLASTY W/ TURBINOPLASTY  12/03/2011   Procedure: NASAL SEPTOPLASTY WITH TURBINATE REDUCTION;  Surgeon: Izora Gala, MD;  Location: [facility];  Service: ENT;  Laterality: Bilateral;  . NASAL SINUS SURGERY  12/03/2011   Procedure: ENDOSCOPIC SINUS SURGERY;  Surgeon: Izora Gala, MD;  Location: [facility];  Service: ENT;  Laterality: Bilateral;    Allergies as of 12/21/2019   No Known Allergies     Medication List    Clenpiq 10-3.5-12 MG-GM -GM/160ML Soln Generic drug: Sod Picosulfate-Mag Ox-Cit Acd Take 1 kit by mouth once for 1 dose. Started by: Lavena Bullion, DO   fluticasone  50 MCG/ACT nasal spray Commonly known as: FLONASE SPRAY TWO SPRAYS IN EACH NOSTRIL ONCE DAILY.  Needs ov before any more refills.   glucose blood test strip Commonly known as: OneTouch Verio Check blood sugars once daily and as needed.   loratadine 10 MG tablet Commonly known as: CLARITIN Take 1 tablet (10 mg total) by mouth daily.   metoprolol 200 MG 24 hr tablet Commonly known as: TOPROL-XL TAKE 1 TABLET BY MOUTH DAILY   OneTouch Delica Lancets 03E Misc Check blood sugars once daily and as needed.   Rybelsus 3 MG Tabs Generic drug: Semaglutide Take 1 tablet by mouth daily.       Review of  systems negative except as noted in HPI / PMHx or noted below:  Review of Systems  Constitutional: Negative.   HENT: Negative.   Eyes: Negative.   Respiratory: Negative.   Cardiovascular: Negative.   Gastrointestinal: Negative.   Genitourinary: Negative.   Musculoskeletal: Negative.   Skin: Negative.   Neurological: Negative.   Endo/Heme/Allergies: Negative.   Psychiatric/Behavioral: Negative.     Family History  Problem Relation Age of Onset  . Diabetes Mother   . Allergies Mother   . Hyperlipidemia Father   . Hypertension Father   . Diabetes Father   . Anesthesia problems Neg Hx   . Hypotension Neg Hx   . Malignant hyperthermia Neg Hx   . Pseudochol deficiency Neg Hx   . Colon cancer Neg Hx   . Esophageal cancer Neg Hx     Social History   Socioeconomic History  . Marital status: Single    Spouse name: Not on file  . Number of children: 0  . Years of education: Not on file  . Highest education level: Not on file  Occupational History  . Occupation: Contractor: [employer]  Tobacco Use  . Smoking status: Current Some Day Smoker    Packs/day: 0.50    Years: 6.00    Pack years: 3.00    Types: Cigars    Last attempt to quit: 11/02/2010    Years since quitting: 9.1  . Smokeless tobacco: Never Used  Substance and Sexual Activity  . Alcohol use: Yes    Comment: ocassionally   . Drug use: No  . Sexual activity: Not on file  Other Topics Concern  . Not on file  Social History Narrative  . Not on file    Environmental and Social history  Lives in a house with a dry environment, dog look inside the household, carpet in the bedroom, plastic on the bed, plastic on the pillow, and no smoking ongoing inside the household.  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  Objective:   Vitals:   12/21/19 1448  BP: 136/86  Pulse: 86  Resp: (!) 22  SpO2: 92%   Height: 5' 11.5" (181.6 cm) Weight: (!) 394 lb (178.7 kg)  Physical  Exam Constitutional:      Appearance: He is not diaphoretic.  HENT:     Head: Normocephalic. No right periorbital erythema or left periorbital erythema.     Right Ear: Tympanic membrane, ear canal and external ear normal.     Left Ear: Tympanic membrane, ear canal and external ear normal.     Nose: Mucosal edema present. No rhinorrhea.     Mouth/Throat:     Pharynx: Uvula midline. No oropharyngeal exudate.  Eyes:     General: Lids are normal.  Conjunctiva/sclera: Conjunctivae normal.     Pupils: Pupils are equal, round, and reactive to light.  Neck:     Thyroid: No thyromegaly.     Trachea: Trachea normal. No tracheal tenderness or tracheal deviation.  Cardiovascular:     Rate and Rhythm: Normal rate and regular rhythm.     Heart sounds: Normal heart sounds, S1 normal and S2 normal. No murmur.  Pulmonary:     Effort: Pulmonary effort is normal. No respiratory distress.     Breath sounds: Normal breath sounds. No stridor. No wheezing or rales.  Chest:     Chest wall: No tenderness.  Abdominal:     General: There is no distension.     Palpations: Abdomen is soft. There is no mass.     Tenderness: There is no abdominal tenderness. There is no guarding or rebound.  Musculoskeletal:        General: No tenderness.  Lymphadenopathy:     Head:     Right side of head: No tonsillar adenopathy.     Left side of head: No tonsillar adenopathy.     Cervical: No cervical adenopathy.  Skin:    Coloration: Skin is not pale.     Findings: No erythema or rash.     Nails: There is no clubbing.  Neurological:     Mental Status: He is alert.     Diagnostics: Allergy skin tests were performed.  He demonstrated hypersensitivity to dog.  Spirometry was performed and demonstrated an FEV1 of 2.69 @ 71 % of predicted. FEV1/FVC = 0.85.  Following administration of inhaled albuterol his FEV1 did not improve.  The patient had an Asthma Control Test with the following results:  .    Results of  a head CT scan obtained 16 January 2013 identified the following:   Limited visualization of the paranasal sinuses demonstrates near complete opacification of the right maxillary sinus.  There is polypoid mucosal thickening within the left maxillary sinus.  There is diffuse thinning of the ethmoidal air cells.  There is scattered opacification in the left frontal sinuses.  No air fluid levels. Bilateral mastoid air cells are normal.  Results of a chest x-ray obtained 15 Dec 2016 identified the following:  Cardiomediastinal silhouette is normal. No pleural effusions or focal consolidations. Trachea projects midline and there is no pneumothorax. Soft tissue planes and included osseous structures are non-suspicious. Large body habitus. Mild degenerative change of thoracic spine.  Results of blood tests obtained 02 Dec 2017 identified WBC 10.9, absolute eosinophil 300, absolute basophil 200, absolute lymphocyte 3400, hemoglobin 14.3, platelet 249.  Assessment and Plan:    1. Asthma, moderate persistent, well-controlled   2. Perennial allergic rhinitis   3. Chronic pansinusitis   4. Nasal polyposis   5. Other eosinophilia   6. Basophilia   7. Current use of beta blocker     1.  Allergen avoidance measures - dog  2.  Every night use the following medications before placing CPAP:   A. OTC Afrin - single spray single nostril. Alternate every other day  B. Flonase - 2 sprays each nostril  C. Azelastine - 1 spray each nostril  3.  Every day use the following medication:   A. AirDuo 113 Digihaler - 1 inhalation 2 times per day  4. Treat and prevent headaches:   A. Slowly consolidate all forms of caffeine  B. Eliminate analgesics (naprosyn)   5. If needed:   A. Proair HFA Digihaler - 2 inhalations every 4-6  hours  B. Nasal saline  C. loratadine  6. Metoprolol????  7. Blood - CBC w/d, Area 2 aeroallergen profile  8. Return to clinic in 4 weeks or earlier if problem  Jack Thomas  appears to have inflammation not just of his upper airway but lower airway and we will treat him with a collection of anti-inflammatory agents as noted above.  His upper airway disease is interfering with the ability of his CPAP to give him adequate therapy directed against his sleep apnea and will have him utilize the combination of therapy noted above which does include a low-dose nasal decongestant.  He should not develop significant rebound effect while using this agent with a nasal steroid.  He also appears to have very significant headaches and whether this is secondary to his inadequately treated sleep apnea or because of his significant caffeine use or analgesic use is still an open question.  He is also using a relatively high dose of metoprolol which may be affecting some of his breathing and we may need to approach this issue if he does not respond adequately to the therapy noted above when he returns to this clinic in 4 weeks.  Finally, he does have documented basophilia on a previous CBC with differential and we need to follow-up to make sure that that basophilia has resolved or he may require evaluation for CML.  Jiles Prows, MD Allergy / Immunology [facility]

## 2019-12-25 ENCOUNTER — Encounter: Payer: Self-pay | Admitting: Allergy and Immunology

## 2019-12-25 MED ORDER — METOPROLOL SUCCINATE ER 200 MG PO TB24: 200.0000 mg | ORAL_TABLET | Freq: Every day | ORAL | 1 refills | Status: DC

## 2019-12-25 MED ORDER — LOSARTAN POTASSIUM 50 MG PO TABS: 50.0000 mg | ORAL_TABLET | Freq: Every day | ORAL | 1 refills | Status: DC

## 2020-01-07 ENCOUNTER — Encounter: Payer: Self-pay | Admitting: Family Medicine

## 2020-01-07 ENCOUNTER — Encounter: Payer: Self-pay | Admitting: Specialist

## 2020-01-08 ENCOUNTER — Other Ambulatory Visit (HOSPITAL_COMMUNITY): Payer: Managed Care, Other (non HMO)

## 2020-01-08 NOTE — Telephone Encounter (Signed)
According to his med list he should be taking losartan and metoprolol

## 2020-01-09 NOTE — Telephone Encounter (Signed)
I must have misunderstood

## 2020-01-09 NOTE — Telephone Encounter (Signed)
I must have misunderstood him I thought it was d/c ----  He should con't that one too

## 2020-01-10 ENCOUNTER — Other Ambulatory Visit: Payer: Self-pay

## 2020-01-10 DIAGNOSIS — I10 Essential (primary) hypertension: Secondary | ICD-10-CM

## 2020-01-11 ENCOUNTER — Ambulatory Visit: Payer: Managed Care, Other (non HMO) | Admitting: Family Medicine

## 2020-01-11 DIAGNOSIS — Z0289 Encounter for other administrative examinations: Secondary | ICD-10-CM

## 2020-01-12 ENCOUNTER — Encounter: Payer: Self-pay | Admitting: Family Medicine

## 2020-01-17 ENCOUNTER — Other Ambulatory Visit: Payer: Self-pay | Admitting: Family Medicine

## 2020-01-17 DIAGNOSIS — I10 Essential (primary) hypertension: Secondary | ICD-10-CM

## 2020-01-18 ENCOUNTER — Encounter: Payer: Self-pay | Admitting: Allergy and Immunology

## 2020-01-18 ENCOUNTER — Other Ambulatory Visit: Payer: Self-pay

## 2020-01-18 ENCOUNTER — Ambulatory Visit (INDEPENDENT_AMBULATORY_CARE_PROVIDER_SITE_OTHER): Payer: Managed Care, Other (non HMO) | Admitting: Allergy and Immunology

## 2020-01-18 VITALS — BP 124/84 | HR 80 | Resp 20

## 2020-01-18 DIAGNOSIS — J3089 Other allergic rhinitis: Secondary | ICD-10-CM | POA: Diagnosis not present

## 2020-01-18 DIAGNOSIS — J4540 Moderate persistent asthma, uncomplicated: Secondary | ICD-10-CM | POA: Diagnosis not present

## 2020-01-18 DIAGNOSIS — D72824 Basophilia: Secondary | ICD-10-CM

## 2020-01-18 NOTE — Patient Instructions (Addendum)
  1.  Allergen avoidance measures - dog  2.  Every night use the following medications before placing CPAP:   A. OTC Afrin - single spray single nostril. Alternate every other day  B. Flonase - 2 sprays each nostril  C. Azelastine - 1 spray each nostril  3.  Every day use the following medication:   A. AirDuo 113 Digihaler - 1 inhalation 1-2 times per day depending on asthma activity  4. If needed:   A. Proair HFA Digihaler - 2 inhalations every 4-6 hours  B. Nasal saline  C. loratadine  5. Return to clinic in 6 months or earlier if problem  6.  Have primary care doctor check blood for basophilia.

## 2020-01-18 NOTE — Progress Notes (Signed)
[facility]   Follow-up Note  Referring Provider: Ann Thomas, * Primary Provider: Carollee Thomas, Jack Apa, DO Date of Office Visit: 01/18/2020  Subjective:   Jack Thomas (DOB: [birth year]) is a 44 y.o. male who returns to the [facility] on 01/18/2020 in re-evaluation of the following:  HPI: Jack Thomas returns to this clinic in reevaluation of his allergic rhinitis and asthma and headache and sleep apnea.  His last visit to this clinic was his initial evaluation of 21 Dec 2019 at which point in time we addressed each issue.  He has no problems with his nose.  His nasal congestion has resolved.  He has not had any issues with his CPAP machine.  He can now utilize this device without any difficulty as all of his congestion has resolved.  His wheezing and coughing and shortness of breath is much better.  He is currently using his combination inhaler only 1 time per day and feels as though he is receiving very good effect from this treatment and is very satisfied with the response that he is received from this treatment.  He rarely uses a short acting bronchodilator.  He is working hard at work with lots of physical activity.  His headaches have completely resolved.  He has eliminated all caffeine consumption.  He has received 2 Moderna Covid vaccinations.  He did not obtain the requested blood tests in investigation of his atopic disease and his previously documented basophilia.  Allergies as of 01/18/2020   No Known Allergies     Medication List    AirDuo Digihaler 113-14 MCG/ACT Aepb Generic drug: Fluticasone-Salmeterol(sensor) Inhale 1 puff into the lungs twice daily. Rinse, gargle and spit after use.   azelastine 0.1 % nasal spray Commonly known as: ASTELIN Use 1 spray in each nostril every night before CPAP use.   chlorthalidone 25 MG tablet Commonly known as: HYGROTON Take 1 tablet (25 mg total) by mouth  daily.   fluticasone 50 MCG/ACT nasal spray Commonly known as: FLONASE Use 2 sprays in each nostril each night before CPAP use   glucose blood test strip Commonly known as: OneTouch Verio Check blood sugars once daily and as needed.   losartan 50 MG tablet Commonly known as: COZAAR Take 1 tablet (50 mg total) by mouth daily.   metoprolol 200 MG 24 hr tablet Commonly known as: TOPROL-XL Take 1 tablet (200 mg total) by mouth daily.   OneTouch Delica Lancets 40J Misc Check blood sugars once daily and as needed.   ProAir Digihaler 108 (90 Base) MCG/ACT Aepb Generic drug: Albuterol Sulfate (sensor) Inhale 2 puffs into the lungs every 4-6 hours as needed for cough, wheeze, shortness of breath or chest tightness.   Rybelsus 3 MG Tabs Generic drug: Semaglutide Take 1 tablet by mouth daily.       Past Medical History:  Diagnosis Date  . Arthritis    right knee; left shoulder  . Diabetes mellitus without complication ([place])   . Hyperlipidemia   . Hypertension    takes metoprolol daily  . Sleep apnea    sleep study - recommended cpap    Past Surgical History:  Procedure Laterality Date  . KNEE SURGERY  2009   right  . NASAL SEPTOPLASTY W/ TURBINOPLASTY  12/03/2011   Procedure: NASAL SEPTOPLASTY WITH TURBINATE REDUCTION;  Surgeon: Jack Gala, MD;  Location: [facility];  Service: ENT;  Laterality: Bilateral;  . NASAL SINUS SURGERY  12/03/2011   Procedure: ENDOSCOPIC SINUS SURGERY;  Surgeon: Jack Gala, MD;  Location: [facility];  Service: ENT;  Laterality: Bilateral;    Review of systems negative except as noted in HPI / PMHx or noted below:  Review of Systems  Constitutional: Negative.   HENT: Negative.   Eyes: Negative.   Respiratory: Negative.   Cardiovascular: Negative.   Gastrointestinal: Negative.   Genitourinary: Negative.   Musculoskeletal: Negative.   Skin: Negative.   Neurological: Negative.   Endo/Heme/Allergies: Negative.   Psychiatric/Behavioral: Negative.       Objective:   Vitals:   01/18/20 1140  BP: 124/84  Pulse: 80  Resp: 20  SpO2: 93%          Physical Exam Constitutional:      Appearance: He is not diaphoretic.  HENT:     Head: Normocephalic.     Right Ear: Tympanic membrane, ear canal and external ear normal.     Left Ear: Tympanic membrane, ear canal and external ear normal.     Nose: Nose normal. No mucosal edema or rhinorrhea.     Mouth/Throat:     Pharynx: Uvula midline. No oropharyngeal exudate.  Eyes:     Conjunctiva/sclera: Conjunctivae normal.  Neck:     Thyroid: No thyromegaly.     Trachea: Trachea normal. No tracheal tenderness or tracheal deviation.  Cardiovascular:     Rate and Rhythm: Normal rate and regular rhythm.     Heart sounds: Normal heart sounds, S1 normal and S2 normal. No murmur heard.   Pulmonary:     Effort: No respiratory distress.     Breath sounds: Normal breath sounds. No stridor. No wheezing or rales.  Lymphadenopathy:     Head:     Right side of head: No tonsillar adenopathy.     Left side of head: No tonsillar adenopathy.     Cervical: No cervical adenopathy.  Skin:    Findings: No erythema or rash.     Nails: There is no clubbing.  Neurological:     Mental Status: He is alert.     Diagnostics:    Spirometry was performed and demonstrated an FEV1 of 3.0 at 79 % of predicted.  Assessment and Plan:   1. Asthma, moderate persistent, well-controlled   2. Perennial allergic rhinitis   3. Basophilia     1.  Allergen avoidance measures - dog  2.  Every night use the following medications before placing CPAP:   A. OTC Afrin - single spray single nostril. Alternate every other day  B. Flonase - 2 sprays each nostril  C. Azelastine - 1 spray each nostril  3.  Every day use the following medication:   A. AirDuo 113 Digihaler - 1 inhalation 1-2 times per day depending on asthma activity  4. If needed:   A. Proair HFA Digihaler - 2 inhalations every 4-6 hours  B.  Nasal saline  C. loratadine  5. Return to clinic in 6 months or earlier if problem  6.  Have primary care doctor check blood for basophilia.  Jack Thomas has had a very good response to his current medical therapy and he will continue to utilize this plan and I will see him back in his clinic in 6 months or earlier if there is a problem.  He can follow-up with his primary care doctor regarding his previously documented basophilia.  Allena Katz, MD Allergy / Immunology [place]

## 2020-01-21 ENCOUNTER — Other Ambulatory Visit: Payer: Self-pay | Admitting: Family Medicine

## 2020-01-21 DIAGNOSIS — I10 Essential (primary) hypertension: Secondary | ICD-10-CM

## 2020-01-22 ENCOUNTER — Encounter: Payer: Self-pay | Admitting: Allergy and Immunology

## 2020-02-06 ENCOUNTER — Other Ambulatory Visit: Payer: Self-pay | Admitting: Family Medicine

## 2020-02-12 ENCOUNTER — Telehealth: Payer: Self-pay | Admitting: Gastroenterology

## 2020-02-12 ENCOUNTER — Other Ambulatory Visit (HOSPITAL_COMMUNITY): Payer: Managed Care, Other (non HMO)

## 2020-02-12 NOTE — Telephone Encounter (Signed)
Notified patient to let him know that his colonoscopy for 02/14/20 at [facility] has been cancelled due to a high deductible.

## 2020-02-14 ENCOUNTER — Ambulatory Visit (HOSPITAL_COMMUNITY)
Admission: RE | Admit: 2020-02-14 | Payer: Managed Care, Other (non HMO) | Source: Home / Self Care | Admitting: Gastroenterology

## 2020-02-14 SURGERY — COLONOSCOPY WITH PROPOFOL
Anesthesia: Monitor Anesthesia Care

## 2020-02-26 ENCOUNTER — Other Ambulatory Visit: Payer: Self-pay | Admitting: Family Medicine

## 2020-02-26 DIAGNOSIS — I10 Essential (primary) hypertension: Secondary | ICD-10-CM

## 2020-03-09 ENCOUNTER — Other Ambulatory Visit: Payer: Self-pay | Admitting: Family Medicine

## 2020-03-11 ENCOUNTER — Other Ambulatory Visit: Payer: Self-pay | Admitting: Family Medicine

## 2020-03-11 DIAGNOSIS — I10 Essential (primary) hypertension: Secondary | ICD-10-CM

## 2020-04-03 ENCOUNTER — Other Ambulatory Visit: Payer: Self-pay | Admitting: Family Medicine

## 2020-04-03 DIAGNOSIS — I10 Essential (primary) hypertension: Secondary | ICD-10-CM

## 2020-04-18 ENCOUNTER — Other Ambulatory Visit: Payer: Self-pay | Admitting: Family Medicine

## 2020-04-18 DIAGNOSIS — I10 Essential (primary) hypertension: Secondary | ICD-10-CM

## 2020-05-13 ENCOUNTER — Encounter: Payer: Self-pay | Admitting: Family Medicine

## 2020-05-13 ENCOUNTER — Other Ambulatory Visit: Payer: Self-pay | Admitting: Family Medicine

## 2020-05-13 DIAGNOSIS — I10 Essential (primary) hypertension: Secondary | ICD-10-CM

## 2020-05-14 MED ORDER — TELMISARTAN 80 MG PO TABS: 80.0000 mg | ORAL_TABLET | Freq: Every day | ORAL | 0 refills | Status: DC

## 2020-05-14 NOTE — Telephone Encounter (Signed)
Medication marked as last being ordered yesterday by provider.

## 2020-05-17 ENCOUNTER — Encounter: Payer: Self-pay | Admitting: Family Medicine

## 2020-05-17 ENCOUNTER — Ambulatory Visit: Payer: Managed Care, Other (non HMO) | Admitting: Family Medicine

## 2020-05-17 ENCOUNTER — Other Ambulatory Visit: Payer: Self-pay

## 2020-05-17 VITALS — BP 122/80 | HR 107 | Temp 98.1°F | Resp 20 | Ht 73.0 in | Wt 362.4 lb

## 2020-05-17 DIAGNOSIS — E1165 Type 2 diabetes mellitus with hyperglycemia: Secondary | ICD-10-CM | POA: Diagnosis not present

## 2020-05-17 DIAGNOSIS — I10 Essential (primary) hypertension: Secondary | ICD-10-CM | POA: Diagnosis not present

## 2020-05-17 DIAGNOSIS — Z1159 Encounter for screening for other viral diseases: Secondary | ICD-10-CM

## 2020-05-17 DIAGNOSIS — E1169 Type 2 diabetes mellitus with other specified complication: Secondary | ICD-10-CM

## 2020-05-17 DIAGNOSIS — E785 Hyperlipidemia, unspecified: Secondary | ICD-10-CM

## 2020-05-17 MED ORDER — MONTELUKAST SODIUM 10 MG PO TABS: 10.0000 mg | ORAL_TABLET | Freq: Every day | ORAL | 3 refills | Status: DC

## 2020-05-17 MED ORDER — RYBELSUS 7 MG PO TABS: 1.0000 | ORAL_TABLET | Freq: Every day | ORAL | 2 refills | Status: DC

## 2020-05-17 NOTE — Assessment & Plan Note (Signed)
Lost 20 lbs --- con't with diet and exercise

## 2020-05-17 NOTE — Progress Notes (Signed)
Patient ID: Jack Thomas, male    DOB: 1975/12/13  Age: 44 y.o. MRN: [id number]    Subjective:  Subjective  HPI Jack Thomas presents for f/u dm, chol and bp    HYPERTENSION   Blood pressure range-not checking   Chest pain- no      Dyspnea- no Lightheadedness- no   Edema- no  Other side effects - no   Medication compliance: good Low salt diet- yes    DIABETES    Blood Sugar ranges-not checking   Polyuria- no New Visual problems- no  Hypoglycemic symptoms- no  Other side effects-no Medication compliance - good Last eye exam- sept 2021 Foot exam- today   HYPERLIPIDEMIA  Medication compliance- good RUQ pain- no  Muscle aches- no Other side effects-no        Review of Systems  Constitutional: Negative for appetite change, diaphoresis, fatigue and unexpected weight change.  Eyes: Negative for pain, redness and visual disturbance.  Respiratory: Negative for cough, chest tightness, shortness of breath and wheezing.   Cardiovascular: Negative for chest pain, palpitations and leg swelling.  Endocrine: Negative for cold intolerance, heat intolerance, polydipsia, polyphagia and polyuria.  Genitourinary: Negative for difficulty urinating, dysuria and frequency.  Neurological: Negative for dizziness, light-headedness, numbness and headaches.    History Past Medical History:  Diagnosis Date  . Arthritis    right knee; left shoulder  . Diabetes mellitus without complication ([place])   . Hyperlipidemia   . Hypertension    takes metoprolol daily  . Sleep apnea    sleep study - recommended cpap    He has a past surgical history that includes Knee surgery (2009); Nasal sinus surgery (12/03/2011); and Nasal septoplasty w/ turbinoplasty (12/03/2011).   His family history includes Allergies in his mother; Diabetes in his father and mother; Hyperlipidemia in his father; Hypertension in his father.He reports that he has been smoking cigars. He has a 3.00 pack-year smoking history. He  has never used smokeless tobacco. He reports current alcohol use. He reports that he does not use drugs.  Current Outpatient Medications on File Prior to Visit  Medication Sig Dispense Refill  . AIRDUO DIGIHALER 113-14 MCG/ACT AEPB Inhale 1 puff into the lungs twice daily. Rinse, gargle and spit after use. (Patient taking differently: Inhale 1 puff into the lungs daily. Inhale 1 puff into the lungs daily. Rinse, gargle and spit after use.) 1 each 5  . amLODipine (NORVASC) 10 MG tablet TAKE 1 TABLET BY MOUTH DAILY **NEEDS APPOINTMENT FOR MORE REFILLS** 30 tablet 0  . azelastine (ASTELIN) 0.1 % nasal spray Use 1 spray in each nostril every night before CPAP use. (Patient taking differently: Place 1 spray into both nostrils at bedtime. Use 1 spray in each nostril every night before CPAP use.) 30 mL 5  . chlorthalidone (HYGROTON) 25 MG tablet TAKE ONE TABLET BY MOUTH DAILY 90 tablet 0  . fenofibrate 160 MG tablet TAKE ONE TABLET BY MOUTH DAILY 30 tablet 0  . fluticasone (FLONASE) 50 MCG/ACT nasal spray Use 2 sprays in each nostril each night before CPAP use (Patient taking differently: Place 2 sprays into both nostrils daily. ) 16 g 5  . furosemide (LASIX) 20 MG tablet TAKE ONE TABLET BY MOUTH DAILY 30 tablet 0  . gabapentin (NEURONTIN) 300 MG capsule Take 300 mg by mouth in the morning and at bedtime.    Marland Kitchen glucose blood (ONETOUCH VERIO) test strip Check blood sugars once daily and as needed. 100 each 12  . losartan (COZAAR)  50 MG tablet TAKE ONE TABLET BY MOUTH DAILY 30 tablet 1  . metoprolol (TOPROL-XL) 200 MG 24 hr tablet Take 1 tablet (200 mg total) by mouth daily. 90 tablet 1  . montelukast (SINGULAIR) 10 MG tablet TAKE ONE TABLET BY MOUTH AT BEDTIME 15 tablet 1  . ONETOUCH DELICA LANCETS 41L MISC Check blood sugars once daily and as needed. 100 each 12  . PROAIR DIGIHALER 108 MCG/ACT AEPB Inhale 2 puffs into the lungs every 4-6 hours as needed for cough, wheeze, shortness of breath or chest  tightness. 1 each 1  . telmisartan (MICARDIS) 80 MG tablet Take 1 tablet (80 mg total) by mouth daily. 30 tablet 0   Current Facility-Administered Medications on File Prior to Visit  Medication Dose Route Frequency Provider Last Rate Last Admin  . dexamethasone (DECADRON) injection 15 mg  15 mg Other Once Magnus Sinning, MD         Objective:  Objective  Physical Exam Vitals and nursing note reviewed.  Constitutional:      General: He is sleeping.     Appearance: He is well-developed.  HENT:     Head: Normocephalic and atraumatic.  Eyes:     Pupils: Pupils are equal, round, and reactive to light.  Neck:     Thyroid: No thyromegaly.  Cardiovascular:     Rate and Rhythm: Normal rate and regular rhythm.     Heart sounds: No murmur heard.   Pulmonary:     Effort: Pulmonary effort is normal. No respiratory distress.     Breath sounds: Normal breath sounds. No wheezing or rales.  Chest:     Chest wall: No tenderness.  Musculoskeletal:        General: No tenderness.     Cervical back: Normal range of motion and neck supple.  Skin:    General: Skin is warm and dry.  Neurological:     Mental Status: He is oriented to person, place, and time.  Psychiatric:        Behavior: Behavior normal.        Thought Content: Thought content normal.        Judgment: Judgment normal.    Diabetic Foot Exam - Simple   Simple Foot Form Diabetic Foot exam was performed with the following findings: Yes 05/17/2020  1:23 PM  Visual Inspection No deformities, no ulcerations, no other skin breakdown bilaterally: Yes Sensation Testing Intact to touch and monofilament testing bilaterally: Yes Pulse Check Posterior Tibialis and Dorsalis pulse intact bilaterally: Yes Comments     BP 122/80 (BP Location: Right Arm, Patient Position: Sitting, Cuff Size: Large)   Pulse (!) 107   Temp 98.1 F (36.7 C) (Oral)   Resp 20   Ht 6\' 1"  (1.854 m)   Wt (!) 362 lb 6.4 oz (164.4 kg)   SpO2 95%   BMI  47.81 kg/m  Wt Readings from Last 3 Encounters:  05/17/20 (!) 362 lb 6.4 oz (164.4 kg)  12/21/19 (!) 394 lb (178.7 kg)  12/21/19 (!) 394 lb 4 oz (178.8 kg)     Lab Results  Component Value Date   WBC 10.9 (H) 12/02/2017   HGB 14.3 12/02/2017   HCT 42.7 12/02/2017   PLT 249.0 12/02/2017   GLUCOSE 289 (H) 11/27/2019   CHOL 201 (H) 11/27/2019   TRIG 288.0 (H) 11/27/2019   HDL 34.80 (L) 11/27/2019   LDLDIRECT 119.0 11/27/2019   LDLCALC 68 03/05/2017   ALT 25 11/27/2019   AST 24 11/27/2019  NA 137 11/27/2019   K 3.7 11/27/2019   CL 99 11/27/2019   CREATININE 0.84 11/27/2019   BUN 10 11/27/2019   CO2 33 (H) 11/27/2019   TSH 1.56 11/27/2019   INR 0.93 01/16/2013   HGBA1C 8.9 (H) 11/27/2019   MICROALBUR 1.2 06/10/2017    DG Foot Complete Right  Result Date: 05/01/2018 CLINICAL DATA:  Right foot pain. EXAM: RIGHT FOOT COMPLETE - 3+ VIEW COMPARISON:  None. FINDINGS: There is a comminuted fracture of the base of the first distal phalanx with articular surface involvement and without significant displacement or angulation. There is no evidence of arthropathy or other focal bone abnormality. Soft tissues are unremarkable. IMPRESSION: Comminuted fracture of the base of the first distal phalanx with articular surface involvement and without significant displacement or angulation. Electronically Signed   By: Kathreen Devoid   On: 05/01/2018 11:52     Assessment & Plan:  Plan  I have discontinued Allyson Sabal Rybelsus. I am also having him start on Rybelsus. Additionally, I am having him maintain his glucose blood, OneTouch Delica Lancets 71I, AirDuo Digihaler, fluticasone, azelastine, ProAir Digihaler, metoprolol, gabapentin, montelukast, losartan, furosemide, fenofibrate, chlorthalidone, amLODipine, and telmisartan. We will continue to administer dexamethasone.  Meds ordered this encounter  Medications  . Semaglutide (RYBELSUS) 7 MG TABS    Sig: Take 1 tablet by mouth daily.     Dispense:  30 tablet    Refill:  2    Problem List Items Addressed This Visit      Unprioritized   Diabetes mellitus type II, uncontrolled ([place]) - Primary   Relevant Medications   Semaglutide (RYBELSUS) 7 MG TABS   Other Relevant Orders   Lipid panel   Hemoglobin A1c   Comprehensive metabolic panel    Other Visit Diagnoses    Hyperlipidemia associated with type 2 diabetes mellitus ([place])       Relevant Medications   Semaglutide (RYBELSUS) 7 MG TABS   Other Relevant Orders   Lipid panel   Hemoglobin A1c   Comprehensive metabolic panel   Primary hypertension       Relevant Orders   Lipid panel   Hemoglobin A1c   Comprehensive metabolic panel   Need for hepatitis C screening test       Relevant Orders   Hepatitis C antibody      Follow-up: Return in about 6 months (around 11/15/2020), or if symptoms worsen or fail to improve, for hypertension, hyperlipidemia, diabetes II, annual exam, fasting.  Ann Held, DO

## 2020-05-17 NOTE — Assessment & Plan Note (Signed)
Well controlled, no changes to meds. Encouraged heart healthy diet such as the DASH diet and exercise as tolerated.  °

## 2020-05-17 NOTE — Assessment & Plan Note (Signed)
Encouraged heart healthy diet, increase exercise, avoid trans fats, consider a krill oil cap daily Check labs

## 2020-05-17 NOTE — Patient Instructions (Signed)
Carbohydrate Counting for Diabetes Mellitus, Adult  Carbohydrate counting is a method of keeping track of how many carbohydrates you eat. Eating carbohydrates naturally increases the amount of sugar (glucose) in the blood. Counting how many carbohydrates you eat helps keep your blood glucose within normal limits, which helps you manage your diabetes (diabetes mellitus). It is important to know how many carbohydrates you can safely have in each meal. This is different for every person. A diet and nutrition specialist (registered dietitian) can help you make a meal plan and calculate how many carbohydrates you should have at each meal and snack. Carbohydrates are found in the following foods:  Grains, such as breads and cereals.  Dried beans and soy products.  Starchy vegetables, such as potatoes, peas, and corn.  Fruit and fruit juices.  Milk and yogurt.  Sweets and snack foods, such as cake, cookies, candy, chips, and soft drinks. How do I count carbohydrates? There are two ways to count carbohydrates in food. You can use either of the methods or a combination of both. Reading "Nutrition Facts" on packaged food The "Nutrition Facts" list is included on the labels of almost all packaged foods and beverages in the [place] It includes:  The serving size.  Information about nutrients in each serving, including the grams (g) of carbohydrate per serving. To use the "Nutrition Facts":  Decide how many servings you will have.  Multiply the number of servings by the number of carbohydrates per serving.  The resulting number is the total amount of carbohydrates that you will be having. Learning standard serving sizes of other foods When you eat carbohydrate foods that are not packaged or do not include "Nutrition Facts" on the label, you need to measure the servings in order to count the amount of carbohydrates:  Measure the foods that you will eat with a food scale or measuring cup, if  needed.  Decide how many standard-size servings you will eat.  Multiply the number of servings by 15. Most carbohydrate-rich foods have about 15 g of carbohydrates per serving. ? For example, if you eat 8 oz (170 g) of strawberries, you will have eaten 2 servings and 30 g of carbohydrates (2 servings x 15 g = 30 g).  For foods that have more than one food mixed, such as soups and casseroles, you must count the carbohydrates in each food that is included. The following list contains standard serving sizes of common carbohydrate-rich foods. Each of these servings has about 15 g of carbohydrates:   hamburger bun or  English muffin.   oz (15 mL) syrup.   oz (14 g) jelly.  1 slice of bread.  1 six-inch tortilla.  3 oz (85 g) cooked rice or pasta.  4 oz (113 g) cooked dried beans.  4 oz (113 g) starchy vegetable, such as peas, corn, or potatoes.  4 oz (113 g) hot cereal.  4 oz (113 g) mashed potatoes or  of a large baked potato.  4 oz (113 g) canned or frozen fruit.  4 oz (120 mL) fruit juice.  4-6 crackers.  6 chicken nuggets.  6 oz (170 g) unsweetened dry cereal.  6 oz (170 g) plain fat-free yogurt or yogurt sweetened with artificial sweeteners.  8 oz (240 mL) milk.  8 oz (170 g) fresh fruit or one small piece of fruit.  24 oz (680 g) popped popcorn. Example of carbohydrate counting Sample meal  3 oz (85 g) chicken breast.  6 oz (170 g)   brown rice.  4 oz (113 g) corn.  8 oz (240 mL) milk.  8 oz (170 g) strawberries with sugar-free whipped topping. Carbohydrate calculation 1. Identify the foods that contain carbohydrates: ? Rice. ? Corn. ? Milk. ? Strawberries. 2. Calculate how many servings you have of each food: ? 2 servings rice. ? 1 serving corn. ? 1 serving milk. ? 1 serving strawberries. 3. Multiply each number of servings by 15 g: ? 2 servings rice x 15 g = 30 g. ? 1 serving corn x 15 g = 15 g. ? 1 serving milk x 15 g = 15 g. ? 1  serving strawberries x 15 g = 15 g. 4. Add together all of the amounts to find the total grams of carbohydrates eaten: ? 30 g + 15 g + 15 g + 15 g = 75 g of carbohydrates total. Summary  Carbohydrate counting is a method of keeping track of how many carbohydrates you eat.  Eating carbohydrates naturally increases the amount of sugar (glucose) in the blood.  Counting how many carbohydrates you eat helps keep your blood glucose within normal limits, which helps you manage your diabetes.  A diet and nutrition specialist (registered dietitian) can help you make a meal plan and calculate how many carbohydrates you should have at each meal and snack. This information is not intended to replace advice given to you by your health care provider. Make sure you discuss any questions you have with your health care provider. Document Revised: 02/11/2017 Document Reviewed: 01/01/2016 Elsevier Patient Education  2020 [employer]  

## 2020-05-17 NOTE — Assessment & Plan Note (Signed)
Check labs today hgba1c to be checked , minimize simple carbs. Increase exercise as tolerated. Continue current meds  

## 2020-05-20 ENCOUNTER — Other Ambulatory Visit: Payer: Self-pay

## 2020-05-20 DIAGNOSIS — E1165 Type 2 diabetes mellitus with hyperglycemia: Secondary | ICD-10-CM

## 2020-05-20 LAB — COMPREHENSIVE METABOLIC PANEL
AG Ratio: 1.3 (calc) (ref 1.0–2.5)
ALT: 18 U/L (ref 9–46)
AST: 22 U/L (ref 10–40)
Albumin: 4.1 g/dL (ref 3.6–5.1)
Alkaline phosphatase (APISO): 53 U/L (ref 36–130)
BUN: 16 mg/dL (ref 7–25)
CO2: 27 mmol/L (ref 20–32)
Calcium: 9.4 mg/dL (ref 8.6–10.3)
Chloride: 104 mmol/L (ref 98–110)
Creat: 1.02 mg/dL (ref 0.60–1.35)
Globulin: 3.2 g/dL (calc) (ref 1.9–3.7)
Glucose, Bld: 113 mg/dL — ABNORMAL HIGH (ref 65–99)
Potassium: 3.6 mmol/L (ref 3.5–5.3)
Sodium: 141 mmol/L (ref 135–146)
Total Bilirubin: 0.5 mg/dL (ref 0.2–1.2)
Total Protein: 7.3 g/dL (ref 6.1–8.1)

## 2020-05-20 LAB — LIPID PANEL
Cholesterol: 207 mg/dL — ABNORMAL HIGH (ref <200)
HDL: 35 mg/dL — ABNORMAL LOW (ref >=40)
LDL Cholesterol (Calc): 131 mg/dL (calc) — ABNORMAL HIGH
Non-HDL Cholesterol (Calc): 172 mg/dL (calc) — ABNORMAL HIGH (ref <130)
Total CHOL/HDL Ratio: 5.9 (calc) — ABNORMAL HIGH (ref <5.0)
Triglycerides: 265 mg/dL — ABNORMAL HIGH (ref <150)

## 2020-05-20 LAB — HEMOGLOBIN A1C
Hgb A1c MFr Bld: 6.6 % of total Hgb — ABNORMAL HIGH (ref <5.7)
Mean Plasma Glucose: 143 (calc)
eAG (mmol/L): 7.9 (calc)

## 2020-05-20 LAB — HEPATITIS C ANTIBODY
Hepatitis C Ab: NONREACTIVE
SIGNAL TO CUT-OFF: 0.03 (ref <1.00)

## 2020-05-20 MED ORDER — RYBELSUS 7 MG PO TABS: 1.0000 | ORAL_TABLET | Freq: Every day | ORAL | 2 refills | Status: DC

## 2020-05-20 MED ORDER — MONTELUKAST SODIUM 10 MG PO TABS: 10.0000 mg | ORAL_TABLET | Freq: Every day | ORAL | 3 refills | Status: DC

## 2020-05-21 ENCOUNTER — Other Ambulatory Visit: Payer: Self-pay | Admitting: Family Medicine

## 2020-05-22 ENCOUNTER — Other Ambulatory Visit: Payer: Self-pay | Admitting: Family Medicine

## 2020-05-22 DIAGNOSIS — E785 Hyperlipidemia, unspecified: Secondary | ICD-10-CM

## 2020-05-24 ENCOUNTER — Other Ambulatory Visit: Payer: Self-pay

## 2020-05-24 MED ORDER — ROSUVASTATIN CALCIUM 10 MG PO TABS
10.0000 mg | ORAL_TABLET | Freq: Every day | ORAL | 2 refills | Status: DC
Start: 2020-05-24 — End: 2020-05-27

## 2020-05-27 ENCOUNTER — Other Ambulatory Visit: Payer: Self-pay

## 2020-05-27 MED ORDER — ROSUVASTATIN CALCIUM 10 MG PO TABS: 10.0000 mg | ORAL_TABLET | Freq: Every day | ORAL | 2 refills | Status: DC

## 2020-06-19 ENCOUNTER — Other Ambulatory Visit: Payer: Self-pay | Admitting: Family Medicine

## 2020-06-19 DIAGNOSIS — I10 Essential (primary) hypertension: Secondary | ICD-10-CM

## 2020-07-02 ENCOUNTER — Other Ambulatory Visit: Payer: Self-pay | Admitting: Family Medicine

## 2020-07-30 ENCOUNTER — Other Ambulatory Visit: Payer: Self-pay | Admitting: Family Medicine

## 2020-07-30 DIAGNOSIS — I10 Essential (primary) hypertension: Secondary | ICD-10-CM

## 2020-08-06 ENCOUNTER — Encounter: Payer: Self-pay | Admitting: Family Medicine

## 2020-08-06 DIAGNOSIS — I10 Essential (primary) hypertension: Secondary | ICD-10-CM

## 2020-08-12 ENCOUNTER — Telehealth: Payer: Self-pay

## 2020-08-12 NOTE — Telephone Encounter (Signed)
PA initiated via Covermymeds; KEY: [id number]. PA approved.   [id number];Review Type:Prior Auth;Coverage Start Date:08/12/2020;Coverage End Date:08/12/2021;

## 2020-08-20 ENCOUNTER — Encounter: Payer: Managed Care, Other (non HMO) | Admitting: Family Medicine

## 2020-09-08 ENCOUNTER — Other Ambulatory Visit: Payer: Self-pay | Admitting: Family Medicine

## 2020-09-08 DIAGNOSIS — I10 Essential (primary) hypertension: Secondary | ICD-10-CM

## 2020-09-18 ENCOUNTER — Other Ambulatory Visit: Payer: Self-pay | Admitting: Family Medicine

## 2020-09-18 ENCOUNTER — Telehealth: Payer: Self-pay | Admitting: Family Medicine

## 2020-09-18 DIAGNOSIS — G4733 Obstructive sleep apnea (adult) (pediatric): Secondary | ICD-10-CM

## 2020-09-18 NOTE — Telephone Encounter (Signed)
Caller: Anderson Malta [facility])  Call back to (Tia care coordinator) [phone] ext [phone] Call back fax # [phone]  Due to the shorter of cpad machine they need the following supply  Need rx for cpad supply  -heater hose - head wrap  Also patient qualify for new machine -they need rx along with sleep study notes, demographics

## 2020-09-18 NOTE — Telephone Encounter (Signed)
Please advise. I do not see a sleep study

## 2020-09-18 NOTE — Telephone Encounter (Signed)
Pulmonary did all the testing --- he has not been there since 2017 ------ if the sleep study is in there you can send it but I believe he needs a f/u with pulm

## 2020-09-19 NOTE — Telephone Encounter (Signed)
Spoke with Tia. Advised that pt was seen by Pulmonary for sleep study and we didn't have notes to provide for a new CPAP andd advised that the Pt hasn't been seen by pulmonary since 2017.

## 2020-10-10 LAB — HM DIABETES EYE EXAM

## 2020-10-23 ENCOUNTER — Other Ambulatory Visit: Payer: Self-pay

## 2020-10-23 DIAGNOSIS — I10 Essential (primary) hypertension: Secondary | ICD-10-CM

## 2020-10-23 MED ORDER — AMLODIPINE BESYLATE 10 MG PO TABS: 10.0000 mg | ORAL_TABLET | Freq: Every day | ORAL | 2 refills | Status: DC

## 2020-10-25 ENCOUNTER — Telehealth: Payer: Self-pay | Admitting: Pharmacist

## 2020-10-25 ENCOUNTER — Encounter: Payer: Self-pay | Admitting: Family Medicine

## 2020-10-25 ENCOUNTER — Ambulatory Visit (INDEPENDENT_AMBULATORY_CARE_PROVIDER_SITE_OTHER): Payer: Managed Care, Other (non HMO) | Admitting: Family Medicine

## 2020-10-25 VITALS — BP 138/88 | HR 88 | Temp 98.1°F | Resp 20 | Ht 73.0 in | Wt 364.8 lb

## 2020-10-25 DIAGNOSIS — Z0000 Encounter for general adult medical examination without abnormal findings: Secondary | ICD-10-CM | POA: Diagnosis not present

## 2020-10-25 DIAGNOSIS — G8929 Other chronic pain: Secondary | ICD-10-CM | POA: Insufficient documentation

## 2020-10-25 DIAGNOSIS — J309 Allergic rhinitis, unspecified: Secondary | ICD-10-CM | POA: Diagnosis not present

## 2020-10-25 DIAGNOSIS — M25561 Pain in right knee: Secondary | ICD-10-CM | POA: Diagnosis not present

## 2020-10-25 DIAGNOSIS — I10 Essential (primary) hypertension: Secondary | ICD-10-CM

## 2020-10-25 DIAGNOSIS — J3089 Other allergic rhinitis: Secondary | ICD-10-CM

## 2020-10-25 DIAGNOSIS — E1165 Type 2 diabetes mellitus with hyperglycemia: Secondary | ICD-10-CM | POA: Diagnosis not present

## 2020-10-25 DIAGNOSIS — E785 Hyperlipidemia, unspecified: Secondary | ICD-10-CM

## 2020-10-25 DIAGNOSIS — M25562 Pain in left knee: Secondary | ICD-10-CM | POA: Insufficient documentation

## 2020-10-25 MED ORDER — CHLORTHALIDONE 25 MG PO TABS: 25.0000 mg | ORAL_TABLET | Freq: Every day | ORAL | 1 refills | Status: DC

## 2020-10-25 MED ORDER — AMLODIPINE BESYLATE 10 MG PO TABS: 10.0000 mg | ORAL_TABLET | Freq: Every day | ORAL | 3 refills | Status: DC

## 2020-10-25 MED ORDER — MONTELUKAST SODIUM 10 MG PO TABS: 10.0000 mg | ORAL_TABLET | Freq: Every day | ORAL | 3 refills | Status: DC

## 2020-10-25 MED ORDER — TRAMADOL HCL 50 MG PO TABS: 50.0000 mg | ORAL_TABLET | Freq: Three times a day (TID) | ORAL | 0 refills | Status: DC | PRN

## 2020-10-25 MED ORDER — METOPROLOL SUCCINATE ER 200 MG PO TB24: 200.0000 mg | ORAL_TABLET | Freq: Every day | ORAL | 0 refills | Status: DC

## 2020-10-25 NOTE — Telephone Encounter (Signed)
Asked by Dr Carollee Herter to see if patient would have coverage thru his [employer] plan for a Dex Com or other CGM.  Submitted and Pharmacist, community with DexCom for patient. May take up to 72 business hours to get verification.  Patient was notified that we are checking into coverage. Will check back in next week.

## 2020-10-25 NOTE — Patient Instructions (Signed)

## 2020-10-25 NOTE — Assessment & Plan Note (Signed)
Check labs  con't meds Will look into dexcom for pt

## 2020-10-25 NOTE — Assessment & Plan Note (Signed)
Ultram prn -- do not take when driving  con't brace F/u ortho

## 2020-10-25 NOTE — Assessment & Plan Note (Signed)
ghm utd Check lab Will try to get covid #3 today

## 2020-10-25 NOTE — Assessment & Plan Note (Signed)
con't meds Refill singulair today

## 2020-10-25 NOTE — Progress Notes (Signed)
Subjective:    Patient ID: Jack Thomas, male    DOB: 06/05/1976, 45 y.o.   MRN: [id number]  Chief Complaint  Patient presents with  . Annual Exam    Pt states fasting     HPI Patient is in today for cpe and f/u dm, chol and htn Pt also c/o R knee pain-- he fell off the catwalk at work -- he fell about 4 ft-- he said his R leg bent back wards and he is in a lot of pain to walk -- pain is in his R knee   HYPERTENSION   Blood pressure range-borderline   Chest pain- no      Dyspnea- no Lightheadedness- no   Edema- no  Other side effects - no   Medication compliance: good Low salt diet- yes    DIABETES    Blood Sugar ranges-good per pt -- norecord   Polyuria- no New Visual problems- no  Hypoglycemic symptoms- no  Other side effects-no Medication compliance - good Last eye exam- last week thurs Foot exam-  today   HYPERLIPIDEMIA  Medication compliance- good RUQ pain- no  Muscle aches- no Other side effects-no      Past Medical History:  Diagnosis Date  . Arthritis    right knee; left shoulder  . Diabetes mellitus without complication ([place])   . Hyperlipidemia   . Hypertension    takes metoprolol daily  . Sleep apnea    sleep study - recommended cpap    Past Surgical History:  Procedure Laterality Date  . KNEE SURGERY  2009   right  . NASAL SEPTOPLASTY W/ TURBINOPLASTY  12/03/2011   Procedure: NASAL SEPTOPLASTY WITH TURBINATE REDUCTION;  Surgeon: Izora Gala, MD;  Location: [facility];  Service: ENT;  Laterality: Bilateral;  . NASAL SINUS SURGERY  12/03/2011   Procedure: ENDOSCOPIC SINUS SURGERY;  Surgeon: Izora Gala, MD;  Location: [facility] OR;  Service: ENT;  Laterality: Bilateral;    Family History  Problem Relation Age of Onset  . Diabetes Mother   . Allergies Mother   . Hyperlipidemia Father   . Hypertension Father   . Diabetes Father   . Anesthesia problems Neg Hx   . Hypotension Neg Hx   . Malignant hyperthermia Neg Hx   . Pseudochol deficiency Neg Hx    . Colon cancer Neg Hx   . Esophageal cancer Neg Hx     Social History   Socioeconomic History  . Marital status: Single    Spouse name: Not on file  . Number of children: 0  . Years of education: Not on file  . Highest education level: Not on file  Occupational History  . Occupation: Contractor: [employer]  Tobacco Use  . Smoking status: Current Some Day Smoker    Packs/day: 0.50    Years: 6.00    Pack years: 3.00    Types: Cigars    Last attempt to quit: 11/02/2010    Years since quitting: 9.9  . Smokeless tobacco: Never Used  Vaping Use  . Vaping Use: Never used  Substance and Sexual Activity  . Alcohol use: Yes    Comment: ocassionally   . Drug use: No  . Sexual activity: Not on file  Other Topics Concern  . Not on file  Social History Narrative  . Not on file   Social Determinants of Health   Financial Resource Strain: Not on file  Food Insecurity: Not on file  Transportation Needs:  Not on file  Physical Activity: Not on file  Stress: Not on file  Social Connections: Not on file  Intimate Partner Violence: Not on file    Outpatient Medications Prior to Visit  Medication Sig Dispense Refill  . AIRDUO DIGIHALER 113-14 MCG/ACT AEPB Inhale 1 puff into the lungs twice daily. Rinse, gargle and spit after use. (Patient taking differently: Inhale 1 puff into the lungs daily. Inhale 1 puff into the lungs daily. Rinse, gargle and spit after use.) 1 each 5  . azelastine (ASTELIN) 0.1 % nasal spray Use 1 spray in each nostril every night before CPAP use. (Patient taking differently: Place 1 spray into both nostrils at bedtime. Use 1 spray in each nostril every night before CPAP use.) 30 mL 5  . fenofibrate 160 MG tablet Take 1 tablet (160 mg total) by mouth daily. 90 tablet 1  . fluticasone (FLONASE) 50 MCG/ACT nasal spray Use 2 sprays in each nostril each night before CPAP use (Patient taking differently: Place 2 sprays into both nostrils daily.) 16 g  5  . furosemide (LASIX) 20 MG tablet Take 1 tablet (20 mg total) by mouth daily. 90 tablet 1  . gabapentin (NEURONTIN) 300 MG capsule Take 300 mg by mouth in the morning and at bedtime.    Marland Kitchen glucose blood (ONETOUCH VERIO) test strip Check blood sugars once daily and as needed. 100 each 12  . losartan (COZAAR) 50 MG tablet TAKE ONE TABLET BY MOUTH DAILY 30 tablet 1  . ONETOUCH DELICA LANCETS 74J MISC Check blood sugars once daily and as needed. 100 each 12  . PROAIR DIGIHALER 108 MCG/ACT AEPB Inhale 2 puffs into the lungs every 4-6 hours as needed for cough, wheeze, shortness of breath or chest tightness. 1 each 1  . rosuvastatin (CRESTOR) 10 MG tablet Take 1 tablet (10 mg total) by mouth daily. 30 tablet 2  . Semaglutide (RYBELSUS) 7 MG TABS Take 1 tablet by mouth daily. 30 tablet 2  . telmisartan (MICARDIS) 80 MG tablet TAKE ONE TABLET BY MOUTH DAILY 90 tablet 0  . amLODipine (NORVASC) 10 MG tablet Take 1 tablet (10 mg total) by mouth daily. 30 tablet 2  . chlorthalidone (HYGROTON) 25 MG tablet Take 1 tablet (25 mg total) by mouth daily. 90 tablet 1  . metoprolol (TOPROL-XL) 200 MG 24 hr tablet TAKE ONE TABLET BY MOUTH DAILY 90 tablet 0  . montelukast (SINGULAIR) 10 MG tablet TAKE ONE TABLET BY MOUTH AT BEDTIME 30 tablet 2   Facility-Administered Medications Prior to Visit  Medication Dose Route Frequency Provider Last Rate Last Admin  . dexamethasone (DECADRON) injection 15 mg  15 mg Other Once Magnus Sinning, MD        No Known Allergies  ROS     Objective:    Physical Exam Constitutional:      General: He is not in acute distress.    Appearance: He is well-developed. He is not diaphoretic.  HENT:     Head: Normocephalic and atraumatic.     Right Ear: External ear normal.     Left Ear: External ear normal.     Nose: Nose normal.     Mouth/Throat:     Pharynx: No oropharyngeal exudate.  Eyes:     General:        Right eye: No discharge.        Left eye: No discharge.      Conjunctiva/sclera: Conjunctivae normal.     Pupils: Pupils are equal, round,  and reactive to light.  Neck:     Thyroid: No thyromegaly.     Vascular: No JVD.  Cardiovascular:     Rate and Rhythm: Normal rate and regular rhythm.     Heart sounds: No murmur heard. No friction rub. No gallop.   Pulmonary:     Effort: Pulmonary effort is normal. No respiratory distress.     Breath sounds: Normal breath sounds. No wheezing or rales.  Chest:     Chest wall: No tenderness.  Abdominal:     General: Bowel sounds are normal. There is no distension.     Palpations: Abdomen is soft. There is no mass.     Tenderness: There is no abdominal tenderness. There is no guarding or rebound.  Musculoskeletal:        General: Tenderness and signs of injury present.     Cervical back: Normal range of motion and neck supple.     Right knee: Swelling present. Decreased range of motion. Tenderness present over the PCL. No medial joint line tenderness.  Lymphadenopathy:     Cervical: No cervical adenopathy.  Skin:    General: Skin is warm and dry.     Coloration: Skin is not pale.     Findings: No erythema or rash.  Neurological:     Mental Status: He is alert and oriented to person, place, and time.     Motor: No abnormal muscle tone.     Deep Tendon Reflexes: Reflexes are normal and symmetric. Reflexes normal.  Psychiatric:        Behavior: Behavior normal.        Thought Content: Thought content normal.        Judgment: Judgment normal.     BP 138/88 (BP Location: Right Arm, Patient Position: Sitting, Cuff Size: Large)   Pulse 88   Temp 98.1 F (36.7 C) (Oral)   Resp 20   Ht 6\' 1"  (1.854 m)   Wt (!) 364 lb 12.8 oz (165.5 kg)   SpO2 95%   BMI 48.13 kg/m  Wt Readings from Last 3 Encounters:  10/25/20 (!) 364 lb 12.8 oz (165.5 kg)  05/17/20 (!) 362 lb 6.4 oz (164.4 kg)  12/21/19 (!) 394 lb (178.7 kg)    Diabetic Foot Exam - Simple   No data filed    Lab Results  Component Value Date    WBC 10.9 (H) 12/02/2017   HGB 14.3 12/02/2017   HCT 42.7 12/02/2017   PLT 249.0 12/02/2017   GLUCOSE 113 (H) 05/17/2020   CHOL 207 (H) 05/17/2020   TRIG 265 (H) 05/17/2020   HDL 35 (L) 05/17/2020   LDLDIRECT 119.0 11/27/2019   LDLCALC 131 (H) 05/17/2020   ALT 18 05/17/2020   AST 22 05/17/2020   NA 141 05/17/2020   K 3.6 05/17/2020   CL 104 05/17/2020   CREATININE 1.02 05/17/2020   BUN 16 05/17/2020   CO2 27 05/17/2020   TSH 1.56 11/27/2019   INR 0.93 01/16/2013   HGBA1C 6.6 (H) 05/17/2020   MICROALBUR 1.2 06/10/2017    Lab Results  Component Value Date   TSH 1.56 11/27/2019   Lab Results  Component Value Date   WBC 10.9 (H) 12/02/2017   HGB 14.3 12/02/2017   HCT 42.7 12/02/2017   MCV 95.5 12/02/2017   PLT 249.0 12/02/2017   Lab Results  Component Value Date   NA 141 05/17/2020   K 3.6 05/17/2020   CO2 27 05/17/2020   GLUCOSE 113 (H)  05/17/2020   BUN 16 05/17/2020   CREATININE 1.02 05/17/2020   BILITOT 0.5 05/17/2020   ALKPHOS 63 11/27/2019   AST 22 05/17/2020   ALT 18 05/17/2020   PROT 7.3 05/17/2020   ALBUMIN 4.0 11/27/2019   CALCIUM 9.4 05/17/2020   GFR 121.47 11/27/2019   Lab Results  Component Value Date   CHOL 207 (H) 05/17/2020   Lab Results  Component Value Date   HDL 35 (L) 05/17/2020   Lab Results  Component Value Date   LDLCALC 131 (H) 05/17/2020   Lab Results  Component Value Date   TRIG 265 (H) 05/17/2020   Lab Results  Component Value Date   CHOLHDL 5.9 (H) 05/17/2020   Lab Results  Component Value Date   HGBA1C 6.6 (H) 05/17/2020       Assessment & Plan:   Problem List Items Addressed This Visit      Unprioritized   Diabetes mellitus type II, uncontrolled ([place])   Relevant Orders   Comprehensive metabolic panel   Microalbumin / creatinine urine ratio   Hemoglobin A1c   Essential hypertension   Relevant Medications   chlorthalidone (HYGROTON) 25 MG tablet   metoprolol (TOPROL-XL) 200 MG 24 hr tablet    amLODipine (NORVASC) 10 MG tablet   Other Relevant Orders   CBC with Differential/Platelet   Lipid panel   TSH    Other Visit Diagnoses    Preventative health care    -  Primary   Relevant Orders   CBC with Differential/Platelet   Comprehensive metabolic panel   Microalbumin / creatinine urine ratio   Hemoglobin A1c   Lipid panel   PSA   TSH   Environmental and seasonal allergies       Relevant Medications   montelukast (SINGULAIR) 10 MG tablet   Acute pain of right knee       Relevant Medications   traMADol (ULTRAM) 50 MG tablet      I have changed Demarie Bracken's montelukast and metoprolol. I am also having him start on traMADol. Additionally, I am having him maintain his glucose blood, OneTouch Delica Lancets 16X, AirDuo Digihaler, fluticasone, azelastine, ProAir Digihaler, gabapentin, losartan, Rybelsus, rosuvastatin, fenofibrate, furosemide, telmisartan, chlorthalidone, and amLODipine. We will continue to administer dexamethasone.  Meds ordered this encounter  Medications  . chlorthalidone (HYGROTON) 25 MG tablet    Sig: Take 1 tablet (25 mg total) by mouth daily.    Dispense:  90 tablet    Refill:  1  . montelukast (SINGULAIR) 10 MG tablet    Sig: Take 1 tablet (10 mg total) by mouth at bedtime.    Dispense:  90 tablet    Refill:  3  . metoprolol (TOPROL-XL) 200 MG 24 hr tablet    Sig: Take 1 tablet (200 mg total) by mouth daily.    Dispense:  90 tablet    Refill:  0  . amLODipine (NORVASC) 10 MG tablet    Sig: Take 1 tablet (10 mg total) by mouth daily.    Dispense:  90 tablet    Refill:  3  . traMADol (ULTRAM) 50 MG tablet    Sig: Take 1 tablet (50 mg total) by mouth every 8 (eight) hours as needed for up to 5 days.    Dispense:  15 tablet    Refill:  0     Ann Held, DO

## 2020-10-25 NOTE — Assessment & Plan Note (Signed)
Well controlled, no changes to meds. Encouraged heart healthy diet such as the DASH diet and exercise as tolerated.  °

## 2020-10-25 NOTE — Assessment & Plan Note (Signed)
Encouraged heart healthy diet, increase exercise, avoid trans fats, consider a krill oil cap daily 

## 2020-10-26 ENCOUNTER — Encounter: Payer: Self-pay | Admitting: Family Medicine

## 2020-10-26 DIAGNOSIS — J3089 Other allergic rhinitis: Secondary | ICD-10-CM

## 2020-10-26 DIAGNOSIS — M25561 Pain in right knee: Secondary | ICD-10-CM

## 2020-10-26 DIAGNOSIS — I10 Essential (primary) hypertension: Secondary | ICD-10-CM

## 2020-10-26 LAB — CBC WITH DIFFERENTIAL/PLATELET
Absolute Monocytes: 845 cells/uL (ref 200–950)
Basophils Absolute: 92 cells/uL (ref 0–200)
Basophils Relative: 0.7 %
Eosinophils Absolute: 436 cells/uL (ref 15–500)
Eosinophils Relative: 3.3 %
HCT: 41.2 % (ref 38.5–50.0)
Hemoglobin: 13.7 g/dL (ref 13.2–17.1)
Lymphs Abs: 4330 cells/uL — ABNORMAL HIGH (ref 850–3900)
MCH: 31.5 pg (ref 27.0–33.0)
MCHC: 33.3 g/dL (ref 32.0–36.0)
MCV: 94.7 fL (ref 80.0–100.0)
MPV: 11.4 fL (ref 7.5–12.5)
Monocytes Relative: 6.4 %
Neutro Abs: 7498 cells/uL (ref 1500–7800)
Neutrophils Relative %: 56.8 %
Platelets: 280 10*3/uL (ref 140–400)
RBC: 4.35 10*6/uL (ref 4.20–5.80)
RDW: 11.2 % (ref 11.0–15.0)
Total Lymphocyte: 32.8 %
WBC: 13.2 10*3/uL — ABNORMAL HIGH (ref 3.8–10.8)

## 2020-10-26 LAB — LIPID PANEL
Cholesterol: 193 mg/dL (ref <200)
HDL: 34 mg/dL — ABNORMAL LOW (ref >=40)
LDL Cholesterol (Calc): 126 mg/dL (calc) — ABNORMAL HIGH
Non-HDL Cholesterol (Calc): 159 mg/dL (calc) — ABNORMAL HIGH (ref <130)
Total CHOL/HDL Ratio: 5.7 (calc) — ABNORMAL HIGH (ref <5.0)
Triglycerides: 211 mg/dL — ABNORMAL HIGH (ref <150)

## 2020-10-26 LAB — COMPREHENSIVE METABOLIC PANEL
AG Ratio: 1.4 (calc) (ref 1.0–2.5)
ALT: 16 U/L (ref 9–46)
AST: 20 U/L (ref 10–40)
Albumin: 4.1 g/dL (ref 3.6–5.1)
Alkaline phosphatase (APISO): 46 U/L (ref 36–130)
BUN: 18 mg/dL (ref 7–25)
CO2: 26 mmol/L (ref 20–32)
Calcium: 9.2 mg/dL (ref 8.6–10.3)
Chloride: 103 mmol/L (ref 98–110)
Creat: 1.11 mg/dL (ref 0.60–1.35)
Globulin: 3 g/dL (calc) (ref 1.9–3.7)
Glucose, Bld: 114 mg/dL — ABNORMAL HIGH (ref 65–99)
Potassium: 4 mmol/L (ref 3.5–5.3)
Sodium: 142 mmol/L (ref 135–146)
Total Bilirubin: 0.6 mg/dL (ref 0.2–1.2)
Total Protein: 7.1 g/dL (ref 6.1–8.1)

## 2020-10-26 LAB — MICROALBUMIN / CREATININE URINE RATIO
Creatinine, Urine: 172 mg/dL (ref 20–320)
Microalb Creat Ratio: 1 mcg/mg creat (ref <30)
Microalb, Ur: 0.2 mg/dL

## 2020-10-26 LAB — TSH: TSH: 1.58 mIU/L (ref 0.40–4.50)

## 2020-10-26 LAB — PSA: PSA: 0.29 ng/mL (ref <=4.0)

## 2020-10-26 LAB — HEMOGLOBIN A1C
Hgb A1c MFr Bld: 6.4 % of total Hgb — ABNORMAL HIGH (ref <5.7)
Mean Plasma Glucose: 137 mg/dL
eAG (mmol/L): 7.6 mmol/L

## 2020-10-28 MED ORDER — MONTELUKAST SODIUM 10 MG PO TABS: 10.0000 mg | ORAL_TABLET | Freq: Every day | ORAL | 3 refills | Status: DC

## 2020-10-28 MED ORDER — TRAMADOL HCL 50 MG PO TABS: 50.0000 mg | ORAL_TABLET | Freq: Three times a day (TID) | ORAL | 0 refills | Status: AC | PRN

## 2020-10-28 MED ORDER — METOPROLOL SUCCINATE ER 200 MG PO TB24: 200.0000 mg | ORAL_TABLET | Freq: Every day | ORAL | 0 refills | Status: DC

## 2020-10-28 MED ORDER — CHLORTHALIDONE 25 MG PO TABS: 25.0000 mg | ORAL_TABLET | Freq: Every day | ORAL | 1 refills | Status: DC

## 2020-10-28 MED ORDER — AMLODIPINE BESYLATE 10 MG PO TABS: 10.0000 mg | ORAL_TABLET | Freq: Every day | ORAL | 3 refills | Status: DC

## 2020-10-28 NOTE — Telephone Encounter (Signed)
Tramadol filled last week during visit. Sent to the wrong pharmacy.

## 2020-10-29 ENCOUNTER — Encounter: Payer: Self-pay | Admitting: Family Medicine

## 2020-10-30 ENCOUNTER — Other Ambulatory Visit: Payer: Self-pay

## 2020-10-30 ENCOUNTER — Other Ambulatory Visit: Payer: Self-pay | Admitting: Family Medicine

## 2020-10-30 DIAGNOSIS — E1169 Type 2 diabetes mellitus with other specified complication: Secondary | ICD-10-CM

## 2020-10-30 DIAGNOSIS — E1165 Type 2 diabetes mellitus with hyperglycemia: Secondary | ICD-10-CM

## 2020-10-30 DIAGNOSIS — E785 Hyperlipidemia, unspecified: Secondary | ICD-10-CM

## 2020-10-30 MED ORDER — ROSUVASTATIN CALCIUM 10 MG PO TABS: 10.0000 mg | ORAL_TABLET | Freq: Every day | ORAL | 2 refills | Status: DC

## 2020-10-31 ENCOUNTER — Telehealth: Payer: Managed Care, Other (non HMO)

## 2020-11-06 ENCOUNTER — Telehealth: Payer: Managed Care, Other (non HMO)

## 2020-11-13 ENCOUNTER — Ambulatory Visit: Payer: Managed Care, Other (non HMO) | Admitting: Pharmacist

## 2020-11-13 ENCOUNTER — Other Ambulatory Visit: Payer: Self-pay | Admitting: Family Medicine

## 2020-11-13 DIAGNOSIS — E785 Hyperlipidemia, unspecified: Secondary | ICD-10-CM

## 2020-11-13 DIAGNOSIS — I10 Essential (primary) hypertension: Secondary | ICD-10-CM

## 2020-11-13 DIAGNOSIS — E1165 Type 2 diabetes mellitus with hyperglycemia: Secondary | ICD-10-CM

## 2020-11-13 DIAGNOSIS — E1169 Type 2 diabetes mellitus with other specified complication: Secondary | ICD-10-CM

## 2020-11-13 MED ORDER — DEXCOM G6 SENSOR MISC: 3 refills | Status: DC

## 2020-11-13 MED ORDER — DEXCOM G6 TRANSMITTER MISC: 3 refills | Status: DC

## 2020-11-13 NOTE — Patient Instructions (Signed)
Visit Information  PATIENT GOALS: Goals Addressed            This Visit's Progress   . Medications Management        Diabetes: . Current treatment of Rybelsus 7mg  daily   . Interventions:  Educated on BG / diabetes goals Counseled on limiting intake of high CHO foods and increasing physical activity to goal of 150 minutes per week Collaborated with pharmacy, DexCom and PCP for CGM coverage .      Patient Actions:  o Call clinical pharmacist with questions or issues regarding CGM approval.  o Call when CGM / DEXCOM approved for education on how to use o Continue to take current medications for diabetes o Limit intake of high CHO foods and increasing physical activity to goal of 150 minutes per week  Hypertension: .  Current treatment:  o Telmisartan 80mg  daily  o Metoprolol 100mg  daiy  o Amlodipine 10mg  daily  o Chlorthalidone 25mg  daily  . Recommended continue current therapy for blood pressure and updated medication list .      Patient Actions:  o Continue to take current medications for blood pressure  Hyperlipidemia: . Uncontrolled - last LDL was 126 (10/24/20)  Initiated treatment with rosuvastatin 10mg  daily .       Patient Actions:  o Continue rosuvastatin 10mg  daily  o Limit intake of fried foods and foods high in saturated fat o Recheck lipids in 3 to 6 months  Patient Goals/Self-Care Activities . Over the next 180 days, patient will:  take medications as prescribed and check glucose daily , document, and provide at future appointments  Follow Up Plan: Telephone follow up appointment with care management team member scheduled for:  1 to 2 weeks to follow up on CGM approval        The patient verbalized understanding of instructions, educational materials, and care plan provided today and declined offer to receive copy of patient instructions, educational materials, and care plan.   Telephone follow up appointment with care management team member scheduled  for:  2 weeks   Cherre Robins, PharmD Clinical Pharmacist [facility] [phone]
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
## 2020-11-19 ENCOUNTER — Ambulatory Visit: Payer: Managed Care, Other (non HMO) | Admitting: Pharmacist

## 2020-11-19 DIAGNOSIS — E1165 Type 2 diabetes mellitus with hyperglycemia: Secondary | ICD-10-CM
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
## 2020-11-21 ENCOUNTER — Telehealth: Payer: Self-pay

## 2020-11-21 NOTE — Patient Instructions (Signed)
Visit Information  PATIENT GOALS: Goals Addressed            This Visit's Progress   . Medications Management   No change     Diabetes: . Current treatment of Rybelsus 7mg  daily   . Interventions:  Educated on BG / diabetes goals Counseled on limiting intake of high CHO foods and increasing physical activity to goal of 150 minutes per week Collaborated with pharmacy, DexCom and PCP for CGM coverage .      Patient Actions:  o Call clinical pharmacist with questions or issues regarding CGM approval.  o Call when CGM / DEXCOM approved for education on how to use o Continue to take current medications for diabetes o Limit intake of high CHO foods and increasing physical activity to goal of 150 minutes per week  Hypertension: .  Current treatment:  o Telmisartan 80mg  daily  o Metoprolol 100mg  daiy  o Amlodipine 10mg  daily  o Chlorthalidone 25mg  daily  . Recommended continue current therapy for blood pressure and updated medication list .      Patient Actions:  o Continue to take current medications for blood pressure  Hyperlipidemia: . Uncontrolled - last LDL was 126 (10/24/20)  Initiated treatment with rosuvastatin 10mg  daily .       Patient Actions:  o Continue rosuvastatin 10mg  daily  o Limit intake of fried foods and foods high in saturated fat o Recheck lipids in 3 to 6 months  Patient Goals/Self-Care Activities . Over the next 180 days, patient will:  take medications as prescribed and check glucose daily , document, and provide at future appointments  Follow Up Plan: Telephone follow up appointment with care management team member scheduled for:  1 to 2 weeks to follow up on CGM approval        The patient verbalized understanding of instructions, educational materials, and care plan provided today and declined offer to receive copy of patient instructions, educational materials, and care plan.   Telephone follow up appointment with care management team member  scheduled for: 11/28/2020 to check prior authorization for [place], PharmD Clinical Pharmacist [place] [facility]

## 2020-11-21 NOTE — Telephone Encounter (Signed)
PA started for [facility] Transmitter: [id number] - PA Case ID: [id number] - Rx #: [id number]  PA started for Dexcom Sensor: [id number] - PA Case ID: [id number] - Rx #: [id number]  Pt can also go to [email or web address] if denied to see about patient assistance.

## 2020-12-18 ENCOUNTER — Encounter: Payer: Self-pay | Admitting: Family Medicine

## 2020-12-19 ENCOUNTER — Ambulatory Visit: Payer: Managed Care, Other (non HMO) | Admitting: Pharmacist

## 2020-12-19 DIAGNOSIS — E1165 Type 2 diabetes mellitus with hyperglycemia: Secondary | ICD-10-CM

## 2020-12-19 DIAGNOSIS — I10 Essential (primary) hypertension: Secondary | ICD-10-CM

## 2020-12-19 DIAGNOSIS — E1169 Type 2 diabetes mellitus with other specified complication: Secondary | ICD-10-CM
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
## 2020-12-23 ENCOUNTER — Telehealth: Payer: Self-pay | Admitting: Pharmacist

## 2020-12-23 NOTE — Telephone Encounter (Signed)
Received My Chart messages below from patient. I spoke to [facility] pharmacist, April and she states patient now is considering not getting DexCom and maybe trying [employer]. The pharmacist that was working this weekend was fill in and did not know to send fill date at 12/13/20.  I spoke with patient and he has decided to change to [facility] since if it is not covered on his insurance the cost is lower that the DexCom.  Patient will come to office 12/27/20 for education about [facility] and will place a sample sensor. If he decides he likes it will send in Rx.          You  Tyson Alias 2 hours ago (8:44 AM)  TE I will call [facility] when they open today to see what I can do. I know when I spoke to the pharmacist last week she had to change the fill date to 12/13/20 since the insurance had approved the 1 month fill on that date. That might have been the problem. I am not sure why it was not run thru because she was filling it when I spoke to her last week, but maybe it somehow was reversed.   Shireen Rayburn   This MyChart message has not been read.   Blakeley, Margraf  You 2 days ago      Having problems getting the dexcom need doctor approval      Eddie, Payette  You 3 days ago  Sorry for getting back so late I talk to the pharmacy and I was going to try and get it today 12/20/20 please give a call back      You  Shanta, Dorvil 4 days ago    Hi Mr. Loberg,  So I spoke with [facility] and after they called your insurance it appears that the Dex Com prior authorization was denied but they allowed a 1 month approval for our office to appeal this decision. I did give the [facility] pharmacist the information for the discount card and with that and the 1 month approval through your insurance the cost of the DexCom transmitted will be $15 and 1 month of sensors would be $50.91 (the discount card only lowers the price by up to $140 per fill.   If the insurance dose  not approve the sensors, then the cost will increase to around $441 full cash price and then with discount card to $301.  We can proceed with this and get 1 month if you want but I hate for you to pay for the transmitter ($15) and then not be able to use after 1 month.  We can try for the appeal but I really don't think that they will approve if you are not using insulin.  We can check to see the cost of Freestyle [place] system which is usually <$100 per month.  Please let me know how you would like to proceed.   Benita Gutter, CMA routed conversation to You 5 days ago   Dewaine Conger, DO 5 days ago    My  Pharmacist said I have the [place] there waiting on me. I'm asking if you send me a discount on the [place]

## 2020-12-27 ENCOUNTER — Ambulatory Visit: Payer: Managed Care, Other (non HMO) | Admitting: Pharmacist

## 2020-12-27 ENCOUNTER — Encounter: Payer: Self-pay | Admitting: Family Medicine

## 2020-12-27 ENCOUNTER — Other Ambulatory Visit: Payer: Self-pay

## 2020-12-27 ENCOUNTER — Telehealth: Payer: Self-pay

## 2020-12-27 ENCOUNTER — Other Ambulatory Visit: Payer: Self-pay | Admitting: Family Medicine

## 2020-12-27 DIAGNOSIS — E1165 Type 2 diabetes mellitus with hyperglycemia: Secondary | ICD-10-CM

## 2020-12-27 DIAGNOSIS — I10 Essential (primary) hypertension: Secondary | ICD-10-CM

## 2020-12-27 NOTE — Telephone Encounter (Signed)
He should be on losartan or micardis -- not both

## 2020-12-27 NOTE — Patient Instructions (Signed)
Visit Information  PATIENT GOALS: Goals Addressed            This Visit's Progress   . Medications Management        Diabetes: . Current treatment of Rybelsus 7mg  daily   . Interventions:  Educated on BG / diabetes goals Counseled on limiting intake of high CHO foods and increasing physical activity to goal of 150 minutes per week Education provided for [employer] CGM; initiated CGM today with samples; .      Patient Actions:  o Call clinical pharmacist with questions or issues regarding CGM o Continue to take current medications for diabetes o Limit intake of high CHO foods and increasing physical activity to goal of 150 minutes per week  Hypertension: .  Current treatment:  o Telmisartan 80mg  daily  o Metoprolol 100mg  daiy  o Amlodipine 10mg  daily  o Chlorthalidone 25mg  daily  . Recommended continue current therapy for blood pressure and updated medication list .      Patient Actions:  o Continue to take current medications for blood pressure  Hyperlipidemia: . Uncontrolled - last LDL was 126 (10/24/20)  Initiated treatment with rosuvastatin 10mg  daily .       Patient Actions:  o Continue rosuvastatin 10mg  daily  o Limit intake of fried foods and foods high in saturated fat o Recheck lipids in 3 to 6 months  Patient Goals/Self-Care Activities . Over the next 180 days, patient will:  take medications as prescribed and check glucose daily , document, and provide at future appointments  Follow Up Plan: Telephone follow up appointment with care management team member scheduled for:  1 to 2 weeks to review home BG readings from CGM reports.        Cherre Robins, PharmD Clinical Pharmacist [facility]

## 2020-12-27 NOTE — Telephone Encounter (Signed)
Pt sent a message asking for refills on all medications.  To clarify, is he on all of the following?  I am having him maintain his glucose blood, OneTouch Delica Lancets 87Z, AirDuo Digihaler, fluticasone, azelastine, ProAir Digihaler, gabapentin, losartan, Rybelsus, rosuvastatin, fenofibrate, furosemide, telmisartan, chlorthalidone, and amLODipine. We will continue to administer dexamethasone.
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
## 2020-12-27 NOTE — Progress Notes (Signed)
Ive actually never ordered a freestyle libre so no sure of the specifics on how to order-- do they start with 14 day?

## 2020-12-31 ENCOUNTER — Other Ambulatory Visit: Payer: Self-pay | Admitting: Family Medicine

## 2020-12-31 DIAGNOSIS — E1165 Type 2 diabetes mellitus with hyperglycemia: Secondary | ICD-10-CM

## 2020-12-31 NOTE — Progress Notes (Signed)
It should be the [employer] 2. Only have to order sensors. Changed every 14 days and comes in 2's; So send in 2 sensors for 28 days supply. No transmitter or other equipment needed.

## 2021-01-01 NOTE — Telephone Encounter (Signed)
Referrer to [employer].

## 2021-01-02 ENCOUNTER — Other Ambulatory Visit: Payer: Self-pay

## 2021-01-02 DIAGNOSIS — I10 Essential (primary) hypertension: Secondary | ICD-10-CM

## 2021-01-02 MED ORDER — TELMISARTAN 80 MG PO TABS: 80.0000 mg | ORAL_TABLET | Freq: Every day | ORAL | 0 refills | Status: DC

## 2021-01-02 MED ORDER — ROSUVASTATIN CALCIUM 10 MG PO TABS: 10.0000 mg | ORAL_TABLET | Freq: Every day | ORAL | 2 refills | Status: DC

## 2021-01-02 MED ORDER — FENOFIBRATE 160 MG PO TABS: 160.0000 mg | ORAL_TABLET | Freq: Every day | ORAL | 1 refills | Status: DC

## 2021-01-02 MED ORDER — LOSARTAN POTASSIUM 50 MG PO TABS: 1.0000 | ORAL_TABLET | Freq: Every day | ORAL | 1 refills | Status: DC

## 2021-01-02 MED ORDER — METOPROLOL SUCCINATE ER 200 MG PO TB24: 200.0000 mg | ORAL_TABLET | Freq: Every day | ORAL | 0 refills | Status: DC

## 2021-01-02 MED ORDER — CHLORTHALIDONE 25 MG PO TABS: 25.0000 mg | ORAL_TABLET | Freq: Every day | ORAL | 1 refills | Status: DC

## 2021-02-07 ENCOUNTER — Ambulatory Visit: Payer: Managed Care, Other (non HMO) | Admitting: Pharmacist

## 2021-02-07 ENCOUNTER — Other Ambulatory Visit: Payer: Self-pay

## 2021-02-07 DIAGNOSIS — E785 Hyperlipidemia, unspecified: Secondary | ICD-10-CM

## 2021-02-07 DIAGNOSIS — E1169 Type 2 diabetes mellitus with other specified complication: Secondary | ICD-10-CM

## 2021-02-07 DIAGNOSIS — E1165 Type 2 diabetes mellitus with hyperglycemia: Secondary | ICD-10-CM

## 2021-02-07 DIAGNOSIS — I10 Essential (primary) hypertension: Secondary | ICD-10-CM

## 2021-02-07 NOTE — Patient Instructions (Signed)
Visit Information  PATIENT GOALS:  Goals Addressed             This Visit's Progress    Medications Management        Diabetes:  Current treatment of Rybelsus 7mg  daily    Interventions:  Educated on BG / diabetes goals Counseled on limiting intake of high CHO foods and increasing physical activity to goal of 150 minutes per week Education provided for [employer] CGM; initiated CGM today with samples;      Patient Actions:  Call clinical pharmacist with questions or issues regarding CGM Continue to take current medications for diabetes Limit intake of high CHO foods and increasing physical activity to goal of 150 minutes per week  Hypertension:  Current treatment:  Telmisartan 80mg  daily  Metoprolol 100mg  daiy  Amlodipine 10mg  daily  Chlorthalidone 25mg  daily   Recommended continue current therapy for blood pressure and updated medication list      Patient Actions:  Continue to take current medications for blood pressure  Hyperlipidemia:  Uncontrolled - last LDL was 126 (10/24/20)  Initiated treatment with rosuvastatin 10mg  daily       Patient Actions:  Continue rosuvastatin 10mg  daily  Limit intake of fried foods and foods high in saturated fat Recheck lipids in 3 to 6 months  Patient Goals/Self-Care Activities Over the next 180 days, patient will:  take medications as prescribed and check glucose daily , document, and provide at future appointments  Follow Up Plan: Telephone follow up appointment with care management team member scheduled for:  1 to 2 weeks to review home BG readings from CGM reports.
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
## 2021-02-14 ENCOUNTER — Telehealth: Payer: Managed Care, Other (non HMO)

## 2021-02-18 ENCOUNTER — Other Ambulatory Visit: Payer: Self-pay | Admitting: Family Medicine

## 2021-02-18 ENCOUNTER — Other Ambulatory Visit: Payer: Self-pay | Admitting: Allergy and Immunology

## 2021-02-19 ENCOUNTER — Telehealth: Payer: Managed Care, Other (non HMO)

## 2021-02-20 ENCOUNTER — Encounter: Payer: Self-pay | Admitting: Family Medicine

## 2021-02-20 DIAGNOSIS — J3089 Other allergic rhinitis: Secondary | ICD-10-CM

## 2021-02-20 DIAGNOSIS — E1165 Type 2 diabetes mellitus with hyperglycemia: Secondary | ICD-10-CM

## 2021-02-20 MED ORDER — RYBELSUS 7 MG PO TABS
1.0000 | ORAL_TABLET | Freq: Every day | ORAL | 2 refills | Status: DC
Start: 2021-02-20 — End: 2021-06-23

## 2021-02-20 MED ORDER — FLUTICASONE PROPIONATE 50 MCG/ACT NA SUSP: NASAL | 5 refills | Status: DC

## 2021-02-20 MED ORDER — MONTELUKAST SODIUM 10 MG PO TABS: 10.0000 mg | ORAL_TABLET | Freq: Every day | ORAL | 3 refills | Status: DC

## 2021-02-20 MED ORDER — AZELASTINE HCL 0.1 % NA SOLN: NASAL | 5 refills | Status: DC

## 2021-03-05 ENCOUNTER — Telehealth: Payer: Self-pay | Admitting: Pharmacist

## 2021-03-05 ENCOUNTER — Telehealth: Payer: Managed Care, Other (non HMO)

## 2021-03-05 NOTE — Telephone Encounter (Signed)
Called to follow up CGM. Looks like the [place] fell off after <48 hours both times he tried.  I also called to make sure patient had all his medications.  Unable to reach patient. LM on VM.

## 2021-05-27 ENCOUNTER — Telehealth: Payer: Self-pay | Admitting: *Deleted

## 2021-05-27 NOTE — Telephone Encounter (Signed)
Prior auth started via cover my meds.  Awaiting determination.  Key: [id number]

## 2021-05-30 NOTE — Telephone Encounter (Signed)
Insurance has denied Rybelsus  "This has been denied as this medication is a step therapy medication and requires the following tried and failed therapies: Metformin or any Generic Sulfonylurea (chlorpropamide, Glimiperide, Glipizide, Glyburide, Tolazamide, Tolbutamide)

## 2021-06-23 ENCOUNTER — Telehealth: Payer: Self-pay | Admitting: Family Medicine

## 2021-06-23 DIAGNOSIS — E1165 Type 2 diabetes mellitus with hyperglycemia: Secondary | ICD-10-CM

## 2021-06-23 MED ORDER — RYBELSUS 7 MG PO TABS: 1.0000 | ORAL_TABLET | Freq: Every day | ORAL | 2 refills | Status: DC

## 2021-06-23 NOTE — Telephone Encounter (Signed)
Refills sent

## 2021-06-23 NOTE — Telephone Encounter (Signed)
Medication: Semaglutide (RYBELSUS) 7 MG TABS   Has the patient contacted their pharmacy? yes (If no, request that the patient contact the pharmacy for the refill.) (If yes, when and what did the pharmacy advise?)  Preferred Pharmacy (with phone number or street name): [facility] [id number], [place], [place] [address]  Phone:  [phone]  Fax:  [phone]   Agent: Please be advised that RX refills may take up to 3 business days. We ask that you follow-up with your pharmacy.

## 2021-08-08 ENCOUNTER — Telehealth: Payer: Self-pay | Admitting: *Deleted

## 2021-08-08 NOTE — Telephone Encounter (Signed)
error 

## 2021-08-11 ENCOUNTER — Telehealth: Payer: Self-pay | Admitting: Family Medicine

## 2021-08-11 NOTE — Telephone Encounter (Signed)
Pt needs prior authorization for Semaglutide [facility]) 7 MG TABS    [facility] [address] [place],  [address] [phone]

## 2021-08-11 NOTE — Telephone Encounter (Signed)
Patient needs to give pharmacy ins information.  Patient notified.

## 2021-09-12 ENCOUNTER — Telehealth: Payer: Self-pay | Admitting: Family Medicine

## 2021-09-12 ENCOUNTER — Other Ambulatory Visit: Payer: Self-pay

## 2021-09-12 DIAGNOSIS — E1165 Type 2 diabetes mellitus with hyperglycemia: Secondary | ICD-10-CM

## 2021-09-12 MED ORDER — RYBELSUS 7 MG PO TABS: 1.0000 | ORAL_TABLET | Freq: Every day | ORAL | 2 refills | Status: DC

## 2021-09-12 NOTE — Telephone Encounter (Signed)
Pt need prior auth, and is hoping to get his medications by today if possible.   Medication: Semaglutide (RYBELSUS) 7 MG TABS  Has the patient contacted their pharmacy? Yes.     [facility]acy: [facility] [address] [place], [place] [address] [phone]

## 2021-09-12 NOTE — Telephone Encounter (Signed)
Medication sent.

## 2021-09-18 NOTE — Telephone Encounter (Signed)
Patient states the pharmacy still has not filled the prescription because they need a PA. Please advise.

## 2021-09-18 NOTE — Telephone Encounter (Signed)
Prior auth started via cover my meds.  Awaiting determination.  Key: [id number]

## 2021-09-19 NOTE — Telephone Encounter (Signed)
Called insurance to check status Estill Bamberg stated that they need clinical note.  Ref # [id number].  Fax: [phone].

## 2021-09-25 NOTE — Telephone Encounter (Signed)
Patient called to get an update on his Rybelsus medication. He would like to know if he needs to contact his insurance as well. Please advise.

## 2021-09-26 NOTE — Telephone Encounter (Signed)
Jack Thomas at [employer] stated that since patient has been on the lower dose that they will approved the higher dose.  She stated also that she will call the pharmacy to get the medication processed.  Patient notified.  Patient also made follow up appointment.  Also advised patient about keeping up with follow ups every 3-6 months to maintain his blood work and prescription.  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

## 2021-10-10 ENCOUNTER — Encounter: Payer: Self-pay | Admitting: Family Medicine

## 2021-10-10 ENCOUNTER — Ambulatory Visit (INDEPENDENT_AMBULATORY_CARE_PROVIDER_SITE_OTHER): Payer: PRIVATE HEALTH INSURANCE | Admitting: Family Medicine

## 2021-10-10 VITALS — BP 142/88 | HR 76 | Temp 98.6°F | Resp 18 | Ht 73.0 in | Wt 368.6 lb

## 2021-10-10 DIAGNOSIS — E1169 Type 2 diabetes mellitus with other specified complication: Secondary | ICD-10-CM

## 2021-10-10 DIAGNOSIS — M545 Low back pain, unspecified: Secondary | ICD-10-CM

## 2021-10-10 DIAGNOSIS — E785 Hyperlipidemia, unspecified: Secondary | ICD-10-CM

## 2021-10-10 DIAGNOSIS — E1165 Type 2 diabetes mellitus with hyperglycemia: Secondary | ICD-10-CM

## 2021-10-10 DIAGNOSIS — I10 Essential (primary) hypertension: Secondary | ICD-10-CM

## 2021-10-10 MED ORDER — LOSARTAN POTASSIUM 50 MG PO TABS
50.0000 mg | ORAL_TABLET | Freq: Every day | ORAL | 0 refills | Status: DC
Start: 2021-10-10 — End: 2021-11-26

## 2021-10-10 MED ORDER — GABAPENTIN 300 MG PO CAPS: 300.0000 mg | ORAL_CAPSULE | Freq: Two times a day (BID) | ORAL | 1 refills | Status: DC

## 2021-10-10 NOTE — Progress Notes (Addendum)
Subjective:   By signing my name below, I, Zite Okoli, attest that this documentation has been prepared under the direction and in the presence of Ann Held, DO. 10/10/2021    Patient ID: Jack Thomas, male    DOB: March 26, 1976, 46 y.o.   MRN: [id number]  Chief Complaint  Patient presents with   Diabetes   Follow-up    HPI Patient is in today for an office visit and diabetes f/u  He has been experiencing back and left knee pain. He is not seeing any specialist for this. He uses 200 mg ibuprofen four times a day. He is requesting for a refill on 300 mg gabapentin.  He does not check his sugars at home. He recently received authorization from insurance to obtain 7 mg rybelsus. He started using it a week ago. He has gained some weight. Wt Readings from Last 3 Encounters:  10/10/21 (!) 368 lb 9.6 oz (167.2 kg)  10/25/20 (!) 364 lb 12.8 oz (165.5 kg)  05/17/20 (!) 362 lb 6.4 oz (164.4 kg)    He does not check his blood pressure at home. His blood pressure is elevated at this visit.  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   Past Medical History:  Diagnosis Date   Arthritis    right knee; left shoulder   Diabetes mellitus without complication ([place])    Hyperlipidemia    Hypertension    takes metoprolol daily   Sleep apnea    sleep study - recommended cpap    Past Surgical History:  Procedure Laterality Date   KNEE SURGERY  2009   right   NASAL SEPTOPLASTY W/ TURBINOPLASTY  12/03/2011   Procedure: NASAL SEPTOPLASTY WITH TURBINATE REDUCTION;  Surgeon: Izora Gala, MD;  Location: [facility] OR;  Service: ENT;  Laterality: Bilateral;   NASAL SINUS SURGERY  12/03/2011   Procedure: ENDOSCOPIC SINUS SURGERY;  Surgeon: Izora Gala, MD;  Location: [facility] OR;  Service: ENT;  Laterality: Bilateral;    Family History   Problem Relation Age of Onset   Diabetes Mother    Allergies Mother    Hyperlipidemia Father    Hypertension Father    Diabetes Father    Anesthesia problems Neg Hx    Hypotension Neg Hx    Malignant hyperthermia Neg Hx    Pseudochol deficiency Neg Hx    Colon cancer Neg Hx    Esophageal cancer Neg Hx     Social History   Socioeconomic History   Marital status: Single    Spouse name: Not on file   Number of children: 0   Years of education: Not on file   Highest education level: Not on file  Occupational History   Occupation: Contractor: [employer]  Tobacco Use   Smoking status: Some Days    Packs/day: 0.50    Years: 6.00    Pack years: 3.00    Types: Cigars, Cigarettes    Last attempt to quit: 11/02/2010    Years since quitting: 10.9   Smokeless tobacco: Never  Vaping Use   Vaping Use: Never used  Substance and Sexual Activity   Alcohol use: Yes    Comment: ocassionally    Drug use: No   Sexual activity: Not on file  Other Topics Concern  Not on file  Social History Narrative   Not on file   Social Determinants of Health   Financial Resource Strain: Low Risk    Difficulty of Paying Living Expenses: Not hard at all  Food Insecurity: Not on file  Transportation Needs: Not on file  Physical Activity: Not on file  Stress: Not on file  Social Connections: Not on file  Intimate Partner Violence: Not on file    Outpatient Medications Prior to Visit  Medication Sig Dispense Refill   AIRDUO DIGIHALER 113-14 MCG/ACT AEPB Inhale 1 puff into the lungs twice daily. Rinse, gargle and spit after use. (Patient taking differently: Inhale 1 puff into the lungs daily. Inhale 1 puff into the lungs daily. Rinse, gargle and spit after use.) 1 each 5   amLODipine (NORVASC) 10 MG tablet Take 1 tablet (10 mg total) by mouth daily. 90 tablet 3   azelastine (ASTELIN) 0.1 % nasal spray Use 1 spray in each nostril every night before CPAP use. 30 mL 5    chlorthalidone (HYGROTON) 25 MG tablet Take 1 tablet (25 mg total) by mouth daily. 90 tablet 1   Continuous Blood Gluc Transmit (DEXCOM G6 TRANSMITTER) MISC 1 transmitter as directed q90 days 1 each 3   fenofibrate 160 MG tablet Take 1 tablet (160 mg total) by mouth daily. 90 tablet 1   fluticasone (FLONASE) 50 MCG/ACT nasal spray Use 2 sprays in each nostril each night before CPAP use 16 g 5   furosemide (LASIX) 20 MG tablet TAKE 1 TABLET BY MOUTH DAILY 90 tablet 1   glucose blood (ONETOUCH VERIO) test strip Check blood sugars once daily and as needed. 100 each 12   metoprolol (TOPROL-XL) 200 MG 24 hr tablet Take 1 tablet (200 mg total) by mouth daily. 90 tablet 0   montelukast (SINGULAIR) 10 MG tablet Take 1 tablet (10 mg total) by mouth at bedtime. 90 tablet 3   ONETOUCH DELICA LANCETS 09N MISC Check blood sugars once daily and as needed. 100 each 12   PROAIR DIGIHALER 108 MCG/ACT AEPB Inhale 2 puffs into the lungs every 4-6 hours as needed for cough, wheeze, shortness of breath or chest tightness. 1 each 1   rosuvastatin (CRESTOR) 10 MG tablet Take 1 tablet (10 mg total) by mouth daily. 30 tablet 2   Semaglutide (RYBELSUS) 7 MG TABS Take 1 tablet by mouth daily. 30 tablet 2   gabapentin (NEURONTIN) 300 MG capsule Take 300 mg by mouth in the morning and at bedtime.     Facility-Administered Medications Prior to Visit  Medication Dose Route Frequency Provider Last Rate Last Admin   dexamethasone (DECADRON) injection 15 mg  15 mg Other Once Magnus Sinning, MD        No Known Allergies  Review of Systems  Constitutional:  Negative for fever.  HENT:  Negative for congestion, ear pain, hearing loss, sinus pain and sore throat.   Eyes:  Negative for blurred vision and pain.  Respiratory:  Negative for cough, sputum production, shortness of breath and wheezing.   Cardiovascular:  Negative for chest pain and palpitations.  Gastrointestinal:  Negative for blood in stool, constipation,  diarrhea, nausea and vomiting.  Genitourinary:  Negative for dysuria, frequency, hematuria and urgency.  Musculoskeletal:  Positive for back pain and joint pain (left knee). Negative for falls and myalgias.  Neurological:  Negative for dizziness, sensory change, loss of consciousness, weakness and headaches.  Endo/Heme/Allergies:  Negative for environmental allergies. Does not bruise/bleed easily.  Psychiatric/Behavioral:  Negative for depression and suicidal ideas. The patient is not nervous/anxious and does not have insomnia.       Objective:    Physical Exam Constitutional:      General: He is not in acute distress.    Appearance: Normal appearance. He is not ill-appearing.  HENT:     Head: Normocephalic and atraumatic.     Right Ear: Tympanic membrane, ear canal and external ear normal.     Left Ear: Tympanic membrane, ear canal and external ear normal.  Eyes:     Pupils: Pupils are equal, round, and reactive to light.  Cardiovascular:     Rate and Rhythm: Normal rate and regular rhythm.     Pulses: Normal pulses.     Heart sounds: No murmur heard.   No gallop.  Pulmonary:     Effort: Pulmonary effort is normal. No respiratory distress.     Breath sounds: Normal breath sounds. No wheezing or rhonchi.  Abdominal:     General: Bowel sounds are normal. There is no distension.     Palpations: Abdomen is soft.     Tenderness: There is no abdominal tenderness. There is no guarding.     Hernia: No hernia is present.  Musculoskeletal:     Cervical back: Neck supple.  Lymphadenopathy:     Cervical: No cervical adenopathy.  Skin:    General: Skin is warm and dry.  Neurological:     Mental Status: He is alert and oriented to person, place, and time.    BP (!) 142/88    Pulse 76    Temp 98.6 F (37 C) (Oral)    Resp 18    Ht '6\' 1"'$  (1.854 m)    Wt (!) 368 lb 9.6 oz (167.2 kg)    SpO2 96%    BMI 48.63 kg/m  Wt Readings from Last 3 Encounters:  10/10/21 (!) 368 lb 9.6 oz (167.2  kg)  10/25/20 (!) 364 lb 12.8 oz (165.5 kg)  05/17/20 (!) 362 lb 6.4 oz (164.4 kg)    Diabetic Foot Exam - Simple   No data filed    Lab Results  Component Value Date   WBC 13.2 (H) 10/25/2020   HGB 13.7 10/25/2020   HCT 41.2 10/25/2020   PLT 280 10/25/2020   GLUCOSE 114 (H) 10/25/2020   CHOL 193 10/25/2020   TRIG 211 (H) 10/25/2020   HDL 34 (L) 10/25/2020   LDLDIRECT 119.0 11/27/2019   LDLCALC 126 (H) 10/25/2020   ALT 16 10/25/2020   AST 20 10/25/2020   NA 142 10/25/2020   K 4.0 10/25/2020   CL 103 10/25/2020   CREATININE 1.11 10/25/2020   BUN 18 10/25/2020   CO2 26 10/25/2020   TSH 1.58 10/25/2020   PSA 0.29 10/25/2020   INR 0.93 01/16/2013   HGBA1C 6.4 (H) 10/25/2020   MICROALBUR 0.2 10/25/2020    Lab Results  Component Value Date   TSH 1.58 10/25/2020   Lab Results  Component Value Date   WBC 13.2 (H) 10/25/2020   HGB 13.7 10/25/2020   HCT 41.2 10/25/2020   MCV 94.7 10/25/2020   PLT 280 10/25/2020   Lab Results  Component Value Date   NA 142 10/25/2020   K 4.0 10/25/2020   CO2 26 10/25/2020   GLUCOSE 114 (H) 10/25/2020   BUN 18 10/25/2020   CREATININE 1.11 10/25/2020   BILITOT 0.6 10/25/2020   ALKPHOS 63 11/27/2019   AST 20 10/25/2020   ALT 16 10/25/2020  PROT 7.1 10/25/2020   ALBUMIN 4.0 11/27/2019   CALCIUM 9.2 10/25/2020   GFR 121.47 11/27/2019   Lab Results  Component Value Date   CHOL 193 10/25/2020   Lab Results  Component Value Date   HDL 34 (L) 10/25/2020   Lab Results  Component Value Date   LDLCALC 126 (H) 10/25/2020   Lab Results  Component Value Date   TRIG 211 (H) 10/25/2020   Lab Results  Component Value Date   CHOLHDL 5.7 (H) 10/25/2020   Lab Results  Component Value Date   HGBA1C 6.4 (H) 10/25/2020       Assessment & Plan:   Problem List Items Addressed This Visit       Unprioritized   Diabetes mellitus, type II ([place]) - Primary    hgba1c to be checked, minimize simple carbs. Increase exercise as  tolerated. Continue current meds Will have pharm d look into freestyle libre      Relevant Medications   losartan (COZAAR) 50 MG tablet   Other Relevant Orders   CBC with Differential/Platelet   Comprehensive metabolic panel   Hemoglobin A1c   Lipid panel   Microalbumin / creatinine urine ratio   Essential hypertension    Poorly controlled will alter medications, encouraged DASH diet, minimize caffeine and obtain adequate sleep. Report concerning symptoms and follow up as directed and as needed      Relevant Medications   losartan (COZAAR) 50 MG tablet   Other Visit Diagnoses     Primary hypertension       Relevant Medications   losartan (COZAAR) 50 MG tablet   Other Relevant Orders   CBC with Differential/Platelet   Comprehensive metabolic panel   Hemoglobin A1c   Lipid panel   Microalbumin / creatinine urine ratio   Hyperlipidemia associated with type 2 diabetes mellitus (HCC)       Relevant Medications   losartan (COZAAR) 50 MG tablet   Other Relevant Orders   CBC with Differential/Platelet   Comprehensive metabolic panel   Hemoglobin A1c   Lipid panel   Microalbumin / creatinine urine ratio   Low back pain with radiation       Relevant Medications   gabapentin (NEURONTIN) 300 MG capsule       Meds ordered this encounter  Medications   gabapentin (NEURONTIN) 300 MG capsule    Sig: Take 1 capsule (300 mg total) by mouth 2 (two) times daily.    Dispense:  180 capsule    Refill:  1   losartan (COZAAR) 50 MG tablet    Sig: Take 1 tablet (50 mg total) by mouth daily.    Dispense:  30 tablet    Refill:  0    I,Zite Okoli,acting as a scribe for [employer], DO.,have documented all relevant documentation on the behalf of Ann Held, DO,as directed by  Ann Held, DO while in the presence of Ann Held, [place], DO., personally preformed the services described in this documentation.  All medical record  entries made by the scribe were at my direction and in my presence.  I have reviewed the chart and discharge instructions (if applicable) and agree that the record reflects my personal performance and is accurate and complete. 10/10/2021

## 2021-10-10 NOTE — Patient Instructions (Signed)

## 2021-10-10 NOTE — Assessment & Plan Note (Signed)
Poorly controlled will alter medications, encouraged DASH diet, minimize caffeine and obtain adequate sleep. Report concerning symptoms and follow up as directed and as needed 

## 2021-10-10 NOTE — Assessment & Plan Note (Signed)
hgba1c to be checked, minimize simple carbs. Increase exercise as tolerated. Continue current meds ?Will have pharm d look into freestyle libre ?

## 2021-10-10 NOTE — Assessment & Plan Note (Signed)
Encourage heart healthy diet such as MIND or DASH diet, increase exercise, avoid trans fats, simple carbohydrates and processed foods, consider a krill or fish or flaxseed oil cap daily.  °

## 2021-10-11 LAB — MICROALBUMIN / CREATININE URINE RATIO
Creatinine, Urine: 63 mg/dL (ref 20–320)
Microalb Creat Ratio: 6 mcg/mg creat (ref <30)
Microalb, Ur: 0.4 mg/dL

## 2021-10-11 LAB — CBC WITH DIFFERENTIAL/PLATELET
Absolute Monocytes: 877 cells/uL (ref 200–950)
Basophils Absolute: 78 cells/uL (ref 0–200)
Basophils Relative: 0.7 %
Eosinophils Absolute: 344 cells/uL (ref 15–500)
Eosinophils Relative: 3.1 %
HCT: 43.8 % (ref 38.5–50.0)
Hemoglobin: 14.4 g/dL (ref 13.2–17.1)
Lymphs Abs: 3763 cells/uL (ref 850–3900)
MCH: 31.6 pg (ref 27.0–33.0)
MCHC: 32.9 g/dL (ref 32.0–36.0)
MCV: 96.1 fL (ref 80.0–100.0)
MPV: 11.4 fL (ref 7.5–12.5)
Monocytes Relative: 7.9 %
Neutro Abs: 6038 cells/uL (ref 1500–7800)
Neutrophils Relative %: 54.4 %
Platelets: 261 10*3/uL (ref 140–400)
RBC: 4.56 10*6/uL (ref 4.20–5.80)
RDW: 11.7 % (ref 11.0–15.0)
Total Lymphocyte: 33.9 %
WBC: 11.1 10*3/uL — ABNORMAL HIGH (ref 3.8–10.8)

## 2021-10-11 LAB — COMPREHENSIVE METABOLIC PANEL
AG Ratio: 1.4 (calc) (ref 1.0–2.5)
ALT: 18 U/L (ref 9–46)
AST: 24 U/L (ref 10–40)
Albumin: 4.1 g/dL (ref 3.6–5.1)
Alkaline phosphatase (APISO): 62 U/L (ref 36–130)
BUN: 13 mg/dL (ref 7–25)
CO2: 27 mmol/L (ref 20–32)
Calcium: 9.3 mg/dL (ref 8.6–10.3)
Chloride: 107 mmol/L (ref 98–110)
Creat: 0.8 mg/dL (ref 0.60–1.29)
Globulin: 3 g/dL (calc) (ref 1.9–3.7)
Glucose, Bld: 120 mg/dL — ABNORMAL HIGH (ref 65–99)
Potassium: 4.2 mmol/L (ref 3.5–5.3)
Sodium: 141 mmol/L (ref 135–146)
Total Bilirubin: 0.5 mg/dL (ref 0.2–1.2)
Total Protein: 7.1 g/dL (ref 6.1–8.1)

## 2021-10-11 LAB — HEMOGLOBIN A1C
Hgb A1c MFr Bld: 6.2 % of total Hgb — ABNORMAL HIGH (ref <5.7)
Mean Plasma Glucose: 131 mg/dL
eAG (mmol/L): 7.3 mmol/L

## 2021-10-11 LAB — LIPID PANEL
Cholesterol: 180 mg/dL (ref <200)
HDL: 36 mg/dL — ABNORMAL LOW (ref >=40)
LDL Cholesterol (Calc): 110 mg/dL (calc) — ABNORMAL HIGH
Non-HDL Cholesterol (Calc): 144 mg/dL (calc) — ABNORMAL HIGH (ref <130)
Total CHOL/HDL Ratio: 5 (calc) — ABNORMAL HIGH (ref <5.0)
Triglycerides: 224 mg/dL — ABNORMAL HIGH (ref <150)

## 2021-10-15 ENCOUNTER — Ambulatory Visit: Payer: PRIVATE HEALTH INSURANCE | Admitting: Pharmacist

## 2021-10-15 DIAGNOSIS — I10 Essential (primary) hypertension: Secondary | ICD-10-CM

## 2021-10-15 DIAGNOSIS — E1169 Type 2 diabetes mellitus with other specified complication: Secondary | ICD-10-CM

## 2021-10-15 DIAGNOSIS — E1165 Type 2 diabetes mellitus with hyperglycemia: Secondary | ICD-10-CM

## 2021-10-15 MED ORDER — FREESTYLE LIBRE 3 SENSOR MISC: 1.0000 | 5 refills | Status: DC
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
## 2021-10-16 ENCOUNTER — Other Ambulatory Visit: Payer: Self-pay

## 2021-10-16 MED ORDER — ROSUVASTATIN CALCIUM 20 MG PO TABS: 20.0000 mg | ORAL_TABLET | Freq: Every day | ORAL | 2 refills | Status: DC

## 2021-10-17 ENCOUNTER — Ambulatory Visit: Payer: PRIVATE HEALTH INSURANCE | Admitting: Pharmacist

## 2021-10-17 DIAGNOSIS — E1165 Type 2 diabetes mellitus with hyperglycemia: Secondary | ICD-10-CM

## 2021-10-17 DIAGNOSIS — E1169 Type 2 diabetes mellitus with other specified complication: Secondary | ICD-10-CM
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
## 2021-10-31 ENCOUNTER — Ambulatory Visit: Payer: PRIVATE HEALTH INSURANCE | Admitting: Pharmacist

## 2021-10-31 DIAGNOSIS — E1169 Type 2 diabetes mellitus with other specified complication: Secondary | ICD-10-CM

## 2021-10-31 DIAGNOSIS — I10 Essential (primary) hypertension: Secondary | ICD-10-CM

## 2021-10-31 DIAGNOSIS — E1165 Type 2 diabetes mellitus with hyperglycemia: Secondary | ICD-10-CM

## 2021-10-31 MED ORDER — RYBELSUS 14 MG PO TABS: 1.0000 | ORAL_TABLET | Freq: Every day | ORAL | 5 refills | Status: DC

## 2021-10-31 MED ORDER — CHLORTHALIDONE 25 MG PO TABS: 25.0000 mg | ORAL_TABLET | Freq: Every day | ORAL | 1 refills | Status: DC

## 2021-10-31 MED ORDER — METOPROLOL SUCCINATE ER 200 MG PO TB24: 200.0000 mg | ORAL_TABLET | Freq: Every day | ORAL | 1 refills | Status: DC
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
## 2021-10-31 NOTE — Addendum Note (Signed)
Addended by: Cherre Robins B on: 10/31/2021 03:06 PM ? ? Modules accepted: Orders ? ?

## 2021-10-31 NOTE — Chronic Care Management (AMB) (Signed)
Addendum: Discussed hypertension. Patient is past due for refills for metoprolol and chlorthalidone. Updated prescriptions sent to [facility]. Discussed blood pressure goals. Will focus on medication adherence and weight loss to help lower blood pressure.  ?

## 2021-10-31 NOTE — Patient Instructions (Signed)
?  Diabetes / obesity: ?Current treatment of Rybelsus '7mg'$  daily  ? ?Lab Results  ?Component Value Date  ? HGBA1C 6.2 (H) 10/10/2021  ? ? Interventions:  ?Educated on BG / diabetes goals ?Counseled on limiting intake of high CHO foods and increasing physical activity to goal of 150 minutes per week ?Patient Actions:  ?Call clinical pharmacist with questions or issues regarding CGM ?Continue to take current medications for diabetes ?Limit intake of high CHO foods and increasing physical activity to goal of 150 minutes per week ?Increase Rybelsus to '14mg'$  - take 1 tablet daily on an empty stomach with a sip of water. May eat and take other medication after 30 minutes ? ?Hypertension: ?Current treatment:  ?Telmisartan '80mg'$  daily  ?Metoprolol '100mg'$  daiy  ?Amlodipine '10mg'$  daily  ?Chlorthalidone '25mg'$  daily  ?Recommended continue current therapy for blood pressure and updated medication list ? ?BP Readings from Last 3 Encounters:  ?10/10/21 (!) 142/88  ?10/25/20 138/88  ?05/17/20 122/80  ? ?Patient Actions:  ?Continue to take current medications for blood pressure ? ?Hyperlipidemia: ?Uncontrolled - last LDL was 110(10/10/20).  ?Lab Results  ?Component Value Date  ? CHOL 180 10/10/2021  ? CHOL 193 10/25/2020  ? CHOL 207 (H) 05/17/2020  ? ?Lab Results  ?Component Value Date  ? HDL 36 (L) 10/10/2021  ? HDL 34 (L) 10/25/2020  ? HDL 35 (L) 05/17/2020  ? ?Lab Results  ?Component Value Date  ? LDLCALC 110 (H) 10/10/2021  ? LDLCALC 126 (H) 10/25/2020  ? LDLCALC 131 (H) 05/17/2020  ? ?Lab Results  ?Component Value Date  ? TRIG 224 (H) 10/10/2021  ? TRIG 211 (H) 10/25/2020  ? TRIG 265 (H) 05/17/2020  ? ? ?      Patient Actions:  ?Continue  rosuvastatin '20mg'$  daily   ?Limit intake of fried foods and foods high in saturated fat ?Recheck lipids in 3 to 6 months ? ?Patient Goals/Self-Care Activities ?Over the next 180 days, patient will:  ?take medications as prescribed and  ?Continue to use Continuous Glucose Monitor to check blood glucose  frequency ?Increase Rybelsus to 14 mg daily. take on empty stomach with a sip of water. May eat and take other medication after 30 minutes ? ?Follow Up Plan: Telephone follow up appointment with care management team member scheduled for:  4 weeks  ?

## 2021-11-05 ENCOUNTER — Encounter: Payer: Self-pay | Admitting: Family Medicine

## 2021-11-05 NOTE — Telephone Encounter (Signed)
Patient scheduled for 11/10/21 at 240pm ?

## 2021-11-10 ENCOUNTER — Ambulatory Visit (INDEPENDENT_AMBULATORY_CARE_PROVIDER_SITE_OTHER): Payer: PRIVATE HEALTH INSURANCE | Admitting: Family Medicine

## 2021-11-10 ENCOUNTER — Encounter: Payer: Self-pay | Admitting: Family Medicine

## 2021-11-10 ENCOUNTER — Ambulatory Visit (HOSPITAL_BASED_OUTPATIENT_CLINIC_OR_DEPARTMENT_OTHER)
Admission: RE | Admit: 2021-11-10 | Discharge: 2021-11-10 | Disposition: A | Discharge status: Home / Self Care | Payer: PRIVATE HEALTH INSURANCE | Source: Ambulatory Visit | Attending: Family Medicine | Admitting: Family Medicine

## 2021-11-10 VITALS — BP 132/80 | HR 92 | Temp 98.0°F | Resp 20 | Ht 73.0 in | Wt 370.6 lb

## 2021-11-10 DIAGNOSIS — M25562 Pain in left knee: Secondary | ICD-10-CM | POA: Diagnosis present

## 2021-11-10 MED ORDER — CELECOXIB 200 MG PO CAPS: 200.0000 mg | ORAL_CAPSULE | Freq: Two times a day (BID) | ORAL | 3 refills | Status: DC

## 2021-11-10 NOTE — Patient Instructions (Signed)
Chronic Knee Pain, Adult °Chronic knee pain is pain in one or both knees that lasts longer than 3 months. Symptoms of chronic knee pain may include swelling, stiffness, and discomfort. Age-related wear and tear (osteoarthritis) of the knee joint is the most common cause of chronic knee pain. Other possible causes include: °A long-term immune-related disease that causes inflammation of the knee (rheumatoid arthritis). This usually affects both knees. °Inflammatory arthritis, such as gout or pseudogout. °An injury to the knee that causes arthritis. °An injury to the knee that damages the ligaments. Ligaments are strong tissues that connect bones to each other. °Runner's knee or pain behind the kneecap. °Treatment for chronic knee pain depends on the cause. The main treatments for chronic knee pain are physical therapy and weight loss. This condition may also be treated with medicines, injections, a knee sleeve or brace, and by using crutches. Rest, ice, pressure (compression), and elevation, also known as RICE therapy, may also be recommended. °Follow these instructions at home: °If you have a knee sleeve or brace: ° °Wear the knee sleeve or brace as told by your health care provider. Remove it only as told by your health care provider. °Loosen it if your toes tingle, become numb, or turn cold and blue. °Keep it clean. °If the sleeve or brace is not waterproof: °Do not let it get wet. °Remove it if allowed by your health care provider, or cover it with a watertight covering when you take a bath or a shower. °Managing pain, stiffness, and swelling °  °If directed, apply heat to the affected area as often as told by your health care provider. Use the heat source that your health care provider recommends, such as a moist heat pack or a heating pad. °If you have a removable knee sleeve or brace, remove it as told by your health care provider. °Place a towel between your skin and the heat source. °Leave the heat on for  20-30 minutes. °Remove the heat if your skin turns bright red. This is especially important if you are unable to feel pain, heat, or cold. You may have a greater risk of getting burned. °If directed, put ice on the affected area. To do this: °If you have a removable knee sleeve or brace, remove it as told by your health care provider. °Put ice in a plastic bag. °Place a towel between your skin and the bag. °Leave the ice on for 20 minutes, 2-3 times a day. °Remove the ice if your skin turns bright red. This is very important. If you cannot feel pain, heat, or cold, you have a greater risk of damage to the area. °Move your toes often to reduce stiffness and swelling. °Raise (elevate) the injured area above the level of your heart while you are sitting or lying down. °Activity °Avoid high-impact activities or exercises, such as running, jumping rope, or doing jumping jacks. °Follow the exercise plan that your health care provider designed for you. Your health care provider may suggest that you: °Avoid activities that make knee pain worse. This may require you to change your exercise routines, sport participation, or job duties. °Wear shoes with cushioned soles. °Avoid sports that require running and sudden changes in direction. °Do physical therapy. Physical therapy is planned to match your needs and abilities. It may include exercises for strength, flexibility, stability, and endurance. °Do exercises that increase balance and strength, such as tai chi and yoga. °Do not use the injured limb to support your body weight   until your health care provider says that you can. Use crutches as told by your health care provider. °Return to your normal activities as told by your health care provider. Ask your health care provider what activities are safe for you. °General instructions °Take over-the-counter and prescription medicines only as told by your health care provider. °Lose weight if you are overweight. Losing even a  little weight can reduce knee pain. Ask your health care provider what your ideal weight is, and how to safely lose extra weight. A dietitian may be able to help you plan your meals. °Do not use any products that contain nicotine or tobacco, such as cigarettes, e-cigarettes, and chewing tobacco. These can delay healing. If you need help quitting, ask your health care provider. °Keep all follow-up visits. This is important. °Contact a health care provider if: °You have knee pain that is not getting better or gets worse. °You are unable to do your physical therapy exercises due to knee pain. °Get help right away if: °Your knee swells and the swelling becomes worse. °You cannot move your knee. °You have severe knee pain. °Summary °Knee pain that lasts more than 3 months is considered chronic knee pain. °The main treatments for chronic knee pain are physical therapy and weight loss. You may also need to take medicines, wear a knee sleeve or brace, use crutches, and apply ice or heat. °Losing even a little weight can reduce knee pain. Ask your health care provider what your ideal weight is, and how to safely lose extra weight. A dietitian may be able to help you plan your meals. °Follow the exercise plan that your health care provider designed for you. °This information is not intended to replace advice given to you by your health care provider. Make sure you discuss any questions you have with your health care provider. °Document Revised: 01/03/2020 Document Reviewed: 01/03/2020 °Elsevier Patient Education © 2022 [employer] ° °
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
## 2021-11-10 NOTE — Assessment & Plan Note (Signed)
Check xray  ?con't brace ?Ice alt heat  ?Consider sport med / ortho  ?

## 2021-11-11 ENCOUNTER — Other Ambulatory Visit: Payer: Self-pay | Admitting: *Deleted

## 2021-11-11 DIAGNOSIS — M25562 Pain in left knee: Secondary | ICD-10-CM

## 2021-11-22 ENCOUNTER — Other Ambulatory Visit: Payer: Self-pay | Admitting: Family Medicine

## 2021-11-22 DIAGNOSIS — I10 Essential (primary) hypertension: Secondary | ICD-10-CM

## 2021-11-26 ENCOUNTER — Ambulatory Visit: Payer: PRIVATE HEALTH INSURANCE | Admitting: Pharmacist

## 2021-11-26 DIAGNOSIS — E1169 Type 2 diabetes mellitus with other specified complication: Secondary | ICD-10-CM

## 2021-11-26 DIAGNOSIS — I10 Essential (primary) hypertension: Secondary | ICD-10-CM

## 2021-11-26 DIAGNOSIS — E1165 Type 2 diabetes mellitus with hyperglycemia: Secondary | ICD-10-CM

## 2021-11-26 MED ORDER — LOSARTAN POTASSIUM 50 MG PO TABS: 50.0000 mg | ORAL_TABLET | Freq: Every day | ORAL | 1 refills | Status: DC

## 2021-11-26 MED ORDER — ROSUVASTATIN CALCIUM 20 MG PO TABS: 20.0000 mg | ORAL_TABLET | Freq: Every day | ORAL | 0 refills | Status: DC
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
## 2021-12-18 ENCOUNTER — Encounter: Payer: Self-pay | Admitting: Family Medicine

## 2021-12-18 ENCOUNTER — Ambulatory Visit (INDEPENDENT_AMBULATORY_CARE_PROVIDER_SITE_OTHER): Payer: PRIVATE HEALTH INSURANCE | Admitting: Family Medicine

## 2021-12-18 ENCOUNTER — Ambulatory Visit: Payer: Self-pay

## 2021-12-18 VITALS — BP 130/90 | Ht 73.0 in | Wt 370.0 lb

## 2021-12-18 DIAGNOSIS — M1712 Unilateral primary osteoarthritis, left knee: Secondary | ICD-10-CM

## 2021-12-18 DIAGNOSIS — M25562 Pain in left knee: Secondary | ICD-10-CM

## 2021-12-18 NOTE — Patient Instructions (Addendum)
Nice to meet you Please try the exercises  The [employer] representative will call you about the brace. Please send me a message in MyChart with any questions or updates.  Please see me back in 4 weeks presenting with acute on chronic left knee pain.  The pain is over the lateral aspect.  Has a distant history of.   --Dr. Raeford Razor
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
## 2021-12-18 NOTE — Assessment & Plan Note (Signed)
Acute on chronic in nature.  Has significant effusion and degenerative changes noticed on exam. -Counseled on home exercise therapy and supportive care. -Aspiration today. -Counseled on Celebrex. -Pursue lateral unloader brace due to thigh to calf ratio -Could consider injection or physical therapy.

## 2021-12-25 ENCOUNTER — Telehealth: Payer: PRIVATE HEALTH INSURANCE

## 2022-01-10 ENCOUNTER — Other Ambulatory Visit: Payer: Self-pay | Admitting: Family Medicine

## 2022-01-15 ENCOUNTER — Ambulatory Visit: Payer: PRIVATE HEALTH INSURANCE | Admitting: Family Medicine

## 2022-01-16 ENCOUNTER — Ambulatory Visit: Payer: PRIVATE HEALTH INSURANCE | Admitting: Family Medicine

## 2022-01-22 ENCOUNTER — Other Ambulatory Visit: Payer: Self-pay | Admitting: Family Medicine

## 2022-01-27 ENCOUNTER — Other Ambulatory Visit: Payer: Self-pay | Admitting: Family Medicine

## 2022-01-27 MED ORDER — FENOFIBRATE 160 MG PO TABS: 160.0000 mg | ORAL_TABLET | Freq: Every day | ORAL | 0 refills | Status: DC

## 2022-02-21 ENCOUNTER — Other Ambulatory Visit: Payer: Self-pay | Admitting: Family Medicine

## 2022-02-21 DIAGNOSIS — J3089 Other allergic rhinitis: Secondary | ICD-10-CM

## 2022-03-20 ENCOUNTER — Other Ambulatory Visit: Payer: Self-pay | Admitting: Family Medicine

## 2022-04-05 ENCOUNTER — Other Ambulatory Visit: Payer: Self-pay | Admitting: Family Medicine

## 2022-04-05 DIAGNOSIS — I10 Essential (primary) hypertension: Secondary | ICD-10-CM

## 2022-04-13 ENCOUNTER — Encounter: Payer: Self-pay | Admitting: Family Medicine

## 2022-04-13 ENCOUNTER — Other Ambulatory Visit (HOSPITAL_BASED_OUTPATIENT_CLINIC_OR_DEPARTMENT_OTHER): Payer: PRIVATE HEALTH INSURANCE

## 2022-04-13 ENCOUNTER — Ambulatory Visit (HOSPITAL_BASED_OUTPATIENT_CLINIC_OR_DEPARTMENT_OTHER)
Admission: RE | Admit: 2022-04-13 | Discharge: 2022-04-13 | Disposition: A | Discharge status: Home / Self Care | Payer: PRIVATE HEALTH INSURANCE | Source: Ambulatory Visit | Attending: Family Medicine | Admitting: Family Medicine

## 2022-04-13 ENCOUNTER — Ambulatory Visit (INDEPENDENT_AMBULATORY_CARE_PROVIDER_SITE_OTHER): Payer: PRIVATE HEALTH INSURANCE | Admitting: Family Medicine

## 2022-04-13 VITALS — BP 148/100 | HR 75 | Temp 98.6°F | Resp 18 | Ht 73.0 in | Wt 387.0 lb

## 2022-04-13 DIAGNOSIS — E1169 Type 2 diabetes mellitus with other specified complication: Secondary | ICD-10-CM

## 2022-04-13 DIAGNOSIS — E785 Hyperlipidemia, unspecified: Secondary | ICD-10-CM

## 2022-04-13 DIAGNOSIS — E1165 Type 2 diabetes mellitus with hyperglycemia: Secondary | ICD-10-CM

## 2022-04-13 DIAGNOSIS — I10 Essential (primary) hypertension: Secondary | ICD-10-CM | POA: Diagnosis not present

## 2022-04-13 DIAGNOSIS — L0211 Cutaneous abscess of neck: Secondary | ICD-10-CM | POA: Diagnosis present

## 2022-04-13 LAB — LIPID PANEL
Cholesterol: 188 mg/dL (ref 0–200)
HDL: 35.2 mg/dL — ABNORMAL LOW (ref >39.00)
NonHDL: 152.61
Total CHOL/HDL Ratio: 5
Triglycerides: 290 mg/dL — ABNORMAL HIGH (ref 0.0–149.0)
VLDL: 58 mg/dL — ABNORMAL HIGH (ref 0.0–40.0)

## 2022-04-13 LAB — CBC WITH DIFFERENTIAL/PLATELET
Basophils Absolute: 0.1 10*3/uL (ref 0.0–0.1)
Basophils Relative: 1.3 % (ref 0.0–3.0)
Eosinophils Absolute: 0.3 10*3/uL (ref 0.0–0.7)
Eosinophils Relative: 2.9 % (ref 0.0–5.0)
HCT: 39.4 % (ref 39.0–52.0)
Hemoglobin: 13 g/dL (ref 13.0–17.0)
Lymphocytes Relative: 30.9 % (ref 12.0–46.0)
Lymphs Abs: 2.8 10*3/uL (ref 0.7–4.0)
MCHC: 33 g/dL (ref 30.0–36.0)
MCV: 96.6 fl (ref 78.0–100.0)
Monocytes Absolute: 1 10*3/uL (ref 0.1–1.0)
Monocytes Relative: 10.6 % (ref 3.0–12.0)
Neutro Abs: 5 10*3/uL (ref 1.4–7.7)
Neutrophils Relative %: 54.3 % (ref 43.0–77.0)
Platelets: 231 10*3/uL (ref 150.0–400.0)
RBC: 4.08 Mil/uL — ABNORMAL LOW (ref 4.22–5.81)
RDW: 12.4 % (ref 11.5–15.5)
WBC: 9.2 10*3/uL (ref 4.0–10.5)

## 2022-04-13 LAB — COMPREHENSIVE METABOLIC PANEL
ALT: 18 U/L (ref 0–53)
AST: 18 U/L (ref 0–37)
Albumin: 3.7 g/dL (ref 3.5–5.2)
Alkaline Phosphatase: 54 U/L (ref 39–117)
BUN: 11 mg/dL (ref 6–23)
CO2: 29 mEq/L (ref 19–32)
Calcium: 9.1 mg/dL (ref 8.4–10.5)
Chloride: 103 mEq/L (ref 96–112)
Creatinine, Ser: 0.96 mg/dL (ref 0.40–1.50)
GFR: 96.47 mL/min (ref >60.00)
Glucose, Bld: 172 mg/dL — ABNORMAL HIGH (ref 70–99)
Potassium: 3.9 mEq/L (ref 3.5–5.1)
Sodium: 139 mEq/L (ref 135–145)
Total Bilirubin: 0.4 mg/dL (ref 0.2–1.2)
Total Protein: 6.7 g/dL (ref 6.0–8.3)

## 2022-04-13 LAB — HEMOGLOBIN A1C: Hgb A1c MFr Bld: 6.9 % — ABNORMAL HIGH (ref 4.6–6.5)

## 2022-04-13 LAB — LDL CHOLESTEROL, DIRECT: Direct LDL: 115 mg/dL

## 2022-04-13 MED ORDER — DOXYCYCLINE HYCLATE 100 MG PO TABS: 100.0000 mg | ORAL_TABLET | Freq: Two times a day (BID) | ORAL | 0 refills | Status: DC

## 2022-04-13 NOTE — Assessment & Plan Note (Signed)
Soft tissue US  abx per orders  Refer to surgery  Warm compresses

## 2022-04-13 NOTE — Patient Instructions (Signed)
Skin Abscess  A skin abscess is an infected area on or under your skin that contains a collection of pus and other material. An abscess may also be called a furuncle, carbuncle, or boil. An abscess can occur in or on almost any part of your body. Some abscesses break open (rupture) on their own. Most continue to get worse unless they are treated. The infection can spread deeper into the body and eventually into your blood, which can make you feel ill. Treatment usually involves draining the abscess. What are the causes? An abscess occurs when germs, like bacteria, pass through your skin and cause an infection. This may be caused by: A scrape or cut on your skin. A puncture wound through your skin, including a needle injection or insect bite. Blocked oil or sweat glands. Blocked and infected hair follicles. A cyst that forms beneath your skin (sebaceous cyst) and becomes infected. What increases the risk? This condition is more likely to develop in people who: Have a weak body defense system (immune system). Have diabetes. Have dry and irritated skin. Get frequent injections or use illegal IV drugs. Have a foreign body in a wound, such as a splinter. Have problems with their lymph system or veins. What are the signs or symptoms? Symptoms of this condition include: A painful, firm bump under the skin. A bump with pus at the top. This may break through the skin and drain. Other symptoms include: Redness surrounding the abscess site. Warmth. Swelling of the lymph nodes (glands) near the abscess. Tenderness. A sore on the skin. How is this diagnosed? This condition may be diagnosed based on: A physical exam. Your medical history. A sample of pus. This may be used to find out what is causing the infection. Blood tests. Imaging tests, such as an ultrasound, CT scan, or MRI. How is this treated? A small abscess that drains on its own may not need treatment. Treatment for larger abscesses  may include: Moist heat or heat pack applied to the area several times a day. A procedure to drain the abscess (incision and drainage). Antibiotic medicines. For a severe abscess, you may first get antibiotics through an IV and then change to antibiotics by mouth. Follow these instructions at home: Medicines  Take over-the-counter and prescription medicines only as told by your health care provider. If you were prescribed an antibiotic medicine, take it as told by your health care provider. Do not stop taking the antibiotic even if you start to feel better. Abscess care  If you have an abscess that has not drained, apply heat to the affected area. Use the heat source that your health care provider recommends, such as a moist heat pack or a heating pad. Place a towel between your skin and the heat source. Leave the heat on for 20-30 minutes. Remove the heat if your skin turns bright red. This is especially important if you are unable to feel pain, heat, or cold. You may have a greater risk of getting burned. Follow instructions from your health care provider about how to take care of your abscess. Make sure you: Cover the abscess with a bandage (dressing). Change your dressing or gauze as told by your health care provider. Wash your hands with soap and water before you change the dressing or gauze. If soap and water are not available, use hand sanitizer. Check your abscess every day for signs of a worsening infection. Check for: More redness, swelling, or pain. More fluid or blood. Warmth.   More pus or a bad smell. General instructions To avoid spreading the infection: Do not share personal care items, towels, or hot tubs with others. Avoid making skin contact with other people. Keep all follow-up visits as told by your health care provider. This is important. Contact a health care provider if you have: More redness, swelling, or pain around your abscess. More fluid or blood coming from  your abscess. Warm skin around your abscess. More pus or a bad smell coming from your abscess. Muscle aches. Chills or a general ill feeling. Get help right away if you: Have severe pain. See red streaks on your skin spreading away from the abscess. See redness that spreads quickly. Have a fever or chills. Summary A skin abscess is an infected area on or under your skin that contains a collection of pus and other material. A small abscess that drains on its own may not need treatment. Treatment for larger abscesses may include having a procedure to drain the abscess and taking an antibiotic. This information is not intended to replace advice given to you by your health care provider. Make sure you discuss any questions you have with your health care provider. Document Revised: 10/23/2021 Document Reviewed: 04/28/2021 Elsevier Patient Education  2023 Elsevier Inc.  

## 2022-04-13 NOTE — Progress Notes (Signed)
Subjective:   By signing my name below, I, [name], attest that this documentation has been prepared under the direction and in the presence of Ann Held DO 04/13/2022     Patient ID: [facility], male    DOB: 1976/06/13, 46 y.o.   MRN: [id number]  Chief Complaint  Patient presents with   Neck Pain    Pt states pain started x2 weeks ago. Pt states having some swelling. Pt states neck is tight and having trouble with movement.      HPI Patient is in today for an office visit  He complains of an abscess on the back of his right neck. Symptoms appeared about two weeks ago. The area is painful and tight. He states that he has troubles sleeping on the right side of his neck due to the pain. He denies of any itchiness or drainage of the area. He uses a heating pad to the area to help alleviate symptoms   Past Medical History:  Diagnosis Date   Arthritis    right knee; left shoulder   Diabetes mellitus without complication ([place])    Hyperlipidemia    Hypertension    takes metoprolol daily   Sleep apnea    sleep study - recommended cpap    Past Surgical History:  Procedure Laterality Date   KNEE SURGERY  2009   right   NASAL SEPTOPLASTY W/ TURBINOPLASTY  12/03/2011   Procedure: NASAL SEPTOPLASTY WITH TURBINATE REDUCTION;  Surgeon: Izora Gala, MD;  Location[facility] OR;  Service: ENT;  Laterality: Bilateral;   NASAL SINUS SURGERY  12/03/2011   Procedure: ENDOSCOPIC SINUS SURGERY;  Surgeon: Izora Gala, MD;  Location: [facility] OR;  Service: ENT;  Laterality: Bilateral;    Family History  Problem Relation Age of Onset   Diabetes Mother    Allergies Mother    Hyperlipidemia Father    Hypertension Father    Diabetes Father    Anesthesia problems Neg Hx    Hypotension Neg Hx    Malignant hyperthermia Neg Hx    Pseudochol deficiency Neg Hx    Colon cancer Neg Hx    Esophageal cancer Neg Hx     Social History   Socioeconomic History   Marital status: Single    Spouse  name: Not on file   Number of children: 0   Years of education: Not on file   Highest education level: Not on file  Occupational History   Occupation: Contractor: [employer]  Tobacco Use   Smoking status: Some Days    Packs/day: 0.50    Years: 6.00    Total pack years: 3.00    Types: Cigars, Cigarettes    Last attempt to quit: 11/02/2010    Years since quitting: 11.4   Smokeless tobacco: Never  Vaping Use   Vaping Use: Never used  Substance and Sexual Activity   Alcohol use: Yes    Comment: ocassionally    Drug use: No   Sexual activity: Not on file  Other Topics Concern   Not on file  Social History Narrative   Not on file   Social Determinants of Health   Financial Resource Strain: Low Risk  (11/13/2020)   Overall Financial Resource Strain (CARDIA)    Difficulty of Paying Living Expenses: Not hard at all  Food Insecurity: Not on file  Transportation Needs: Not on file  Physical Activity: Not on file  Stress: Not on file  Social Connections:  Not on file  Intimate Partner Violence: Not on file    Outpatient Medications Prior to Visit  Medication Sig Dispense Refill   AIRDUO DIGIHALER 113-14 MCG/ACT AEPB Inhale 1 puff into the lungs twice daily. Rinse, gargle and spit after use. (Patient taking differently: Inhale 1 puff into the lungs daily. Inhale 1 puff into the lungs daily. Rinse, gargle and spit after use.) 1 each 5   amLODipine (NORVASC) 10 MG tablet TAKE 1 TABLET BY MOUTH EVERY DAY 90 tablet 1   Azelastine HCl 137 MCG/SPRAY SOLN INSTILL 1 SPRAY INTO EACH NOSTRIL ONCE DAILY EVERY NIGHT BEFORE CPAP USE 30 mL 0   celecoxib (CELEBREX) 200 MG capsule Take 1 capsule (200 mg total) by mouth 2 (two) times daily. 60 capsule 3   chlorthalidone (HYGROTON) 25 MG tablet Take 1 tablet (25 mg total) by mouth daily. 90 tablet 1   Continuous Blood Gluc Sensor (FREESTYLE LIBRE 3 SENSOR) MISC 1 each by Does not apply route every 14 (fourteen) days. Place 1 sensor  on the skin every 14 days. Use to check glucose continuously. 2 each 5   fenofibrate 160 MG tablet Take 1 tablet (160 mg total) by mouth daily. 90 tablet 0   fluticasone (FLONASE) 50 MCG/ACT nasal spray INSTILL 2 SPRAYS INTO EACH NOSTRIL ONCE DAILY EACH NIGHT BEFORE CPAP USE 48 mL 0   furosemide (LASIX) 20 MG tablet TAKE 1 TABLET BY MOUTH EVERY DAY 90 tablet 0   gabapentin (NEURONTIN) 300 MG capsule Take 1 capsule (300 mg total) by mouth 2 (two) times daily. 180 capsule 1   glucose blood (ONETOUCH VERIO) test strip Check blood sugars once daily and as needed. 100 each 12   losartan (COZAAR) 50 MG tablet Take 1 tablet (50 mg total) by mouth daily. 90 tablet 1   metoprolol (TOPROL-XL) 200 MG 24 hr tablet Take 1 tablet (200 mg total) by mouth daily. Take with or immediately following a meal 90 tablet 0   montelukast (SINGULAIR) 10 MG tablet TAKE 1 TABLET BY MOUTH EVERY DAY AT BEDTIME 90 tablet 0   ONETOUCH DELICA LANCETS 16R MISC Check blood sugars once daily and as needed. 100 each 12   PROAIR DIGIHALER 108 MCG/ACT AEPB Inhale 2 puffs into the lungs every 4-6 hours as needed for cough, wheeze, shortness of breath or chest tightness. 1 each 1   rosuvastatin (CRESTOR) 20 MG tablet Take 1 tablet (20 mg total) by mouth at bedtime. 90 tablet 0   Semaglutide (RYBELSUS) 14 MG TABS Take 1 tablet (14 mg total) by mouth daily. Take on an empty stomach when you first wake up with a sip of plain water (no more than 4 ounces) After 30 minutes, you can eat, drink, or take other oral medicines 30 tablet 5   Facility-Administered Medications Prior to Visit  Medication Dose Route Frequency Provider Last Rate Last Admin   dexamethasone (DECADRON) injection 15 mg  15 mg Other Once Magnus Sinning, MD        No Known Allergies  Review of Systems  Constitutional:  Negative for fever and malaise/fatigue.  HENT:  Negative for congestion.   Eyes:  Negative for blurred vision.  Respiratory:  Negative for cough and  shortness of breath.   Cardiovascular:  Negative for chest pain, palpitations and leg swelling.  Gastrointestinal:  Negative for vomiting.  Musculoskeletal:  Negative for back pain.  Skin:  Negative for itching and rash.       (+) Abscess on Back of  Right Neck (-) Abscess Drainage (+) Tenderness   Neurological:  Negative for loss of consciousness and headaches.       Objective:    Physical Exam Vitals and nursing note reviewed.  Constitutional:      General: He is not in acute distress.    Appearance: Normal appearance. He is not ill-appearing.  HENT:     Head: Normocephalic and atraumatic.     Right Ear: External ear normal.     Left Ear: External ear normal.  Eyes:     Extraocular Movements: Extraocular movements intact.     Pupils: Pupils are equal, round, and reactive to light.  Cardiovascular:     Rate and Rhythm: Normal rate and regular rhythm.     Heart sounds: Normal heart sounds. No murmur heard.    No gallop.  Pulmonary:     Effort: Pulmonary effort is normal. No respiratory distress.     Breath sounds: Normal breath sounds. No wheezing or rales.  Skin:    General: Skin is warm and dry.     Findings: Abscess (3 inch diameter, tender) present.     Comments: No erythema  No Fever + large area of tenderness and fluctuance back of the R side neck   Neurological:     Mental Status: He is alert and oriented to person, place, and time.  Psychiatric:        Judgment: Judgment normal.     BP (!) 148/100 (BP Location: Left Arm, Patient Position: Sitting, Cuff Size: Large)   Pulse 75   Temp 98.6 F (37 C) (Oral)   Resp 18   Ht '6\' 1"'$  (1.854 m)   Wt (!) 387 lb (175.5 kg)   SpO2 94%   BMI 51.06 kg/m  Wt Readings from Last 3 Encounters:  04/13/22 (!) 387 lb (175.5 kg)  12/18/21 (!) 370 lb (167.8 kg)  11/10/21 (!) 370 lb 9.6 oz (168.1 kg)    Diabetic Foot Exam - Simple   No data filed    Lab Results  Component Value Date   WBC 11.1 (H) 10/10/2021   HGB  14.4 10/10/2021   HCT 43.8 10/10/2021   PLT 261 10/10/2021   GLUCOSE 120 (H) 10/10/2021   CHOL 180 10/10/2021   TRIG 224 (H) 10/10/2021   HDL 36 (L) 10/10/2021   LDLDIRECT 119.0 11/27/2019   LDLCALC 110 (H) 10/10/2021   ALT 18 10/10/2021   AST 24 10/10/2021   NA 141 10/10/2021   K 4.2 10/10/2021   CL 107 10/10/2021   CREATININE 0.80 10/10/2021   BUN 13 10/10/2021   CO2 27 10/10/2021   TSH 1.58 10/25/2020   PSA 0.29 10/25/2020   INR 0.93 01/16/2013   HGBA1C 6.2 (H) 10/10/2021   MICROALBUR 0.4 10/10/2021    Lab Results  Component Value Date   TSH 1.58 10/25/2020   Lab Results  Component Value Date   WBC 11.1 (H) 10/10/2021   HGB 14.4 10/10/2021   HCT 43.8 10/10/2021   MCV 96.1 10/10/2021   PLT 261 10/10/2021   Lab Results  Component Value Date   NA 141 10/10/2021   K 4.2 10/10/2021   CO2 27 10/10/2021   GLUCOSE 120 (H) 10/10/2021   BUN 13 10/10/2021   CREATININE 0.80 10/10/2021   BILITOT 0.5 10/10/2021   ALKPHOS 63 11/27/2019   AST 24 10/10/2021   ALT 18 10/10/2021   PROT 7.1 10/10/2021   ALBUMIN 4.0 11/27/2019   CALCIUM 9.3 10/10/2021  GFR 121.47 11/27/2019   Lab Results  Component Value Date   CHOL 180 10/10/2021   Lab Results  Component Value Date   HDL 36 (L) 10/10/2021   Lab Results  Component Value Date   LDLCALC 110 (H) 10/10/2021   Lab Results  Component Value Date   TRIG 224 (H) 10/10/2021   Lab Results  Component Value Date   CHOLHDL 5.0 (H) 10/10/2021   Lab Results  Component Value Date   HGBA1C 6.2 (H) 10/10/2021       Assessment & Plan:   Problem List Items Addressed This Visit       Unprioritized   Severe obesity (BMI >= 40) ([place])   Diabetes mellitus, type II ([place])   Relevant Orders   CBC with Differential/Platelet   Comprehensive metabolic panel   Lipid panel   Hemoglobin A1c   Abscess, neck - Primary    Soft tissue US  abx per orders  Refer to surgery  Warm compresses       Relevant Medications    doxycycline (VIBRA-TABS) 100 MG tablet   Other Relevant Orders   Ambulatory referral to General Surgery   US SOFT TISSUE HEAD & NECK (NON-THYROID)   CBC with Differential/Platelet   Other Visit Diagnoses     Hyperlipidemia associated with type 2 diabetes mellitus ([place])       Relevant Orders   CBC with Differential/Platelet   Comprehensive metabolic panel   Lipid panel   Hemoglobin A1c   Primary hypertension       Relevant Orders   CBC with Differential/Platelet   Comprehensive metabolic panel   Lipid panel   Hemoglobin A1c      Meds ordered this encounter  Medications   doxycycline (VIBRA-TABS) 100 MG tablet    Sig: Take 1 tablet (100 mg total) by mouth 2 (two) times daily.    Dispense:  20 tablet    Refill:  0    I, Ann Held, DO, personally preformed the services described in this documentation.  All medical record entries made by the scribe were at my direction and in my presence.  I have reviewed the chart and discharge instructions (if applicable) and agree that the record reflects my personal performance and is accurate and complete. 04/13/2022   I,Amber Collins,acting as a scribe for [employer], DO.,have documented all relevant documentation on the behalf of Ann Held, DO,as directed by  Ann Held, DO while in the presence of Ann Held, DO.    Ann Held, DO

## 2022-04-18 ENCOUNTER — Other Ambulatory Visit: Payer: Self-pay | Admitting: Family Medicine

## 2022-04-18 DIAGNOSIS — E1169 Type 2 diabetes mellitus with other specified complication: Secondary | ICD-10-CM

## 2022-04-18 DIAGNOSIS — E1165 Type 2 diabetes mellitus with hyperglycemia: Secondary | ICD-10-CM

## 2022-04-21 ENCOUNTER — Other Ambulatory Visit: Payer: Self-pay

## 2022-04-21 MED ORDER — ROSUVASTATIN CALCIUM 40 MG PO TABS: 40.0000 mg | ORAL_TABLET | Freq: Every day | ORAL | 2 refills | Status: DC

## 2022-04-28 NOTE — Progress Notes (Signed)
Appointment was cancelled. Patient was not seen or examined.

## 2022-05-16 ENCOUNTER — Encounter: Payer: Self-pay | Admitting: Family Medicine

## 2022-05-16 DIAGNOSIS — J3089 Other allergic rhinitis: Secondary | ICD-10-CM

## 2022-05-16 DIAGNOSIS — M545 Low back pain, unspecified: Secondary | ICD-10-CM

## 2022-05-16 DIAGNOSIS — I10 Essential (primary) hypertension: Secondary | ICD-10-CM

## 2022-05-18 MED ORDER — GABAPENTIN 300 MG PO CAPS: 300.0000 mg | ORAL_CAPSULE | Freq: Two times a day (BID) | ORAL | 0 refills | Status: AC

## 2022-05-18 MED ORDER — RYBELSUS 14 MG PO TABS: 1.0000 | ORAL_TABLET | Freq: Every day | ORAL | 0 refills | Status: DC

## 2022-05-18 MED ORDER — ROSUVASTATIN CALCIUM 40 MG PO TABS: 40.0000 mg | ORAL_TABLET | Freq: Every day | ORAL | 0 refills | Status: DC

## 2022-05-18 MED ORDER — METOPROLOL SUCCINATE ER 200 MG PO TB24: 200.0000 mg | ORAL_TABLET | Freq: Every day | ORAL | 0 refills | Status: DC

## 2022-05-18 MED ORDER — FENOFIBRATE 160 MG PO TABS: 160.0000 mg | ORAL_TABLET | Freq: Every day | ORAL | 0 refills | Status: DC

## 2022-05-18 MED ORDER — LOSARTAN POTASSIUM 50 MG PO TABS: 50.0000 mg | ORAL_TABLET | Freq: Every day | ORAL | 0 refills | Status: DC

## 2022-05-18 MED ORDER — AMLODIPINE BESYLATE 10 MG PO TABS: 10.0000 mg | ORAL_TABLET | Freq: Every day | ORAL | 0 refills | Status: DC

## 2022-05-18 MED ORDER — FUROSEMIDE 20 MG PO TABS: 20.0000 mg | ORAL_TABLET | Freq: Every day | ORAL | 0 refills | Status: DC

## 2022-05-18 MED ORDER — MONTELUKAST SODIUM 10 MG PO TABS: 10.0000 mg | ORAL_TABLET | Freq: Every day | ORAL | 0 refills | Status: DC

## 2022-05-18 MED ORDER — CHLORTHALIDONE 25 MG PO TABS: 25.0000 mg | ORAL_TABLET | Freq: Every day | ORAL | 0 refills | Status: DC

## 2022-07-04 ENCOUNTER — Other Ambulatory Visit: Payer: Self-pay | Admitting: Family Medicine

## 2022-07-04 ENCOUNTER — Encounter: Payer: Self-pay | Admitting: Family Medicine

## 2022-07-04 DIAGNOSIS — M25562 Pain in left knee: Secondary | ICD-10-CM

## 2022-07-06 MED ORDER — CELECOXIB 200 MG PO CAPS: 200.0000 mg | ORAL_CAPSULE | Freq: Two times a day (BID) | ORAL | 3 refills | Status: DC

## 2022-07-06 MED ORDER — RYBELSUS 14 MG PO TABS: 1.0000 | ORAL_TABLET | Freq: Every day | ORAL | 0 refills | Status: DC

## 2022-11-16 ENCOUNTER — Encounter: Payer: Self-pay | Admitting: *Deleted

## 2022-11-18 ENCOUNTER — Encounter: Payer: Self-pay | Admitting: *Deleted

## 2022-12-11 ENCOUNTER — Ambulatory Visit (INDEPENDENT_AMBULATORY_CARE_PROVIDER_SITE_OTHER): Payer: PRIVATE HEALTH INSURANCE | Admitting: Family Medicine

## 2022-12-11 VITALS — BP 148/102 | HR 90 | Temp 97.8°F | Resp 20 | Ht 73.0 in | Wt 373.2 lb

## 2022-12-11 DIAGNOSIS — E1169 Type 2 diabetes mellitus with other specified complication: Secondary | ICD-10-CM | POA: Diagnosis not present

## 2022-12-11 DIAGNOSIS — J3089 Other allergic rhinitis: Secondary | ICD-10-CM

## 2022-12-11 DIAGNOSIS — I10 Essential (primary) hypertension: Secondary | ICD-10-CM | POA: Diagnosis not present

## 2022-12-11 DIAGNOSIS — J309 Allergic rhinitis, unspecified: Secondary | ICD-10-CM

## 2022-12-11 DIAGNOSIS — E1165 Type 2 diabetes mellitus with hyperglycemia: Secondary | ICD-10-CM

## 2022-12-11 DIAGNOSIS — M25562 Pain in left knee: Secondary | ICD-10-CM

## 2022-12-11 DIAGNOSIS — E785 Hyperlipidemia, unspecified: Secondary | ICD-10-CM

## 2022-12-11 DIAGNOSIS — M545 Low back pain, unspecified: Secondary | ICD-10-CM | POA: Diagnosis not present

## 2022-12-11 DIAGNOSIS — F40240 Claustrophobia: Secondary | ICD-10-CM

## 2022-12-11 LAB — CBC WITH DIFFERENTIAL/PLATELET
Basophils Absolute: 96 cells/uL (ref 0–200)
Eosinophils Absolute: 247 cells/uL (ref 15–500)
Eosinophils Relative: 1.8 %
Lymphs Abs: 3631 cells/uL (ref 850–3900)
MPV: 11.7 fL (ref 7.5–12.5)
WBC: 13.7 10*3/uL — ABNORMAL HIGH (ref 3.8–10.8)

## 2022-12-11 MED ORDER — MONTELUKAST SODIUM 10 MG PO TABS: 10.0000 mg | ORAL_TABLET | Freq: Every day | ORAL | 3 refills | Status: DC

## 2022-12-11 MED ORDER — DIAZEPAM 5 MG PO TABS
5.0000 mg | ORAL_TABLET | Freq: Four times a day (QID) | ORAL | 0 refills | Status: AC | PRN
Start: 2022-12-11 — End: ?

## 2022-12-11 MED ORDER — CELECOXIB 200 MG PO CAPS: 200.0000 mg | ORAL_CAPSULE | Freq: Two times a day (BID) | ORAL | 3 refills | Status: DC

## 2022-12-11 MED ORDER — AZELASTINE HCL 137 MCG/SPRAY NA SOLN: NASAL | 5 refills | Status: DC

## 2022-12-11 MED ORDER — LOSARTAN POTASSIUM 100 MG PO TABS
100.0000 mg | ORAL_TABLET | Freq: Every day | ORAL | 3 refills | Status: DC
Start: 2022-12-11 — End: 2024-03-17
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
## 2022-12-11 NOTE — Assessment & Plan Note (Signed)
Poorly controlled will alter medications, encouraged DASH diet, minimize caffeine and obtain adequate sleep. Report concerning symptoms and follow up as directed and as needed  Increase losartan 100 mg daily

## 2022-12-11 NOTE — Assessment & Plan Note (Signed)
Con't flonase and astelin

## 2022-12-11 NOTE — Assessment & Plan Note (Signed)
hgba1c to be checked minimize simple carbs. Increase exercise as tolerated. Continue current meds 

## 2022-12-11 NOTE — Assessment & Plan Note (Signed)
Tolerating statin, encouraged heart healthy diet, avoid trans fats, minimize simple carbs and saturated fats. Increase exercise as tolerated 

## 2022-12-12 LAB — LIPID PANEL
Cholesterol: 209 mg/dL — ABNORMAL HIGH (ref <200)
HDL: 32 mg/dL — ABNORMAL LOW (ref >=40)
Non-HDL Cholesterol (Calc): 177 mg/dL (calc) — ABNORMAL HIGH (ref <130)
Total CHOL/HDL Ratio: 6.5 (calc) — ABNORMAL HIGH (ref <5.0)
Triglycerides: 491 mg/dL — ABNORMAL HIGH (ref <150)

## 2022-12-12 LAB — CBC WITH DIFFERENTIAL/PLATELET
Absolute Monocytes: 1055 cells/uL — ABNORMAL HIGH (ref 200–950)
Basophils Relative: 0.7 %
HCT: 43.5 % (ref 38.5–50.0)
Hemoglobin: 14.5 g/dL (ref 13.2–17.1)
MCH: 31.4 pg (ref 27.0–33.0)
MCHC: 33.3 g/dL (ref 32.0–36.0)
MCV: 94.2 fL (ref 80.0–100.0)
Monocytes Relative: 7.7 %
Neutro Abs: 8672 cells/uL — ABNORMAL HIGH (ref 1500–7800)
Neutrophils Relative %: 63.3 %
Platelets: 256 10*3/uL (ref 140–400)
RBC: 4.62 10*6/uL (ref 4.20–5.80)
RDW: 11.6 % (ref 11.0–15.0)
Total Lymphocyte: 26.5 %

## 2022-12-12 LAB — COMPREHENSIVE METABOLIC PANEL
AG Ratio: 1.4 (calc) (ref 1.0–2.5)
ALT: 18 U/L (ref 9–46)
AST: 21 U/L (ref 10–40)
Albumin: 4.2 g/dL (ref 3.6–5.1)
Alkaline phosphatase (APISO): 63 U/L (ref 36–130)
BUN: 15 mg/dL (ref 7–25)
CO2: 28 mmol/L (ref 20–32)
Calcium: 9.5 mg/dL (ref 8.6–10.3)
Chloride: 105 mmol/L (ref 98–110)
Creat: 1.08 mg/dL (ref 0.60–1.29)
Globulin: 3 g/dL (calc) (ref 1.9–3.7)
Glucose, Bld: 167 mg/dL — ABNORMAL HIGH (ref 65–99)
Potassium: 4 mmol/L (ref 3.5–5.3)
Sodium: 142 mmol/L (ref 135–146)
Total Bilirubin: 0.5 mg/dL (ref 0.2–1.2)
Total Protein: 7.2 g/dL (ref 6.1–8.1)

## 2022-12-12 LAB — HEMOGLOBIN A1C
Hgb A1c MFr Bld: 8 % of total Hgb — ABNORMAL HIGH (ref <5.7)
Mean Plasma Glucose: 183 mg/dL
eAG (mmol/L): 10.1 mmol/L

## 2022-12-12 LAB — MICROALBUMIN / CREATININE URINE RATIO
Creatinine, Urine: 90 mg/dL (ref 20–320)
Microalb Creat Ratio: 12 mg/g creat (ref <30)
Microalb, Ur: 1.1 mg/dL

## 2022-12-15 ENCOUNTER — Other Ambulatory Visit: Payer: Self-pay | Admitting: Family Medicine

## 2022-12-15 DIAGNOSIS — E1165 Type 2 diabetes mellitus with hyperglycemia: Secondary | ICD-10-CM

## 2022-12-15 DIAGNOSIS — E1169 Type 2 diabetes mellitus with other specified complication: Secondary | ICD-10-CM

## 2022-12-15 DIAGNOSIS — I10 Essential (primary) hypertension: Secondary | ICD-10-CM

## 2022-12-16 ENCOUNTER — Other Ambulatory Visit: Payer: Self-pay | Admitting: *Deleted

## 2022-12-16 ENCOUNTER — Telehealth: Payer: Self-pay | Admitting: *Deleted

## 2022-12-16 MED ORDER — METFORMIN HCL ER 500 MG PO TB24: 500.0000 mg | ORAL_TABLET | Freq: Every day | ORAL | 2 refills | Status: DC

## 2022-12-16 MED ORDER — ROSUVASTATIN CALCIUM 40 MG PO TABS: 40.0000 mg | ORAL_TABLET | Freq: Every day | ORAL | 1 refills | Status: AC

## 2022-12-16 NOTE — Telephone Encounter (Signed)
-----   Message from Donato Schultz, DO sent at 12/15/2022  1:15 PM EDT ----- Cholesterol--- LDL goal < 100,  HDL >40,  TG < 150.  Diet and exercise will increase HDL and decrease LDL and TG.  Fish,  Fish Oil, Flaxseed oil will also help increase the HDL and decrease Triglycerides.   Recheck labs in 3 months------.start crestor 10 mg 1 po qhs #30 , 2 refills  Hga1c --- elevated ---  metformin xr 500 mg #30  1 po qd, 2 refills

## 2022-12-16 NOTE — Telephone Encounter (Signed)
Patient has not been taking so sent in refill for 40mg  crestor.

## 2022-12-16 NOTE — Telephone Encounter (Signed)
Pt notified of labs but I noticed patient is already on crestor 40mg .  You said to send in 10mg .  Please advise.

## 2023-01-06 ENCOUNTER — Other Ambulatory Visit: Payer: Self-pay | Admitting: Family Medicine

## 2023-01-06 DIAGNOSIS — I10 Essential (primary) hypertension: Secondary | ICD-10-CM

## 2023-01-06 DIAGNOSIS — E1169 Type 2 diabetes mellitus with other specified complication: Secondary | ICD-10-CM

## 2023-01-06 DIAGNOSIS — E1165 Type 2 diabetes mellitus with hyperglycemia: Secondary | ICD-10-CM

## 2023-02-27 ENCOUNTER — Other Ambulatory Visit: Payer: Self-pay | Admitting: Family Medicine

## 2023-03-18 ENCOUNTER — Other Ambulatory Visit: Payer: PRIVATE HEALTH INSURANCE

## 2023-03-19 ENCOUNTER — Other Ambulatory Visit (INDEPENDENT_AMBULATORY_CARE_PROVIDER_SITE_OTHER): Payer: PRIVATE HEALTH INSURANCE

## 2023-03-19 DIAGNOSIS — I10 Essential (primary) hypertension: Secondary | ICD-10-CM

## 2023-03-19 DIAGNOSIS — E1165 Type 2 diabetes mellitus with hyperglycemia: Secondary | ICD-10-CM

## 2023-03-19 DIAGNOSIS — E1169 Type 2 diabetes mellitus with other specified complication: Secondary | ICD-10-CM | POA: Diagnosis not present

## 2023-03-19 DIAGNOSIS — E785 Hyperlipidemia, unspecified: Secondary | ICD-10-CM

## 2023-03-19 LAB — CBC WITH DIFFERENTIAL/PLATELET
Basophils Absolute: 0.1 10*3/uL (ref 0.0–0.1)
Basophils Relative: 0.5 % (ref 0.0–3.0)
Eosinophils Absolute: 0.3 10*3/uL (ref 0.0–0.7)
Eosinophils Relative: 2.7 % (ref 0.0–5.0)
HCT: 44.6 % (ref 39.0–52.0)
Hemoglobin: 14.3 g/dL (ref 13.0–17.0)
Lymphocytes Relative: 28.3 % (ref 12.0–46.0)
Lymphs Abs: 3.5 10*3/uL (ref 0.7–4.0)
MCHC: 32.1 g/dL (ref 30.0–36.0)
MCV: 97 fl (ref 78.0–100.0)
Monocytes Absolute: 0.8 10*3/uL (ref 0.1–1.0)
Monocytes Relative: 6.6 % (ref 3.0–12.0)
Neutro Abs: 7.7 10*3/uL (ref 1.4–7.7)
Neutrophils Relative %: 61.9 % (ref 43.0–77.0)
Platelets: 229 10*3/uL (ref 150.0–400.0)
RBC: 4.59 Mil/uL (ref 4.22–5.81)
RDW: 12.3 % (ref 11.5–15.5)
WBC: 12.4 10*3/uL — ABNORMAL HIGH (ref 4.0–10.5)

## 2023-03-19 LAB — COMPREHENSIVE METABOLIC PANEL
ALT: 19 U/L (ref 0–53)
AST: 20 U/L (ref 0–37)
Albumin: 4.1 g/dL (ref 3.5–5.2)
Alkaline Phosphatase: 63 U/L (ref 39–117)
BUN: 13 mg/dL (ref 6–23)
CO2: 29 meq/L (ref 19–32)
Calcium: 9.1 mg/dL (ref 8.4–10.5)
Chloride: 100 meq/L (ref 96–112)
Creatinine, Ser: 0.85 mg/dL (ref 0.40–1.50)
GFR: 105.36 mL/min (ref >60.00)
Glucose, Bld: 156 mg/dL — ABNORMAL HIGH (ref 70–99)
Potassium: 3.8 meq/L (ref 3.5–5.1)
Sodium: 136 meq/L (ref 135–145)
Total Bilirubin: 0.6 mg/dL (ref 0.2–1.2)
Total Protein: 6.9 g/dL (ref 6.0–8.3)

## 2023-03-19 LAB — LIPID PANEL
Cholesterol: 191 mg/dL (ref 0–200)
HDL: 27.6 mg/dL — ABNORMAL LOW (ref >39.00)
NonHDL: 163.37
Total CHOL/HDL Ratio: 7
Triglycerides: 327 mg/dL — ABNORMAL HIGH (ref 0.0–149.0)
VLDL: 65.4 mg/dL — ABNORMAL HIGH (ref 0.0–40.0)

## 2023-03-19 LAB — LDL CHOLESTEROL, DIRECT: Direct LDL: 119 mg/dL

## 2023-03-22 LAB — HEMOGLOBIN A1C: Hgb A1c MFr Bld: 7.1 % — ABNORMAL HIGH (ref 4.6–6.5)

## 2023-06-07 ENCOUNTER — Other Ambulatory Visit: Payer: Self-pay | Admitting: Family Medicine

## 2023-06-07 DIAGNOSIS — I10 Essential (primary) hypertension: Secondary | ICD-10-CM

## 2023-06-07 MED ORDER — METOPROLOL SUCCINATE ER 200 MG PO TB24
200.0000 mg | ORAL_TABLET | Freq: Every day | ORAL | 0 refills | Status: DC
Start: 2023-06-07 — End: 2024-03-17

## 2023-06-28 ENCOUNTER — Ambulatory Visit: Payer: PRIVATE HEALTH INSURANCE | Admitting: Family Medicine

## 2023-07-11 ENCOUNTER — Other Ambulatory Visit: Payer: Self-pay | Admitting: Family Medicine

## 2023-07-16 ENCOUNTER — Other Ambulatory Visit: Payer: Self-pay | Admitting: *Deleted

## 2023-07-16 DIAGNOSIS — I10 Essential (primary) hypertension: Secondary | ICD-10-CM

## 2023-07-16 MED ORDER — AMLODIPINE BESYLATE 10 MG PO TABS: 10.0000 mg | ORAL_TABLET | Freq: Every day | ORAL | 0 refills | Status: DC

## 2023-07-16 MED ORDER — FENOFIBRATE 160 MG PO TABS: 160.0000 mg | ORAL_TABLET | Freq: Every day | ORAL | 0 refills | Status: DC

## 2023-07-16 MED ORDER — FUROSEMIDE 20 MG PO TABS: 20.0000 mg | ORAL_TABLET | Freq: Every day | ORAL | 0 refills | Status: DC

## 2023-07-16 NOTE — Addendum Note (Signed)
Addended by: Thelma Barge D on: 07/16/2023 01:41 PM   Modules accepted: Orders

## 2023-07-16 NOTE — Addendum Note (Signed)
Addended by: Thelma Barge D on: 07/16/2023 12:21 PM   Modules accepted: Orders

## 2023-07-21 ENCOUNTER — Ambulatory Visit: Payer: PRIVATE HEALTH INSURANCE | Admitting: Orthopedic Surgery

## 2023-11-05 ENCOUNTER — Ambulatory Visit: Payer: PRIVATE HEALTH INSURANCE | Admitting: Family Medicine

## 2023-11-05 VITALS — BP 158/98 | HR 97 | Temp 98.8°F | Resp 20 | Ht 73.0 in | Wt 367.4 lb

## 2023-11-05 DIAGNOSIS — I10 Essential (primary) hypertension: Secondary | ICD-10-CM

## 2023-11-05 DIAGNOSIS — M25562 Pain in left knee: Secondary | ICD-10-CM | POA: Diagnosis not present

## 2023-11-05 DIAGNOSIS — E1165 Type 2 diabetes mellitus with hyperglycemia: Secondary | ICD-10-CM | POA: Diagnosis not present

## 2023-11-05 DIAGNOSIS — E1169 Type 2 diabetes mellitus with other specified complication: Secondary | ICD-10-CM

## 2023-11-05 DIAGNOSIS — M1712 Unilateral primary osteoarthritis, left knee: Secondary | ICD-10-CM

## 2023-11-05 DIAGNOSIS — G8929 Other chronic pain: Secondary | ICD-10-CM

## 2023-11-05 DIAGNOSIS — E785 Hyperlipidemia, unspecified: Secondary | ICD-10-CM | POA: Diagnosis not present

## 2023-11-05 LAB — CBC WITH DIFFERENTIAL/PLATELET
Basophils Absolute: 0.1 10*3/uL (ref 0.0–0.1)
Basophils Relative: 0.6 % (ref 0.0–3.0)
Eosinophils Absolute: 0.3 10*3/uL (ref 0.0–0.7)
Eosinophils Relative: 2.7 % (ref 0.0–5.0)
HCT: 45 % (ref 39.0–52.0)
Hemoglobin: 14.9 g/dL (ref 13.0–17.0)
Lymphocytes Relative: 27.1 % (ref 12.0–46.0)
Lymphs Abs: 3.4 10*3/uL (ref 0.7–4.0)
MCHC: 33.2 g/dL (ref 30.0–36.0)
MCV: 95.2 fl (ref 78.0–100.0)
Monocytes Absolute: 0.9 10*3/uL (ref 0.1–1.0)
Monocytes Relative: 6.9 % (ref 3.0–12.0)
Neutro Abs: 7.8 10*3/uL — ABNORMAL HIGH (ref 1.4–7.7)
Neutrophils Relative %: 62.7 % (ref 43.0–77.0)
Platelets: 236 10*3/uL (ref 150.0–400.0)
RBC: 4.72 Mil/uL (ref 4.22–5.81)
RDW: 12.3 % (ref 11.5–15.5)
WBC: 12.4 10*3/uL — ABNORMAL HIGH (ref 4.0–10.5)

## 2023-11-05 LAB — COMPREHENSIVE METABOLIC PANEL WITH GFR
ALT: 21 U/L (ref 0–53)
AST: 22 U/L (ref 0–37)
Albumin: 4.5 g/dL (ref 3.5–5.2)
Alkaline Phosphatase: 70 U/L (ref 39–117)
BUN: 12 mg/dL (ref 6–23)
CO2: 28 meq/L (ref 19–32)
Calcium: 9.5 mg/dL (ref 8.4–10.5)
Chloride: 100 meq/L (ref 96–112)
Creatinine, Ser: 0.83 mg/dL (ref 0.40–1.50)
GFR: 105.65 mL/min (ref >60.00)
Glucose, Bld: 230 mg/dL — ABNORMAL HIGH (ref 70–99)
Potassium: 4 meq/L (ref 3.5–5.1)
Sodium: 139 meq/L (ref 135–145)
Total Bilirubin: 0.6 mg/dL (ref 0.2–1.2)
Total Protein: 7.4 g/dL (ref 6.0–8.3)

## 2023-11-05 LAB — LIPID PANEL
Cholesterol: 207 mg/dL — ABNORMAL HIGH (ref 0–200)
HDL: 36.5 mg/dL — ABNORMAL LOW (ref >39.00)
NonHDL: 170.66
Total CHOL/HDL Ratio: 6
Triglycerides: 508 mg/dL — ABNORMAL HIGH (ref 0.0–149.0)
VLDL: 101.6 mg/dL — ABNORMAL HIGH (ref 0.0–40.0)

## 2023-11-05 LAB — MICROALBUMIN / CREATININE URINE RATIO
Creatinine,U: 91.2 mg/dL
Microalb Creat Ratio: 49.3 mg/g — ABNORMAL HIGH (ref 0.0–30.0)
Microalb, Ur: 4.5 mg/dL — ABNORMAL HIGH (ref 0.0–1.9)

## 2023-11-05 LAB — HEMOGLOBIN A1C: Hgb A1c MFr Bld: 9.4 % — ABNORMAL HIGH (ref 4.6–6.5)

## 2023-11-05 LAB — LDL CHOLESTEROL, DIRECT: Direct LDL: 104 mg/dL

## 2023-11-05 MED ORDER — CHLORTHALIDONE 25 MG PO TABS: 25.0000 mg | ORAL_TABLET | Freq: Every day | ORAL | 3 refills | Status: AC

## 2023-11-05 MED ORDER — BLOOD GLUCOSE TEST VI STRP: 1.0000 | ORAL_STRIP | Freq: Three times a day (TID) | 0 refills | Status: AC

## 2023-11-05 MED ORDER — RYBELSUS 3 MG PO TABS: 3.0000 mg | ORAL_TABLET | Freq: Every day | ORAL | 0 refills | Status: DC

## 2023-11-05 MED ORDER — LANCET DEVICE MISC: 1.0000 | Freq: Three times a day (TID) | 0 refills | Status: AC

## 2023-11-05 MED ORDER — BLOOD GLUCOSE MONITORING SUPPL DEVI: 1.0000 | Freq: Three times a day (TID) | 0 refills | Status: AC

## 2023-11-05 MED ORDER — LANCETS MISC. MISC: 1.0000 | Freq: Three times a day (TID) | 0 refills | Status: AC
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
## 2023-11-05 NOTE — Patient Instructions (Signed)

## 2023-11-06 NOTE — Assessment & Plan Note (Signed)
 Refer to ortho Ice Tylenol / IB

## 2023-11-06 NOTE — Assessment & Plan Note (Signed)
 Refer to ortho.

## 2023-11-06 NOTE — Assessment & Plan Note (Signed)
 Encourage heart healthy diet such as MIND or DASH diet, increase exercise, avoid trans fats, simple carbohydrates and processed foods, consider a krill or fish or flaxseed oil cap daily.

## 2023-11-09 ENCOUNTER — Telehealth: Payer: Self-pay

## 2023-11-09 NOTE — Telephone Encounter (Addendum)
 Pharmacy Patient Advocate Encounter   Received notification from CoverMyMeds that prior authorization for Rybelsus 3MG  tablets is required/requested.   Insurance verification completed.   The patient is insured through [employer] [employer] .   Per test claim: PA required; PA submitted to above mentioned insurance via CoverMyMeds Key/confirmation #/EOC [employer] WAITING ON QUESTIONS FROM PLAN

## 2023-11-10 ENCOUNTER — Other Ambulatory Visit (HOSPITAL_COMMUNITY): Payer: Self-pay

## 2023-11-10 NOTE — Telephone Encounter (Signed)
 Clinical questions have been answered and PA submitted. PA currently Pending.

## 2023-11-12 NOTE — Telephone Encounter (Signed)
 Pharmacy Patient Advocate Encounter  Received notification from [employer] that Prior Authorization for Rybelsus 3MG  tablets  has been DENIED.  Full denial letter will be uploaded to the media tab. See denial reason below.   PA #/Case ID/Reference #: Annell Greening

## 2023-11-15 NOTE — Telephone Encounter (Signed)
Rx was denied

## 2023-12-23 ENCOUNTER — Other Ambulatory Visit (HOSPITAL_COMMUNITY): Payer: Self-pay

## 2023-12-23 ENCOUNTER — Telehealth: Payer: Self-pay

## 2023-12-23 NOTE — Telephone Encounter (Signed)
 Pharmacy Patient Advocate Encounter   Received notification from Patient Pharmacy that prior authorization for Rybelsus  3 is required/requested.        Per test claim: Unable to verify patient insurance. Last insurance card in patient media dated 10/31/21. Please advise.

## 2023-12-24 ENCOUNTER — Other Ambulatory Visit (HOSPITAL_COMMUNITY): Payer: Self-pay

## 2023-12-24 NOTE — Telephone Encounter (Signed)
 Copied from CRM [id number]. Topic: General - Other >> Dec 24, 2023 10:34 AM Jenice Mitts wrote: Reason for CRM: Patient is calling because he would like to know the status of Rybelsus  prior auth He can be reached at his mobile number

## 2023-12-24 NOTE — Telephone Encounter (Signed)
 PA request has been Denied. For additional info see Pharmacy Prior Auth telephone encounter from 11/09/23.    PA denied because patient has not failed 2,000mg  per day of Metformin  for at least 12 weeks

## 2023-12-29 MED ORDER — METFORMIN HCL ER (MOD) 1000 MG PO TB24: 1000.0000 mg | ORAL_TABLET | Freq: Every day | ORAL | 1 refills | Status: DC

## 2023-12-29 NOTE — Telephone Encounter (Signed)
 See my chart message

## 2023-12-29 NOTE — Addendum Note (Signed)
 Addended by: Tri Chittick D on: 12/29/2023 03:38 PM   Modules accepted: Orders

## 2023-12-30 ENCOUNTER — Other Ambulatory Visit: Payer: Self-pay | Admitting: Family Medicine

## 2023-12-30 DIAGNOSIS — E1165 Type 2 diabetes mellitus with hyperglycemia: Secondary | ICD-10-CM

## 2023-12-30 MED ORDER — RYBELSUS 3 MG PO TABS: 3.0000 mg | ORAL_TABLET | Freq: Every day | ORAL | 0 refills | Status: DC

## 2023-12-30 NOTE — Telephone Encounter (Signed)
**Note De-identified  [place] Obfuscation** Please advise 

## 2024-01-04 ENCOUNTER — Other Ambulatory Visit (HOSPITAL_COMMUNITY): Payer: Self-pay

## 2024-01-04 ENCOUNTER — Telehealth: Payer: Self-pay | Admitting: Pharmacist

## 2024-01-04 NOTE — Telephone Encounter (Signed)
 Appeal has been submitted for Rybelsus . Will advise when response is received, please be advised that most companies may take 30 days to make a decision. Appeal letter and supporting documentation have been faxed to [phone] on 01/04/2024 @3 :05 pm.  Thank you, Dene Fines, PharmD Clinical Pharmacist  [place]  Direct Dial: [phone]

## 2024-01-06 ENCOUNTER — Telehealth: Payer: Self-pay

## 2024-01-06 ENCOUNTER — Other Ambulatory Visit (HOSPITAL_COMMUNITY): Payer: Self-pay

## 2024-01-06 NOTE — Telephone Encounter (Signed)
 Pharmacy Patient Advocate Encounter   Received notification from CoverMyMeds that prior authorization for metFORMIN  HCl ER (MOD) 1000MG  er tablets is required/requested.   Insurance verification completed.   The patient is insured through [employer] .   Per test claim: PA required; PA submitted to above mentioned insurance via CoverMyMeds Key/confirmation #/EOC [facility] Status is pending

## 2024-01-07 ENCOUNTER — Other Ambulatory Visit (HOSPITAL_COMMUNITY): Payer: Self-pay

## 2024-01-10 ENCOUNTER — Other Ambulatory Visit (HOSPITAL_COMMUNITY): Payer: Self-pay

## 2024-01-11 LAB — HM DIABETES EYE EXAM

## 2024-01-11 NOTE — Telephone Encounter (Signed)
 Pharmacy Patient Advocate Encounter  Received notification from [employer] that Prior Authorization for METFORMIN  ER 1000MG  TABS has been DENIED.  Full denial letter will be uploaded to the media tab. See denial reason below.   PA #/Case ID/Reference #: [id number]

## 2024-01-13 ENCOUNTER — Other Ambulatory Visit: Payer: Self-pay | Admitting: *Deleted

## 2024-01-13 DIAGNOSIS — J3089 Other allergic rhinitis: Secondary | ICD-10-CM

## 2024-01-13 DIAGNOSIS — M25562 Pain in left knee: Secondary | ICD-10-CM

## 2024-01-13 MED ORDER — GLIMEPIRIDE 2 MG PO TABS: 2.0000 mg | ORAL_TABLET | Freq: Every day | ORAL | 2 refills | Status: AC

## 2024-01-13 MED ORDER — CELECOXIB 200 MG PO CAPS: 200.0000 mg | ORAL_CAPSULE | Freq: Two times a day (BID) | ORAL | 3 refills | Status: AC

## 2024-01-13 MED ORDER — MONTELUKAST SODIUM 10 MG PO TABS: 10.0000 mg | ORAL_TABLET | Freq: Every day | ORAL | 3 refills | Status: AC

## 2024-01-13 NOTE — Telephone Encounter (Signed)
 Spoke with patient and he is unable to take metformin  due to it causing diarrhea.  Is there anything else he should take while we await appeal for Rybelsus .

## 2024-01-13 NOTE — Telephone Encounter (Signed)
 Patient is unable to take metformin  due having diarrhea.  We will await appeal for the Rybelsus 

## 2024-01-13 NOTE — Addendum Note (Signed)
 Addended by: Marylou Sobers D on: 01/13/2024 03:20 PM   Modules accepted: Orders

## 2024-01-13 NOTE — Telephone Encounter (Signed)
Pt notified and rx sent in 

## 2024-01-17 NOTE — Telephone Encounter (Signed)
 The appeal for Rybelsus  has been approved by the insurance company:       Thank you, Dene Fines, PharmD Clinical Pharmacist  [place]  Direct Dial: [phone]

## 2024-02-08 ENCOUNTER — Other Ambulatory Visit: Payer: Self-pay | Admitting: Family Medicine

## 2024-02-08 DIAGNOSIS — J3089 Other allergic rhinitis: Secondary | ICD-10-CM

## 2024-02-11 ENCOUNTER — Encounter: Payer: PRIVATE HEALTH INSURANCE | Admitting: Family Medicine

## 2024-02-21 ENCOUNTER — Encounter: Payer: PRIVATE HEALTH INSURANCE | Admitting: Family Medicine

## 2024-03-17 ENCOUNTER — Ambulatory Visit: Payer: PRIVATE HEALTH INSURANCE | Admitting: Family Medicine

## 2024-03-17 VITALS — BP 150/88 | HR 113 | Temp 98.2°F | Resp 18 | Ht 73.0 in | Wt 373.6 lb

## 2024-03-17 DIAGNOSIS — E1169 Type 2 diabetes mellitus with other specified complication: Secondary | ICD-10-CM

## 2024-03-17 DIAGNOSIS — E785 Hyperlipidemia, unspecified: Secondary | ICD-10-CM

## 2024-03-17 DIAGNOSIS — Z0000 Encounter for general adult medical examination without abnormal findings: Secondary | ICD-10-CM | POA: Diagnosis not present

## 2024-03-17 DIAGNOSIS — Z7984 Long term (current) use of oral hypoglycemic drugs: Secondary | ICD-10-CM

## 2024-03-17 DIAGNOSIS — J3089 Other allergic rhinitis: Secondary | ICD-10-CM | POA: Diagnosis not present

## 2024-03-17 DIAGNOSIS — I10 Essential (primary) hypertension: Secondary | ICD-10-CM

## 2024-03-17 DIAGNOSIS — E1165 Type 2 diabetes mellitus with hyperglycemia: Secondary | ICD-10-CM | POA: Diagnosis not present

## 2024-03-17 DIAGNOSIS — Z1211 Encounter for screening for malignant neoplasm of colon: Secondary | ICD-10-CM

## 2024-03-17 DIAGNOSIS — J309 Allergic rhinitis, unspecified: Secondary | ICD-10-CM

## 2024-03-17 LAB — MICROALBUMIN / CREATININE URINE RATIO
Creatinine,U: 127 mg/dL
Microalb Creat Ratio: 44.8 mg/g — ABNORMAL HIGH (ref 0.0–30.0)
Microalb, Ur: 5.7 mg/dL — ABNORMAL HIGH (ref 0.0–1.9)

## 2024-03-17 MED ORDER — FUROSEMIDE 20 MG PO TABS: 20.0000 mg | ORAL_TABLET | Freq: Every day | ORAL | 3 refills | Status: DC

## 2024-03-17 MED ORDER — METFORMIN HCL ER (MOD) 1000 MG PO TB24: 1000.0000 mg | ORAL_TABLET | Freq: Every day | ORAL | 1 refills | Status: DC

## 2024-03-17 MED ORDER — LOSARTAN POTASSIUM 100 MG PO TABS: 100.0000 mg | ORAL_TABLET | Freq: Every day | ORAL | 3 refills | Status: AC

## 2024-03-17 MED ORDER — METOPROLOL SUCCINATE ER 200 MG PO TB24: 200.0000 mg | ORAL_TABLET | Freq: Every day | ORAL | 1 refills | Status: AC

## 2024-03-17 MED ORDER — RYBELSUS 7 MG PO TABS
7.0000 mg | ORAL_TABLET | Freq: Every day | ORAL | 1 refills | Status: DC
Start: 2024-03-17 — End: 2024-03-22

## 2024-03-17 MED ORDER — AMLODIPINE BESYLATE 10 MG PO TABS: 10.0000 mg | ORAL_TABLET | Freq: Every day | ORAL | 0 refills | Status: DC

## 2024-03-17 MED ORDER — AZELASTINE HCL 0.1 % NA SOLN: NASAL | 5 refills | Status: AC

## 2024-03-17 MED ORDER — FLUTICASONE PROPIONATE 50 MCG/ACT NA SUSP: NASAL | 1 refills | Status: AC

## 2024-03-17 MED ORDER — FENOFIBRATE 160 MG PO TABS: 160.0000 mg | ORAL_TABLET | Freq: Every day | ORAL | 1 refills | Status: DC

## 2024-03-17 NOTE — Assessment & Plan Note (Signed)
 Encourage heart healthy diet such as MIND or DASH diet, increase exercise, avoid trans fats, simple carbohydrates and processed foods, consider a krill or fish or flaxseed oil cap daily.

## 2024-03-17 NOTE — Progress Notes (Signed)
 Subjective:    Patient ID: Jack Thomas, male    DOB: November 22, 1975, 48 y.o.   MRN: [id number]  Chief Complaint  Patient presents with   Annual Exam    Pt states not fasting     HPI Patient is in today for cpe.  Discussed the use of AI scribe software for clinical note transcription with the patient, who gave verbal consent to proceed.  History of Present Illness Jack Thomas is a 48 year old male with diabetes and hypertension who presents with left knee pain and right hip pain.  He experiences persistent pain in his left knee and right hip, with difficulty bending the knee. He uses a Scientist, clinical (histocompatibility and immunogenetics), heat pad, and knee brace for relief. Insurance does not cover a recommended gel injection, and he has avoided steroid injections due to concerns about blood sugar levels.  He manages his diabetes with Rybelsus , which effectively controls his blood sugar levels and reduces appetite. He takes it regularly and has adjusted his meal schedule to fit his work routine. His blood pressure is well-controlled with medication, and he has a 90-day supply.  He experiences nasal congestion, particularly at night, and uses Flonase  and Astelin  for relief. He also uses a CPAP machine for sleep apnea. He inquires about using Mucinex DM for congestion and confirms he is not using Mucinex D due to his blood pressure.  He reports stomach cramps while driving, described as a tightening sensation, without constipation or gas. He consumes bananas and orange juice, suspecting low potassium levels.  He recently visited the dentist and was advised to see an oral surgeon for a problematic tooth causing pain with cold or pressure. He has not yet received a referral for an oral surgeon.    Past Medical History:  Diagnosis Date   Arthritis    right knee; left shoulder   Diabetes mellitus without complication (HCC)    Hyperlipidemia    Hypertension    takes metoprolol  daily   Sleep apnea    sleep study - recommended  cpap    Past Surgical History:  Procedure Laterality Date   KNEE SURGERY  2009   right   NASAL SEPTOPLASTY W/ TURBINOPLASTY  12/03/2011   Procedure: NASAL SEPTOPLASTY WITH TURBINATE REDUCTION;  Surgeon: Ida Loader, MD;  Location: [facility] OR;  Service: ENT;  Laterality: Bilateral;   NASAL SINUS SURGERY  12/03/2011   Procedure: ENDOSCOPIC SINUS SURGERY;  Surgeon: Ida Loader, MD;  Location: [facility] OR;  Service: ENT;  Laterality: Bilateral;    Family History  Problem Relation Age of Onset   Diabetes Mother    Allergies Mother    Hyperlipidemia Father    Hypertension Father    Diabetes Father    Anesthesia problems Neg Hx    Hypotension Neg Hx    Malignant hyperthermia Neg Hx    Pseudochol deficiency Neg Hx    Colon cancer Neg Hx    Esophageal cancer Neg Hx     Social History   Socioeconomic History   Marital status: Single    Spouse name: Not on file   Number of children: 0   Years of education: Not on file   Highest education level: 12th grade  Occupational History   Occupation: Barista: [employer]  Tobacco Use   Smoking status: Some Days    Current packs/day: 0.00    Average packs/day: 0.5 packs/day for 6.0 years (3.0 ttl pk-yrs)    Types: Cigars, Cigarettes  Start date: 11/01/2004    Last attempt to quit: 11/02/2010    Years since quitting: 13.3   Smokeless tobacco: Never  Vaping Use   Vaping status: Never Used  Substance and Sexual Activity   Alcohol use: Yes    Comment: ocassionally    Drug use: No   Sexual activity: Not on file  Other Topics Concern   Not on file  Social History Narrative   Not on file   Social Drivers of Health   Financial Resource Strain: Low Risk  (03/17/2024)   Overall Financial Resource Strain (CARDIA)    Difficulty of Paying Living Expenses: Not very hard  Food Insecurity: Patient Declined (03/17/2024)   Hunger Vital Sign    Worried About Running Out of Food in the Last Year: Patient declined    Ran Out of Food in  the Last Year: Patient declined  Transportation Needs: No Transportation Needs (03/17/2024)   PRAPARE - Administrator, Civil Service (Medical): No    Lack of Transportation (Non-Medical): No  Physical Activity: Insufficiently Active (03/17/2024)   Exercise Vital Sign    Days of Exercise per Week: 2 days    Minutes of Exercise per Session: 10 min  Stress: Patient Declined (03/17/2024)   [employer] of Occupational Health - Occupational Stress Questionnaire    Feeling of Stress: Patient declined  Social Connections: Moderately Isolated (03/17/2024)   Social Connection and Isolation Panel    Frequency of Communication with Friends and Family: More than three times a week    Frequency of Social Gatherings with Friends and Family: Once a week    Attends Religious Services: 1 to 4 times per year    Active Member of [employer] or Organizations: No    Attends Engineer, structural: Not on file    Marital Status: Never married  Intimate Partner Violence: Not At Risk (01/14/2023)   Received from [facility]   HITS    Over the last 12 months how often did your partner physically hurt you?: Never    Over the last 12 months how often did your partner insult you or talk down to you?: Never    Over the last 12 months how often did your partner threaten you with physical harm?: Never    Over the last 12 months how often did your partner scream or curse at you?: Never    Outpatient Medications Prior to Visit  Medication Sig Dispense Refill   Blood Glucose Monitoring Suppl DEVI 1 each by Does not apply route in the morning, at noon, and at bedtime. May substitute to any manufacturer covered by patient's insurance. 1 each 0   celecoxib  (CELEBREX ) 200 MG capsule Take 1 capsule (200 mg total) by mouth 2 (two) times daily. 60 capsule 3   chlorthalidone  (HYGROTON ) 25 MG tablet Take 1 tablet (25 mg total) by mouth daily. 90 tablet 3   diazepam  (VALIUM ) 5 MG tablet Take 1 tablet (5 mg  total) by mouth every 6 (six) hours as needed for anxiety. 30 tablet 0   gabapentin  (NEURONTIN ) 300 MG capsule Take 1 capsule (300 mg total) by mouth 2 (two) times daily. 180 capsule 0   glimepiride  (AMARYL ) 2 MG tablet Take 1 tablet (2 mg total) by mouth daily before breakfast. 30 tablet 2   glucose blood (ONETOUCH VERIO) test strip Check blood sugars once daily and as needed. 100 each 12   montelukast  (SINGULAIR ) 10 MG tablet Take 1 tablet (10 mg total)  by mouth at bedtime. 90 tablet 3   ONETOUCH DELICA LANCETS 33G MISC Check blood sugars once daily and as needed. 100 each 12   rosuvastatin  (CRESTOR ) 40 MG tablet Take 1 tablet (40 mg total) by mouth daily. 90 tablet 1   amLODipine  (NORVASC ) 10 MG tablet Take 1 tablet (10 mg total) by mouth daily. 90 tablet 0   azelastine  (ASTELIN ) 0.1 % nasal spray USE 1 SPRAY IN EACH NOSTRIL ONCE DAILY EVERY NIGHT BEFORE CPAP USE 30 mL 5   fenofibrate  160 MG tablet Take 1 tablet (160 mg total) by mouth daily. 90 tablet 0   fluticasone  (FLONASE ) 50 MCG/ACT nasal spray USE 2 SPRAYS IN EACH NOSTRIL ONCE DAILY EACH NIGHT BEFORE CPAP USE. 48 g 1   furosemide  (LASIX ) 20 MG tablet Take 1 tablet (20 mg total) by mouth daily. 90 tablet 0   losartan  (COZAAR ) 100 MG tablet Take 1 tablet (100 mg total) by mouth daily. 90 tablet 3   metFORMIN  (GLUMETZA ) 1000 MG (MOD) 24 hr tablet Take 1 tablet (1,000 mg total) by mouth daily with breakfast. 30 tablet 1   metoprolol  (TOPROL -XL) 200 MG 24 hr tablet Take 1 tablet (200 mg total) by mouth daily. Take with or immediately following a meal 90 tablet 0   Semaglutide  (RYBELSUS ) 3 MG TABS Take 1 tablet (3 mg total) by mouth daily. Pt did not tolerate metformin  30 tablet 0   Facility-Administered Medications Prior to Visit  Medication Dose Route Frequency Provider Last Rate Last Admin   dexamethasone  (DECADRON ) injection 15 mg  15 mg Other Once Newton, Frederic, MD        No Known Allergies  Review of Systems  Constitutional:   Negative for fever and malaise/fatigue.  HENT:  Positive for congestion.   Eyes:  Negative for blurred vision.  Respiratory:  Negative for cough and shortness of breath.   Cardiovascular:  Negative for chest pain, palpitations and leg swelling.  Gastrointestinal:  Negative for abdominal pain, blood in stool, nausea and vomiting.  Genitourinary:  Negative for dysuria and frequency.  Musculoskeletal:  Negative for back pain and falls.  Skin:  Negative for rash.  Neurological:  Negative for dizziness, loss of consciousness and headaches.  Endo/Heme/Allergies:  Negative for environmental allergies.  Psychiatric/Behavioral:  Negative for depression. The patient is not nervous/anxious.        Objective:    Physical Exam Vitals and nursing note reviewed.  Constitutional:      General: He is not in acute distress.    Appearance: Normal appearance. He is well-developed.  HENT:     Head: Normocephalic and atraumatic.     Right Ear: Tympanic membrane, ear canal and external ear normal. There is no impacted cerumen.     Left Ear: Tympanic membrane, ear canal and external ear normal. There is no impacted cerumen.     Nose: Nose normal.     Mouth/Throat:     Mouth: Mucous membranes are moist.     Pharynx: Oropharynx is clear. No oropharyngeal exudate or posterior oropharyngeal erythema.  Eyes:     General: No scleral icterus.       Right eye: No discharge.        Left eye: No discharge.     Conjunctiva/sclera: Conjunctivae normal.     Pupils: Pupils are equal, round, and reactive to light.  Neck:     Thyroid : No thyromegaly.     Vascular: No JVD.  Cardiovascular:     Rate and Rhythm:  Normal rate and regular rhythm.     Heart sounds: Normal heart sounds. No murmur heard. Pulmonary:     Effort: Pulmonary effort is normal. No respiratory distress.     Breath sounds: Normal breath sounds.  Abdominal:     General: Bowel sounds are normal. There is no distension.     Palpations: Abdomen is  soft. There is no mass.     Tenderness: There is no abdominal tenderness. There is no guarding or rebound.  Musculoskeletal:        General: Normal range of motion.     Cervical back: Normal range of motion and neck supple.     Right lower leg: No edema.     Left lower leg: No edema.  Lymphadenopathy:     Cervical: No cervical adenopathy.  Skin:    General: Skin is warm and dry.     Findings: No erythema or rash.  Neurological:     Mental Status: He is alert and oriented to person, place, and time.     Cranial Nerves: No cranial nerve deficit.     Motor: No abnormal muscle tone.     Deep Tendon Reflexes: Reflexes are normal and symmetric. Reflexes normal.  Psychiatric:        Mood and Affect: Mood normal.        Behavior: Behavior normal.        Thought Content: Thought content normal.        Judgment: Judgment normal.     BP (!) 150/88 (BP Location: Left Arm, Patient Position: Sitting, Cuff Size: Large)   Pulse (!) 113   Temp 98.2 F (36.8 C) (Oral)   Resp 18   Ht 6' 1 (1.854 m)   Wt (!) 373 lb 9.6 oz (169.5 kg)   SpO2 94%   BMI 49.29 kg/m  Wt Readings from Last 3 Encounters:  03/17/24 (!) 373 lb 9.6 oz (169.5 kg)  11/05/23 (!) 367 lb 6.4 oz (166.7 kg)  12/11/22 (!) 373 lb 3.2 oz (169.3 kg)    Diabetic Foot Exam - Simple   No data filed    Lab Results  Component Value Date   WBC 12.4 (H) 11/05/2023   HGB 14.9 11/05/2023   HCT 45.0 11/05/2023   PLT 236.0 11/05/2023   GLUCOSE 230 (H) 11/05/2023   CHOL 207 (H) 11/05/2023   TRIG (H) 11/05/2023    508.0 Triglyceride is over 400; calculations on Lipids are invalid.   HDL 36.50 (L) 11/05/2023   LDLDIRECT 104.0 11/05/2023   LDLCALC  12/11/2022     Comment:     . LDL cholesterol not calculated. Triglyceride levels greater than 400 mg/dL invalidate calculated LDL results. . Reference range: <100 . Desirable range <100 mg/dL for primary prevention;   <70 mg/dL for patients with CHD or diabetic patients  with  > or = 2 CHD risk factors. SABRA LDL-C is now calculated using the Martin-Hopkins  calculation, which is a validated novel method providing  better accuracy than the Friedewald equation in the  estimation of LDL-C.  Gladis APPLETHWAITE et al. SANDREA. 7986;689(80): 2061-2068  (http://education.[facility].com/faq/FAQ164)    ALT 21 11/05/2023   AST 22 11/05/2023   NA 139 11/05/2023   K 4.0 11/05/2023   CL 100 11/05/2023   CREATININE 0.83 11/05/2023   BUN 12 11/05/2023   CO2 28 11/05/2023   TSH 1.58 10/25/2020   PSA 0.29 10/25/2020   INR 0.93 01/16/2013   HGBA1C 9.4 (H) 11/05/2023   MICROALBUR  4.5 (H) 11/05/2023    Lab Results  Component Value Date   TSH 1.58 10/25/2020   Lab Results  Component Value Date   WBC 12.4 (H) 11/05/2023   HGB 14.9 11/05/2023   HCT 45.0 11/05/2023   MCV 95.2 11/05/2023   PLT 236.0 11/05/2023   Lab Results  Component Value Date   NA 139 11/05/2023   K 4.0 11/05/2023   CO2 28 11/05/2023   GLUCOSE 230 (H) 11/05/2023   BUN 12 11/05/2023   CREATININE 0.83 11/05/2023   BILITOT 0.6 11/05/2023   ALKPHOS 70 11/05/2023   AST 22 11/05/2023   ALT 21 11/05/2023   PROT 7.4 11/05/2023   ALBUMIN 4.5 11/05/2023   CALCIUM  9.5 11/05/2023   GFR 105.65 11/05/2023   Lab Results  Component Value Date   CHOL 207 (H) 11/05/2023   Lab Results  Component Value Date   HDL 36.50 (L) 11/05/2023   Lab Results  Component Value Date   [facility]  12/11/2022     Comment:     . LDL cholesterol not calculated. Triglyceride levels greater than 400 mg/dL invalidate calculated LDL results. . Reference range: <100 . Desirable range <100 mg/dL for primary prevention;   <70 mg/dL for patients with CHD or diabetic patients  with > or = 2 CHD risk factors. SABRA LDL-C is now calculated using the Martin-Hopkins  calculation, which is a validated novel method providing  better accuracy than the Friedewald equation in the  estimation of LDL-C.  Gladis APPLETHWAITE et al. SANDREA.  7986;689(80): 2061-2068  (http://education.[facility].com/faq/FAQ164)    Lab Results  Component Value Date   TRIG (H) 11/05/2023    508.0 Triglyceride is over 400; calculations on Lipids are invalid.   Lab Results  Component Value Date   CHOLHDL 6 11/05/2023   Lab Results  Component Value Date   HGBA1C 9.4 (H) 11/05/2023       Assessment & Plan:  Preventative health care Assessment & Plan: Ghm utd Check labs  See AVS Health Maintenance  Topic Date Due   Hepatitis B Vaccines 19-59 Average Risk (1 of 3 - 19+ 3-dose series) Never done   HPV VACCINES (1 - Risk 3-dose SCDM series) Never done   Pneumococcal Vaccine (2 of 2 - PCV) 06/10/2018   DTaP/Tdap/Td (3 - Td or Tdap) 01/17/2023   COVID-19 Vaccine (3 - 2024-25 season) 04/04/2023   Colonoscopy  Never done   FOOT EXAM  12/11/2023   INFLUENZA VACCINE  03/03/2024   HEMOGLOBIN A1C  05/06/2024   Diabetic kidney evaluation - eGFR measurement  11/04/2024   Diabetic kidney evaluation - Urine ACR  11/04/2024   OPHTHALMOLOGY EXAM  01/10/2025   Hepatitis C Screening  Completed   HIV Screening  Completed   Meningococcal B Vaccine  Aged Out     Orders: -     PSA  Type 2 diabetes mellitus with hyperglycemia, without long-term current use of insulin (HCC) -     Rybelsus ; Take 1 tablet (7 mg total) by mouth daily.  Dispense: 90 tablet; Refill: 1 -     metFORMIN  HCl ER (MOD); Take 1 tablet (1,000 mg total) by mouth daily with breakfast.  Dispense: 90 tablet; Refill: 1 -     Microalbumin / creatinine urine ratio  Essential hypertension Assessment & Plan: Well controlled, no changes to meds. Encouraged heart healthy diet such as the DASH diet and exercise as tolerated.    Orders: -     amLODIPine  Besylate; Take 1 tablet (  10 mg total) by mouth daily.  Dispense: 90 tablet; Refill: 0 -     Furosemide ; Take 1 tablet (20 mg total) by mouth daily.  Dispense: 90 tablet; Refill: 3 -     Losartan  Potassium; Take 1 tablet (100 mg  total) by mouth daily.  Dispense: 90 tablet; Refill: 3 -     metFORMIN  HCl ER (MOD); Take 1 tablet (1,000 mg total) by mouth daily with breakfast.  Dispense: 90 tablet; Refill: 1 -     Metoprolol  Succinate ER; Take 1 tablet (200 mg total) by mouth daily. Take with or immediately following a meal  Dispense: 90 tablet; Refill: 1 -     CBC with Differential/Platelet -     Comprehensive metabolic panel with GFR -     Hemoglobin A1c  Environmental and seasonal allergies -     Azelastine  HCl; USE 1 SPRAY IN EACH NOSTRIL ONCE DAILY EVERY NIGHT BEFORE CPAP USE  Dispense: 30 mL; Refill: 5  Hyperlipidemia associated with type 2 diabetes mellitus (HCC) Assessment & Plan: Encourage heart healthy diet such as MIND or DASH diet, increase exercise, avoid trans fats, simple carbohydrates and processed foods, consider a krill or fish or flaxseed oil cap daily.    Orders: -     Fenofibrate ; Take 1 tablet (160 mg total) by mouth daily.  Dispense: 90 tablet; Refill: 1 -     Lipid panel  Chronic allergic rhinitis -     Fluticasone  Propionate; USE 2 SPRAYS IN EACH NOSTRIL ONCE DAILY EACH NIGHT BEFORE CPAP USE.  Dispense: 48 g; Refill: 1  Colon cancer screening -     Ambulatory referral to Gastroenterology   Assessment and Plan Assessment & Plan Left knee pain   Chronic left knee pain with limited range of motion. Intra-articular gel injection was recommended but not covered by insurance. Steroid injection was avoided due to blood sugar concerns. He uses a Scientist, clinical (histocompatibility and immunogenetics) and heat pad for relief and wears a knee brace. Continue using the massager, heat pad, and knee brace. Follow up with the doctor in [place] on December 10th.  Right hip pain   Right hip pain likely due to altered gait from left knee pain.  Type 2 diabetes mellitus   Type 2 diabetes mellitus is managed with Rybelsus , and blood sugar levels are well-controlled. He drinks flavored water for hydration. Continue Rybelsus  and monitor blood sugar  levels regularly.  Hypertension   Hypertension is managed with medication. He avoids Mucinex D due to blood pressure concerns. Continue the current antihypertensive regimen and avoid Mucinex D.  Hyperlipidemia   Hyperlipidemia is managed with rosuvastatin . Continue rosuvastatin  as prescribed.  Allergic rhinitis   Allergic rhinitis causes nasal congestion, especially at night. He uses Flonase  and Astelin  and is considering Mucinex DM for additional relief. Continue Flonase  and Astelin . Use Mucinex DM as needed.  Obstructive sleep apnea   Obstructive sleep apnea is managed with CPAP.  Dental pain, upper tooth   Dental pain in the upper tooth is exacerbated by cold and pressure. He prefers extraction over a root canal. Refer to oral surgeon Glendia Mo in [place] for evaluation and possible extraction.  Peripheral neuropathy   Peripheral neuropathy is managed with gabapentin . Continue gabapentin  as prescribed.    Linnette Panella R Lowne Chase, DO

## 2024-03-17 NOTE — Assessment & Plan Note (Signed)
 Ghm utd Check labs  See AVS Health Maintenance  Topic Date Due   Hepatitis B Vaccines 19-59 Average Risk (1 of 3 - 19+ 3-dose series) Never done   HPV VACCINES (1 - Risk 3-dose SCDM series) Never done   Pneumococcal Vaccine (2 of 2 - PCV) 06/10/2018   DTaP/Tdap/Td (3 - Td or Tdap) 01/17/2023   COVID-19 Vaccine (3 - 2024-25 season) 04/04/2023   Colonoscopy  Never done   FOOT EXAM  12/11/2023   INFLUENZA VACCINE  03/03/2024   HEMOGLOBIN A1C  05/06/2024   Diabetic kidney evaluation - eGFR measurement  11/04/2024   Diabetic kidney evaluation - Urine ACR  11/04/2024   OPHTHALMOLOGY EXAM  01/10/2025   Hepatitis C Screening  Completed   HIV Screening  Completed   Meningococcal B Vaccine  Aged Out

## 2024-03-17 NOTE — Assessment & Plan Note (Signed)
 Well controlled, no changes to meds. Encouraged heart healthy diet such as the DASH diet and exercise as tolerated.

## 2024-03-18 LAB — COMPREHENSIVE METABOLIC PANEL WITH GFR
AG Ratio: 1.3 (calc) (ref 1.0–2.5)
ALT: 20 U/L (ref 9–46)
AST: 22 U/L (ref 10–40)
Albumin: 4.3 g/dL (ref 3.6–5.1)
Alkaline phosphatase (APISO): 59 U/L (ref 36–130)
BUN: 11 mg/dL (ref 7–25)
CO2: 27 mmol/L (ref 20–32)
Calcium: 9.6 mg/dL (ref 8.6–10.3)
Chloride: 105 mmol/L (ref 98–110)
Creat: 0.94 mg/dL (ref 0.60–1.29)
Globulin: 3.2 g/dL (ref 1.9–3.7)
Glucose, Bld: 177 mg/dL — ABNORMAL HIGH (ref 65–99)
Potassium: 3.9 mmol/L (ref 3.5–5.3)
Sodium: 140 mmol/L (ref 135–146)
Total Bilirubin: 0.6 mg/dL (ref 0.2–1.2)
Total Protein: 7.5 g/dL (ref 6.1–8.1)
eGFR: 102 mL/min/1.73m2 (ref >=60)

## 2024-03-18 LAB — HEMOGLOBIN A1C
Hgb A1c MFr Bld: 7.9 % — ABNORMAL HIGH (ref <5.7)
Mean Plasma Glucose: 180 mg/dL
eAG (mmol/L): 10 mmol/L

## 2024-03-18 LAB — CBC WITH DIFFERENTIAL/PLATELET
Absolute Lymphocytes: 3416 {cells}/uL (ref 850–3900)
Absolute Monocytes: 891 {cells}/uL (ref 200–950)
Basophils Absolute: 73 {cells}/uL (ref 0–200)
Basophils Relative: 0.6 %
Eosinophils Absolute: 256 {cells}/uL (ref 15–500)
Eosinophils Relative: 2.1 %
HCT: 44.6 % (ref 38.5–50.0)
Hemoglobin: 14.7 g/dL (ref 13.2–17.1)
MCH: 31.5 pg (ref 27.0–33.0)
MCHC: 33 g/dL (ref 32.0–36.0)
MCV: 95.7 fL (ref 80.0–100.0)
MPV: 11.5 fL (ref 7.5–12.5)
Monocytes Relative: 7.3 %
Neutro Abs: 7564 {cells}/uL (ref 1500–7800)
Neutrophils Relative %: 62 %
Platelets: 254 Thousand/uL (ref 140–400)
RBC: 4.66 Million/uL (ref 4.20–5.80)
RDW: 11.7 % (ref 11.0–15.0)
Total Lymphocyte: 28 %
WBC: 12.2 Thousand/uL — ABNORMAL HIGH (ref 3.8–10.8)

## 2024-03-18 LAB — LIPID PANEL
Cholesterol: 200 mg/dL — ABNORMAL HIGH (ref <200)
HDL: 34 mg/dL — ABNORMAL LOW (ref >=40)
Non-HDL Cholesterol (Calc): 166 mg/dL — ABNORMAL HIGH (ref <130)
Total CHOL/HDL Ratio: 5.9 (calc) — ABNORMAL HIGH (ref <5.0)
Triglycerides: 463 mg/dL — ABNORMAL HIGH (ref <150)

## 2024-03-18 LAB — PSA: PSA: 0.45 ng/mL (ref <=4.00)

## 2024-03-22 ENCOUNTER — Ambulatory Visit: Payer: Self-pay | Admitting: Family Medicine

## 2024-03-22 ENCOUNTER — Other Ambulatory Visit: Payer: Self-pay

## 2024-03-22 MED ORDER — EZETIMIBE 10 MG PO TABS: 10.0000 mg | ORAL_TABLET | Freq: Every day | ORAL | 2 refills | Status: AC

## 2024-03-22 MED ORDER — RYBELSUS 14 MG PO TABS
14.0000 mg | ORAL_TABLET | Freq: Every day | ORAL | 2 refills | Status: AC
Start: 2024-03-22 — End: ?

## 2024-06-05 ENCOUNTER — Encounter: Payer: Self-pay | Admitting: Radiology

## 2024-06-14 ENCOUNTER — Encounter: Payer: Self-pay | Admitting: Internal Medicine

## 2024-08-30 ENCOUNTER — Other Ambulatory Visit: Payer: Self-pay | Admitting: Family Medicine

## 2024-08-30 DIAGNOSIS — I10 Essential (primary) hypertension: Secondary | ICD-10-CM

## 2024-08-30 DIAGNOSIS — E1169 Type 2 diabetes mellitus with other specified complication: Secondary | ICD-10-CM
